# Patient Record
Sex: Female | Born: 1950
Health system: Southern US, Community
[De-identification: ages and names within clinical notes are randomized; demographics above are authoritative.]

## PROBLEM LIST (undated history)

## (undated) DIAGNOSIS — Z972 Presence of dental prosthetic device (complete) (partial): Secondary | ICD-10-CM

## (undated) DIAGNOSIS — I10 Essential (primary) hypertension: Secondary | ICD-10-CM

## (undated) DIAGNOSIS — K8020 Calculus of gallbladder without cholecystitis without obstruction: Secondary | ICD-10-CM

## (undated) DIAGNOSIS — K219 Gastro-esophageal reflux disease without esophagitis: Secondary | ICD-10-CM

## (undated) DIAGNOSIS — E785 Hyperlipidemia, unspecified: Secondary | ICD-10-CM

## (undated) DIAGNOSIS — E119 Type 2 diabetes mellitus without complications: Secondary | ICD-10-CM

## (undated) HISTORY — DX: Hyperlipidemia, unspecified: E78.5

## (undated) HISTORY — DX: Essential (primary) hypertension: I10

---

## 2007-11-12 ENCOUNTER — Emergency Department (HOSPITAL_COMMUNITY): Admission: EM | Admit: 2007-11-12 | Discharge: 2007-11-12 | Payer: Self-pay | Admitting: Emergency Medicine

## 2007-11-25 ENCOUNTER — Emergency Department (HOSPITAL_COMMUNITY): Admission: EM | Admit: 2007-11-25 | Discharge: 2007-11-26 | Payer: Self-pay | Admitting: Emergency Medicine

## 2008-01-03 ENCOUNTER — Encounter: Payer: Self-pay | Admitting: Internal Medicine

## 2008-01-17 ENCOUNTER — Encounter: Payer: Self-pay | Admitting: Internal Medicine

## 2008-02-10 ENCOUNTER — Ambulatory Visit: Payer: Self-pay | Admitting: Internal Medicine

## 2008-02-28 ENCOUNTER — Ambulatory Visit: Payer: Self-pay | Admitting: Internal Medicine

## 2009-01-11 ENCOUNTER — Ambulatory Visit (HOSPITAL_COMMUNITY): Admission: RE | Admit: 2009-01-11 | Discharge: 2009-01-11 | Payer: Self-pay | Admitting: Obstetrics and Gynecology

## 2009-01-11 ENCOUNTER — Encounter (INDEPENDENT_AMBULATORY_CARE_PROVIDER_SITE_OTHER): Payer: Self-pay | Admitting: Obstetrics and Gynecology

## 2009-01-11 HISTORY — PX: OTHER SURGICAL HISTORY: SHX169

## 2010-11-11 LAB — CBC
Hemoglobin: 13.6 g/dL (ref 12.0–15.0)
Platelets: 283 10*3/uL (ref 150–400)
RDW: 13.8 % (ref 11.5–15.5)
WBC: 8.8 10*3/uL (ref 4.0–10.5)

## 2010-11-11 LAB — BASIC METABOLIC PANEL
Calcium: 10.4 mg/dL (ref 8.4–10.5)
GFR calc non Af Amer: >60 mL/min (ref >60)
Glucose, Bld: 81 mg/dL (ref 70–99)
Potassium: 4.1 mEq/L (ref 3.5–5.1)
Sodium: 138 mEq/L (ref 135–145)

## 2010-12-17 NOTE — Op Note (Signed)
NAME:  Kimberly Welch, Kimberly Welch                 ACCOUNT NO.:  [id number]   MEDICAL RECORD NO.:  [id number]          PATIENT TYPE:  AMB   LOCATION:  SDC                           FACILITY:  [facility]   PHYSICIAN:  Hal Morales, M.D.DATE OF BIRTH:  1951-02-11   DATE OF PROCEDURE:  01/11/2009  DATE OF DISCHARGE:                               OPERATIVE REPORT   PREOPERATIVE DIAGNOSES:  Postmenopausal bleeding, endometrial polyps.   POSTOPERATIVE DIAGNOSES:  Postmenopausal bleeding, endometrial polyps.   OPERATIONS:  Hysteroscopy, resection of endometrial polyps, and uterine  curettage.   SURGEON:  Hal Morales, MD   ANESTHESIA:  General LMA and local.   ESTIMATED BLOOD LOSS:  Less than 10 mL.   COMPLICATIONS:  None.   FINDINGS:  The endometrial cavity was primarily atrophic; however, there  were 3 small endometrial polyps that were noted.   PROCEDURE IN DETAIL:  The patient was taken to the operating room after  appropriate identification and after placement of patient on the  operating table, general anesthesia was induced.  After that induction,  the patient was placed in lithotomy position and the perineum and vagina  were prepped with multiple layers of Betadine.  A red Robinson catheter  was used to empty the bladder and the perineum draped as a sterile  field.  A speculum was placed in the vagina and a paracervical block  achieved with a total of 10 mL of 2% Xylocaine in the 5 and 7 o'clock  positions.  The cervix was grasped with a tenaculum and the uterus  sounded to 9 cm.  The cervix was then dilated to accommodate the  diagnostic hysteroscope, which allowed visualization and documentation  of the above-noted findings.  The cervix was further dilated to  accommodate the operative hysteroscope which was then used to resect the  3 endometrial polyps.  The instruments were removed from the uterus and  a sharp curette was used to obtain minimal curettings.  The endometrial  cavity  was again visualized with the 3 endometrial polyps having been  resected.  All instruments were then removed from the endometrial cavity  and hemostasis noted to be adequate.  The patient was awakened from  general anesthesia and taken to the recovery room in satisfactory  condition having tolerated the procedure well with sponge and instrument  counts correct.   SPECIMENS TO PATHOLOGY:  Endometrial curettings and endometrial polyps.   DISCHARGE INSTRUCTIONS:  Printed instructions from [facility] for  [facility].   DISCHARGE MEDICATIONS:  Preoperative medications plus ibuprofen over-the-  counter 600 mg p.o. q.6 h. p.r.n.   FOLLOWUP INSTRUCTIONS:  The patient is to follow up in 2 weeks.      Hal Morales, M.D.  Electronically Signed     VPH/MEDQ  D:  01/11/2009  T:  01/12/2009  Job:  [id number]

## 2011-04-29 LAB — POCT I-STAT, CHEM 8
Calcium, Ion: 1.18
Chloride: 104
HCT: 40
Potassium: 3.8
Sodium: 141

## 2011-04-29 LAB — POCT CARDIAC MARKERS
CKMB, poc: <1 — ABNORMAL LOW
Myoglobin, poc: 46.9
Operator id: 282201
Operator id: 270651
Troponin i, poc: <0.05

## 2011-04-29 LAB — URINALYSIS, ROUTINE W REFLEX MICROSCOPIC
Bilirubin Urine: NEGATIVE
Hgb urine dipstick: NEGATIVE
Ketones, ur: NEGATIVE
Nitrite: NEGATIVE
Nitrite: NEGATIVE
Specific Gravity, Urine: 1.017
Urobilinogen, UA: 2 — ABNORMAL HIGH
pH: 8
pH: 6

## 2011-04-29 LAB — BASIC METABOLIC PANEL
BUN: 11
GFR calc non Af Amer: >60
Glucose, Bld: 131 — ABNORMAL HIGH
Potassium: 4.2

## 2011-04-29 LAB — DIFFERENTIAL
Basophils Absolute: 0
Basophils Absolute: 0.1
Basophils Relative: 0
Basophils Relative: 1
Eosinophils Absolute: 0.1
Eosinophils Relative: 1
Lymphocytes Relative: 32
Neutro Abs: 7.9 — ABNORMAL HIGH
Neutrophils Relative %: 72

## 2011-04-29 LAB — URINE MICROSCOPIC-ADD ON

## 2011-04-29 LAB — CBC
HCT: 37
MCHC: 33.1
MCV: 85.6
Platelets: 363
Platelets: 300
RDW: 13.9
RDW: 13.4

## 2012-08-31 ENCOUNTER — Telehealth: Payer: Self-pay | Admitting: Obstetrics and Gynecology

## 2012-10-25 ENCOUNTER — Other Ambulatory Visit: Payer: Self-pay | Admitting: Obstetrics and Gynecology

## 2012-10-26 LAB — PAP IG AND HPV HIGH-RISK

## 2014-04-29 ENCOUNTER — Encounter: Payer: Self-pay | Admitting: Internal Medicine

## 2014-07-04 ENCOUNTER — Encounter: Payer: Self-pay | Admitting: Internal Medicine

## 2014-07-04 ENCOUNTER — Other Ambulatory Visit (INDEPENDENT_AMBULATORY_CARE_PROVIDER_SITE_OTHER): Payer: BC Managed Care – PPO

## 2014-07-04 ENCOUNTER — Ambulatory Visit (INDEPENDENT_AMBULATORY_CARE_PROVIDER_SITE_OTHER): Payer: BC Managed Care – PPO | Admitting: Internal Medicine

## 2014-07-04 VITALS — BP 120/84 | HR 90 | Temp 97.5°F | Ht <= 58 in | Wt 156.0 lb

## 2014-07-04 DIAGNOSIS — E119 Type 2 diabetes mellitus without complications: Secondary | ICD-10-CM

## 2014-07-04 DIAGNOSIS — R002 Palpitations: Secondary | ICD-10-CM

## 2014-07-04 DIAGNOSIS — I10 Essential (primary) hypertension: Secondary | ICD-10-CM

## 2014-07-04 DIAGNOSIS — E669 Obesity, unspecified: Secondary | ICD-10-CM

## 2014-07-04 DIAGNOSIS — E1169 Type 2 diabetes mellitus with other specified complication: Secondary | ICD-10-CM

## 2014-07-04 DIAGNOSIS — E785 Hyperlipidemia, unspecified: Secondary | ICD-10-CM

## 2014-07-04 DIAGNOSIS — Z0000 Encounter for general adult medical examination without abnormal findings: Secondary | ICD-10-CM

## 2014-07-04 LAB — HEMOGLOBIN A1C: Hgb A1c MFr Bld: 6.7 % — ABNORMAL HIGH (ref 4.6–6.5)

## 2014-07-04 NOTE — Patient Instructions (Signed)
We have checked to her EKG, which is a test that shows [place] the heart. It did not show any signs of strain or stress on your heart.  We are also going to check blood work today to check on your diabetes and your cholesterol.  We will call you back with those results  We will see you back in about 6 months to check in on your health. If you have any problems or questions before that time please feel free to call our office.  Exercise to Lose Weight Exercise and a healthy diet may help you lose weight. Your doctor may suggest specific exercises. EXERCISE IDEAS AND TIPS  Choose low-cost things you enjoy doing, such as walking, bicycling, or exercising to workout videos.  Take stairs instead of the elevator.  Walk during your lunch break.  Park your car further away from work or school.  Go to a gym or an exercise class.  Start with 5 to 10 minutes of exercise each day. Build up to 30 minutes of exercise 4 to 6 days a week.  Wear shoes with good support and comfortable clothes.  Stretch before and after working out.  Work out until you breathe harder and your heart beats faster.  Drink extra water when you exercise.  Do not do so much that you hurt yourself, feel dizzy, or get very short of breath. Exercises that burn about 150 calories:  Running 1  miles in 15 minutes.  Playing volleyball for 45 to 60 minutes.  Washing and waxing a car for 45 to 60 minutes.  Playing touch football for 45 minutes.  Walking 1  miles in 35 minutes.  Pushing a stroller 1  miles in 30 minutes.  Playing basketball for 30 minutes.  Raking leaves for 30 minutes.  Bicycling 5 miles in 30 minutes.  Walking 2 miles in 30 minutes.  Dancing for 30 minutes.  Shoveling snow for 15 minutes.  Swimming laps for 20 minutes.  Walking up stairs for 15 minutes.  Bicycling 4 miles in 15 minutes.  Gardening for 30 to 45 minutes.  Jumping rope for 15 minutes.  Washing windows or floors for 45  to 60 minutes. Document Released: 08/23/2010 Document Revised: 10/13/2011 Document Reviewed: 08/23/2010 [facility] Patient Information 2015 [place], [place]. This information is not intended to replace advice given to you by your health care provider. Make sure you discuss any questions you have with your health care provider.

## 2014-07-04 NOTE — Progress Notes (Signed)
Pre visit review using our clinic review tool, if applicable. No additional management support is needed unless otherwise documented below in the visit note. 

## 2014-07-05 DIAGNOSIS — E785 Hyperlipidemia, unspecified: Secondary | ICD-10-CM

## 2014-07-05 DIAGNOSIS — Z1283 Encounter for screening for malignant neoplasm of skin: Secondary | ICD-10-CM | POA: Insufficient documentation

## 2014-07-05 DIAGNOSIS — Z0000 Encounter for general adult medical examination without abnormal findings: Secondary | ICD-10-CM | POA: Insufficient documentation

## 2014-07-05 DIAGNOSIS — E1169 Type 2 diabetes mellitus with other specified complication: Secondary | ICD-10-CM | POA: Insufficient documentation

## 2014-07-05 DIAGNOSIS — I152 Hypertension secondary to endocrine disorders: Secondary | ICD-10-CM | POA: Insufficient documentation

## 2014-07-05 DIAGNOSIS — R002 Palpitations: Secondary | ICD-10-CM

## 2014-07-05 DIAGNOSIS — E119 Type 2 diabetes mellitus without complications: Secondary | ICD-10-CM | POA: Insufficient documentation

## 2014-07-05 DIAGNOSIS — I10 Essential (primary) hypertension: Secondary | ICD-10-CM | POA: Insufficient documentation

## 2014-07-05 DIAGNOSIS — E669 Obesity, unspecified: Secondary | ICD-10-CM

## 2014-07-05 HISTORY — DX: Palpitations: R00.2

## 2014-07-05 LAB — BASIC METABOLIC PANEL
BUN: 18 mg/dL (ref 6–23)
CALCIUM: 9.5 mg/dL (ref 8.4–10.5)
CO2: 26 meq/L (ref 19–32)
CREATININE: 1 mg/dL (ref 0.4–1.2)
Chloride: 101 mEq/L (ref 96–112)
GFR: 71.81 mL/min (ref >60.00)
Glucose, Bld: 91 mg/dL (ref 70–99)
Potassium: 3.7 mEq/L (ref 3.5–5.1)
SODIUM: 136 meq/L (ref 135–145)

## 2014-07-05 LAB — LIPID PANEL
CHOL/HDL RATIO: 5
Cholesterol: 170 mg/dL (ref 0–200)
HDL: 35.9 mg/dL — AB (ref >39.00)
LDL CALC: 109 mg/dL — AB (ref 0–99)
NONHDL: 134.1
Triglycerides: 126 mg/dL (ref 0.0–149.0)
VLDL: 25.2 mg/dL (ref 0.0–40.0)

## 2014-07-05 NOTE — Assessment & Plan Note (Signed)
On ARB, currently taking metformin. Foot exam done today. Reminded about eye exam. Check hemoglobin A1c.

## 2014-07-05 NOTE — Assessment & Plan Note (Signed)
She is currently taking Pravachol and will check lipid panel today. May need to adjust therapy.

## 2014-07-05 NOTE — Progress Notes (Signed)
   Subjective:    Patient ID: Kimberly Welch, female    DOB: [birth year], 63 y.o.   MRN: [id number]  HPI The patient is a 62 old female comes in today to establish care. She does have asthma, history of hypertension, type 2 diabetes, GERD. She is doing well overall although is about to have some dental surgery done which is why she is currently taking doxycycline. She denies any fevers, chills, chest pain, shortness of breath, abdominal pain, constipation, diarrhea. She is overweight and feels that she needs to exercise more but isn't able to do it right now. She does occasionally get what she feels to be early beats. She does not have any pain associated with those and they do not come very often. They can come at rest or with activity and are not predictable.  Review of Systems  Constitutional: Negative for fever, activity change, appetite change, fatigue and unexpected weight change.  HENT: Negative.   Eyes: Negative.   Respiratory: Negative for cough, chest tightness, shortness of breath and wheezing.   Cardiovascular: Positive for palpitations. Negative for chest pain and leg swelling.  Gastrointestinal: Negative for nausea, abdominal pain, diarrhea, constipation and abdominal distention.  Musculoskeletal: Positive for arthralgias.  Skin: Negative.   Neurological: Negative.       Objective:   Physical Exam  Constitutional: She is oriented to person, place, and time. She appears well-developed and well-nourished.  HENT:  Head: Normocephalic and atraumatic.  Eyes: EOM are normal.  Neck: Normal range of motion.  Cardiovascular: Normal rate and regular rhythm.   No murmur heard. Pulmonary/Chest: Effort normal and breath sounds normal. No respiratory distress. She has no wheezes. She has no rales.  Abdominal: Soft. Bowel sounds are normal. She exhibits no distension. There is no tenderness. There is no rebound.  Musculoskeletal: She exhibits no edema or tenderness.  Neurological: She is  alert and oriented to person, place, and time. Coordination normal.  Skin: Skin is warm and dry.   Filed Vitals:   07/04/14 1452  BP: 120/84  Pulse: 90  Temp: 97.5 F (36.4 C)  TempSrc: Oral  Height: 4\' 10"  (1.473 m)  Weight: 156 lb (70.761 kg)  SpO2: 97%   EKG: Rate 85, rhythm normal. Occasional PAC. Intervals normal. Axis normal. No signs of ischemia or ST changes.    Assessment & Plan:

## 2014-07-05 NOTE — Assessment & Plan Note (Signed)
We'll need to check her previous immunization records so as not to duplicate therapies. Given flu shot today. Reminded about her eye exam. She is up-to-date on mammogram she is up-to-date on colonoscopy.

## 2014-07-05 NOTE — Assessment & Plan Note (Signed)
BP well-controlled on current valsartan HCTZ. Will check basic metabolic panel.

## 2014-07-05 NOTE — Assessment & Plan Note (Signed)
EKG done, revealed occasional PAC which are likely the palpitation she is having. No need for further workup at this time. If she is having frequent PACs can trial beta blocker for symptomatic relief.

## 2014-07-20 ENCOUNTER — Telehealth: Payer: Self-pay | Admitting: Internal Medicine

## 2014-07-20 ENCOUNTER — Other Ambulatory Visit: Payer: Self-pay | Admitting: Geriatric Medicine

## 2014-07-20 MED ORDER — METFORMIN HCL 500 MG PO TABS: 500.0000 mg | ORAL_TABLET | Freq: Every day | ORAL | Status: DC

## 2014-07-20 NOTE — Telephone Encounter (Signed)
Pt requesting refill Metformin, pharm told to call MD.  [facility] [address]

## 2014-07-20 NOTE — Telephone Encounter (Signed)
Sent to pharmacy 

## 2014-08-22 ENCOUNTER — Encounter: Payer: Self-pay | Admitting: Internal Medicine

## 2014-08-22 ENCOUNTER — Ambulatory Visit (INDEPENDENT_AMBULATORY_CARE_PROVIDER_SITE_OTHER): Payer: BLUE CROSS/BLUE SHIELD | Admitting: Internal Medicine

## 2014-08-22 VITALS — BP 162/92 | HR 96 | Temp 98.3°F | Resp 16 | Ht <= 58 in | Wt 155.8 lb

## 2014-08-22 DIAGNOSIS — W57XXXA Bitten or stung by nonvenomous insect and other nonvenomous arthropods, initial encounter: Secondary | ICD-10-CM

## 2014-08-22 DIAGNOSIS — L299 Pruritus, unspecified: Secondary | ICD-10-CM

## 2014-08-22 NOTE — Progress Notes (Signed)
Pre visit review using our clinic review tool, if applicable. No additional management support is needed unless otherwise documented below in the visit note. 

## 2014-08-22 NOTE — Patient Instructions (Signed)
We would like you to pick up the 5% lice cream today and use it. We will also send you to a skin doctor to see if they can help you with better answers.   Please use unscented lotions on your skin on the arms and legs as I do not think that there are any bugs there.

## 2014-08-22 NOTE — Progress Notes (Signed)
   Subjective:    Patient ID: Kimberly Welch, female    DOB: [birth year], 64 y.o.   MRN: [id number]  HPI The patient is a 64 YO female who is coming in with bugs times 3 weeks. She was out of town and thinks that she got it from a hotel. Since returning her son has had some bug bites and she was worried about bed bugs. She then thought that it spread to her bed and now thinks she has lice. She was seen at urgent care and treated but still thinks that she has bugs and lice. She thinks that she still has them and it has not worked. Denies fevers or chills.   Review of Systems  Constitutional: Negative for fever, activity change, appetite change, fatigue and unexpected weight change.  HENT: Negative.   Eyes: Negative.   Respiratory: Negative for cough, chest tightness, shortness of breath and wheezing.   Cardiovascular: Negative for chest pain and leg swelling.  Gastrointestinal: Negative for nausea, abdominal pain, diarrhea, constipation and abdominal distention.  Skin: Positive for color change.  Neurological: Negative.       Objective:   Physical Exam  Constitutional: She is oriented to person, place, and time. She appears well-developed and well-nourished.  HENT:  Head: Normocephalic and atraumatic.  Eyes: EOM are normal.  Neck: Normal range of motion.  Cardiovascular: Normal rate.   Pulmonary/Chest: Effort normal and breath sounds normal.  Abdominal: Soft. There is no tenderness.  Musculoskeletal: She exhibits no edema or tenderness.  Neurological: She is alert and oriented to person, place, and time. Coordination normal.  Skin: Skin is warm and dry.  Scalp examined without finding of larva, lice, bug, nits. Arms and legs examined and unable to see any bites that appear to be from bed bugs. No obvious bites on the skin.    Filed Vitals:   08/22/14 1628  BP: 162/92  Pulse: 96  Temp: 98.3 F (36.8 C)  TempSrc: Oral  Resp: 16  Height: 4\' 10"  (1.473 m)  Weight: 155 lb 12.8 oz  (70.67 kg)  SpO2: 97%      Assessment & Plan:

## 2014-08-23 DIAGNOSIS — W57XXXA Bitten or stung by nonvenomous insect and other nonvenomous arthropods, initial encounter: Secondary | ICD-10-CM

## 2014-08-23 HISTORY — DX: Bitten or stung by nonvenomous insect and other nonvenomous arthropods, initial encounter: W57.XXXA

## 2014-08-23 NOTE — Assessment & Plan Note (Signed)
Did not find any indication of bed bugs on exam, No evidence of lice or nits on her scalp. Reassured her that the cream appeared to have worked and that the harsh chemicals can cause itching and scalp flaking which may account for the small pieces of skin which she perceived to be bugs. She can wash her linens in hot water and use gentle soaps and lotions on her skin. She was not pleased with this answer and wanted referral to dermatology so this was placed for second opinion.

## 2014-09-04 ENCOUNTER — Telehealth: Payer: Self-pay | Admitting: Internal Medicine

## 2014-09-04 NOTE — Telephone Encounter (Signed)
Will not call in permetrin 5% and she can wait for the dermatologist visit.

## 2014-09-04 NOTE — Telephone Encounter (Signed)
Pt called in requesting to see if Dr Doug Sou could call in Permethrin 5%   [facility] [address]

## 2014-09-04 NOTE — Telephone Encounter (Signed)
Called pt no answer LMOM with md response.../lmb 

## 2014-09-13 ENCOUNTER — Other Ambulatory Visit: Payer: Self-pay | Admitting: Geriatric Medicine

## 2014-09-13 MED ORDER — METFORMIN HCL 500 MG PO TABS: 500.0000 mg | ORAL_TABLET | Freq: Every day | ORAL | Status: DC

## 2014-11-07 ENCOUNTER — Other Ambulatory Visit: Payer: Self-pay

## 2015-01-04 ENCOUNTER — Other Ambulatory Visit: Payer: Self-pay | Admitting: Internal Medicine

## 2015-01-05 ENCOUNTER — Other Ambulatory Visit: Payer: Self-pay

## 2015-01-09 ENCOUNTER — Other Ambulatory Visit (INDEPENDENT_AMBULATORY_CARE_PROVIDER_SITE_OTHER): Payer: Federal, State, Local not specified - PPO

## 2015-01-09 ENCOUNTER — Encounter: Payer: Self-pay | Admitting: Internal Medicine

## 2015-01-09 ENCOUNTER — Ambulatory Visit (INDEPENDENT_AMBULATORY_CARE_PROVIDER_SITE_OTHER): Payer: Federal, State, Local not specified - PPO | Admitting: Internal Medicine

## 2015-01-09 VITALS — BP 140/82 | HR 84 | Temp 98.3°F | Resp 12 | Wt 147.8 lb

## 2015-01-09 DIAGNOSIS — E119 Type 2 diabetes mellitus without complications: Secondary | ICD-10-CM | POA: Diagnosis not present

## 2015-01-09 DIAGNOSIS — E1169 Type 2 diabetes mellitus with other specified complication: Secondary | ICD-10-CM

## 2015-01-09 DIAGNOSIS — E669 Obesity, unspecified: Secondary | ICD-10-CM | POA: Diagnosis not present

## 2015-01-09 DIAGNOSIS — W57XXXA Bitten or stung by nonvenomous insect and other nonvenomous arthropods, initial encounter: Secondary | ICD-10-CM

## 2015-01-09 DIAGNOSIS — T148 Other injury of unspecified body region: Secondary | ICD-10-CM

## 2015-01-09 LAB — BASIC METABOLIC PANEL
BUN: 17 mg/dL (ref 6–23)
CO2: 30 mEq/L (ref 19–32)
CREATININE: 0.91 mg/dL (ref 0.40–1.20)
Calcium: 10.2 mg/dL (ref 8.4–10.5)
Chloride: 99 mEq/L (ref 96–112)
GFR: 79.94 mL/min (ref >60.00)
Glucose, Bld: 103 mg/dL — ABNORMAL HIGH (ref 70–99)
Potassium: 4 mEq/L (ref 3.5–5.1)
SODIUM: 136 meq/L (ref 135–145)

## 2015-01-09 LAB — HEMOGLOBIN A1C: HEMOGLOBIN A1C: 6.1 % (ref 4.6–6.5)

## 2015-01-09 MED ORDER — IVERMECTIN 0.5 % EX LOTN: TOPICAL_LOTION | CUTANEOUS | Status: DC

## 2015-01-09 MED ORDER — PRAVASTATIN SODIUM 20 MG PO TABS: 20.0000 mg | ORAL_TABLET | Freq: Every day | ORAL | Status: DC

## 2015-01-09 NOTE — Progress Notes (Signed)
Pre visit review using our clinic review tool, if applicable. No additional management support is needed unless otherwise documented below in the visit note. 

## 2015-01-09 NOTE — Patient Instructions (Signed)
We have sent in the cream which you can use in your hair and scalp. Apply, leave for 10 minutes, then rinse.   We will also check some blood work today for the diabetes to make sure that is is still well controlled.   Diabetes and Exercise Exercising regularly is important. It is not just about losing weight. It has many health benefits, such as:  Improving your overall fitness, flexibility, and endurance.  Increasing your bone density.  Helping with weight control.  Decreasing your body fat.  Increasing your muscle strength.  Reducing stress and tension.  Improving your overall health. People with diabetes who exercise gain additional benefits because exercise:  Reduces appetite.  Improves the body's use of blood sugar (glucose).  Helps lower or control blood glucose.  Decreases blood pressure.  Helps control blood lipids (such as cholesterol and triglycerides).  Improves the body's use of the hormone insulin by:  Increasing the body's insulin sensitivity.  Reducing the body's insulin needs.  Decreases the risk for heart disease because exercising:  Lowers cholesterol and triglycerides levels.  Increases the levels of good cholesterol (such as high-density lipoproteins [HDL]) in the body.  Lowers blood glucose levels. YOUR ACTIVITY PLAN  Choose an activity that you enjoy and set realistic goals. Your health care provider or diabetes educator can help you make an activity plan that works for you. Exercise regularly as directed by your health care provider. This includes:  Performing resistance training twice a week such as push-ups, sit-ups, lifting weights, or using resistance bands.  Performing 150 minutes of cardio exercises each week such as walking, running, or playing sports.  Staying active and spending no more than 90 minutes at one time being inactive. Even short bursts of exercise are good for you. Three 10-minute sessions spread throughout the day are  just as beneficial as a single 30-minute session. Some exercise ideas include:  Taking the dog for a walk.  Taking the stairs instead of the elevator.  Dancing to your favorite song.  Doing an exercise video.  Doing your favorite exercise with a friend. RECOMMENDATIONS FOR EXERCISING WITH TYPE 1 OR TYPE 2 DIABETES   Check your blood glucose before exercising. If blood glucose levels are greater than 240 mg/dL, check for urine ketones. Do not exercise if ketones are present.  Avoid injecting insulin into areas of the body that are going to be exercised. For example, avoid injecting insulin into:  The arms when playing tennis.  The legs when jogging.  Keep a record of:  Food intake before and after you exercise.  Expected peak times of insulin action.  Blood glucose levels before and after you exercise.  The type and amount of exercise you have done.  Review your records with your health care provider. Your health care provider will help you to develop guidelines for adjusting food intake and insulin amounts before and after exercising.  If you take insulin or oral hypoglycemic agents, watch for signs and symptoms of hypoglycemia. They include:  Dizziness.  Shaking.  Sweating.  Chills.  Confusion.  Drink plenty of water while you exercise to prevent dehydration or heat stroke. Body water is lost during exercise and must be replaced.  Talk to your health care provider before starting an exercise program to make sure it is safe for you. Remember, almost any type of activity is better than none. Document Released: 10/11/2003 Document Revised: 12/05/2013 Document Reviewed: 12/28/2012 [facility] Patient Information 2015 [place], [place]. This information is not  intended to replace advice given to you by your health care provider. Make sure you discuss any questions you have with your health care provider.

## 2015-01-10 NOTE — Progress Notes (Signed)
   Subjective:    Patient ID: Kimberly Welch, female    DOB: 03/06/1951, 64 y.o.   MRN: [id number]  HPI The patient is a 64 YO female who is coming in for follow up of her diabetes. She is still taking her metformin without side effects. Denies hypoglycemia. Not exercising well. She is still very concerned about bed bugs. She has gotten an Conservation officer, nature and been to dermatologist several times and still thinks that she is having bugs on her pillow. She has brought a sample in with her today. No bites on her person but she thinks that they are in her hair and she wants to eradicate thinking that this is where they are coming from.   Review of Systems  Constitutional: Negative for fever, activity change, appetite change, fatigue and unexpected weight change.  HENT: Negative.   Respiratory: Negative for cough, chest tightness, shortness of breath and wheezing.   Cardiovascular: Negative for chest pain and leg swelling.  Gastrointestinal: Negative for nausea, abdominal pain, diarrhea, constipation and abdominal distention.  Neurological: Negative.       Objective:   Physical Exam  Constitutional: She is oriented to person, place, and time. She appears well-developed and well-nourished.  HENT:  Head: Normocephalic and atraumatic.  Eyes: EOM are normal.  Neck: Normal range of motion.  Cardiovascular: Normal rate.   Pulmonary/Chest: Effort normal and breath sounds normal.  Abdominal: Soft. There is no tenderness.  Musculoskeletal: She exhibits no edema or tenderness.  Neurological: She is alert and oriented to person, place, and time. Coordination normal.  Skin: Skin is warm and dry.  Scalp examined without finding of larva, lice, bug, nits. No obvious bites on the skin. "bug" that she brought in appears to be small dark something but not convincingly a bed bug.    Filed Vitals:   01/09/15 1002  BP: 140/82  Pulse: 84  Temp: 98.3 F (36.8 C)  TempSrc: Oral  Resp: 12  Weight: 147 lb 12.8 oz  (67.042 kg)  SpO2: 97%      Assessment & Plan:

## 2015-01-10 NOTE — Assessment & Plan Note (Signed)
Not exercising due to her concerns about the bugs. Checking HgA1c today and BMP. Continue metformin and she is on ARB and statin. No complications noted at this time. Reminded about yearly eye exam.

## 2015-01-10 NOTE — Assessment & Plan Note (Signed)
No bites on her person and no convincing evidence for infestation on her scalp. Will rx ivermectin lotion for her scalp but have reassured her that the thing she brought in is not a bug and that likely the exterminator did a good job. Her mattress is wrapped at home and she has washed all linens. This is a fear of hers that is keeping her from going out with her friends and reassured her that this is possible. She is not happy with this and advised her that she can return to her dermatologist for a second opinion and she wishes to do that.

## 2015-02-23 ENCOUNTER — Telehealth: Payer: Self-pay | Admitting: Geriatric Medicine

## 2015-02-23 NOTE — Telephone Encounter (Signed)
Left message asking patient to call me back to let me know if she has had a mammogram recently.  

## 2015-09-21 DIAGNOSIS — Z7984 Long term (current) use of oral hypoglycemic drugs: Secondary | ICD-10-CM | POA: Diagnosis not present

## 2015-09-21 DIAGNOSIS — E785 Hyperlipidemia, unspecified: Secondary | ICD-10-CM | POA: Diagnosis not present

## 2015-09-21 DIAGNOSIS — K8020 Calculus of gallbladder without cholecystitis without obstruction: Secondary | ICD-10-CM | POA: Insufficient documentation

## 2015-09-21 DIAGNOSIS — I10 Essential (primary) hypertension: Secondary | ICD-10-CM | POA: Insufficient documentation

## 2015-09-21 DIAGNOSIS — R1013 Epigastric pain: Secondary | ICD-10-CM | POA: Diagnosis not present

## 2015-09-21 DIAGNOSIS — E119 Type 2 diabetes mellitus without complications: Secondary | ICD-10-CM | POA: Diagnosis not present

## 2015-09-21 DIAGNOSIS — R740 Nonspecific elevation of levels of transaminase and lactic acid dehydrogenase [LDH]: Secondary | ICD-10-CM | POA: Diagnosis not present

## 2015-09-21 DIAGNOSIS — R945 Abnormal results of liver function studies: Secondary | ICD-10-CM | POA: Insufficient documentation

## 2015-09-21 DIAGNOSIS — Z79899 Other long term (current) drug therapy: Secondary | ICD-10-CM | POA: Diagnosis not present

## 2015-09-21 DIAGNOSIS — K8050 Calculus of bile duct without cholangitis or cholecystitis without obstruction: Secondary | ICD-10-CM | POA: Diagnosis not present

## 2015-09-22 ENCOUNTER — Encounter (HOSPITAL_COMMUNITY): Payer: Self-pay | Admitting: *Deleted

## 2015-09-22 ENCOUNTER — Emergency Department (HOSPITAL_COMMUNITY)
Admission: EM | Admit: 2015-09-22 | Discharge: 2015-09-22 | Disposition: A | Discharge status: Home / Self Care | Payer: Medicare Other | Attending: Emergency Medicine | Admitting: Emergency Medicine

## 2015-09-22 ENCOUNTER — Emergency Department (HOSPITAL_COMMUNITY): Payer: Medicare Other

## 2015-09-22 DIAGNOSIS — R7401 Elevation of levels of liver transaminase levels: Secondary | ICD-10-CM

## 2015-09-22 DIAGNOSIS — R109 Unspecified abdominal pain: Secondary | ICD-10-CM

## 2015-09-22 DIAGNOSIS — K8050 Calculus of bile duct without cholangitis or cholecystitis without obstruction: Secondary | ICD-10-CM

## 2015-09-22 DIAGNOSIS — K8020 Calculus of gallbladder without cholecystitis without obstruction: Secondary | ICD-10-CM | POA: Diagnosis not present

## 2015-09-22 DIAGNOSIS — R740 Nonspecific elevation of levels of transaminase and lactic acid dehydrogenase [LDH]: Secondary | ICD-10-CM

## 2015-09-22 LAB — COMPREHENSIVE METABOLIC PANEL
ALBUMIN: 4.2 g/dL (ref 3.5–5.0)
ALK PHOS: 122 U/L (ref 38–126)
ALT: 74 U/L — ABNORMAL HIGH (ref 14–54)
AST: 265 U/L — AB (ref 15–41)
Anion gap: 14 (ref 5–15)
BILIRUBIN TOTAL: 0.9 mg/dL (ref 0.3–1.2)
BUN: 13 mg/dL (ref 6–20)
CALCIUM: 10 mg/dL (ref 8.9–10.3)
CO2: 24 mmol/L (ref 22–32)
Chloride: 101 mmol/L (ref 101–111)
Creatinine, Ser: 0.98 mg/dL (ref 0.44–1.00)
GFR calc Af Amer: >60 mL/min (ref >60)
GFR, EST NON AFRICAN AMERICAN: 59 mL/min — AB (ref >60)
GLUCOSE: 136 mg/dL — AB (ref 65–99)
POTASSIUM: 3.8 mmol/L (ref 3.5–5.1)
Sodium: 139 mmol/L (ref 135–145)
TOTAL PROTEIN: 7.6 g/dL (ref 6.5–8.1)

## 2015-09-22 LAB — URINALYSIS, ROUTINE W REFLEX MICROSCOPIC
Glucose, UA: NEGATIVE mg/dL
Hgb urine dipstick: NEGATIVE
KETONES UR: NEGATIVE mg/dL
LEUKOCYTES UA: NEGATIVE
NITRITE: NEGATIVE
PROTEIN: NEGATIVE mg/dL
Specific Gravity, Urine: 1.026 (ref 1.005–1.030)
pH: 5 (ref 5.0–8.0)

## 2015-09-22 LAB — CBC
HCT: 38.2 % (ref 36.0–46.0)
Hemoglobin: 12.2 g/dL (ref 12.0–15.0)
MCH: 27.6 pg (ref 26.0–34.0)
MCHC: 31.9 g/dL (ref 30.0–36.0)
MCV: 86.4 fL (ref 78.0–100.0)
PLATELETS: 340 10*3/uL (ref 150–400)
RBC: 4.42 MIL/uL (ref 3.87–5.11)
RDW: 13.6 % (ref 11.5–15.5)
WBC: 9.6 10*3/uL (ref 4.0–10.5)

## 2015-09-22 LAB — LIPASE, BLOOD: Lipase: 41 U/L (ref 11–51)

## 2015-09-22 LAB — TROPONIN I

## 2015-09-22 MED ORDER — OXYCODONE-ACETAMINOPHEN 5-325 MG PO TABS: 1.0000 | ORAL_TABLET | Freq: Four times a day (QID) | ORAL | Status: DC | PRN

## 2015-09-22 MED ORDER — GI COCKTAIL ~~LOC~~
30.0000 mL | Freq: Once | ORAL | Status: AC
  Administered 2015-09-22: 30 mL via ORAL
  Filled 2015-09-22: qty 30

## 2015-09-22 MED ORDER — ONDANSETRON 4 MG PO TBDP
4.0000 mg | ORAL_TABLET | Freq: Once | ORAL | Status: AC
  Administered 2015-09-22: 4 mg via ORAL
  Filled 2015-09-22: qty 1

## 2015-09-22 MED ORDER — ONDANSETRON HCL 4 MG/2ML IJ SOLN
4.0000 mg | Freq: Once | INTRAMUSCULAR | Status: AC
  Administered 2015-09-22: 4 mg via INTRAVENOUS
  Filled 2015-09-22: qty 2

## 2015-09-22 MED ORDER — MORPHINE SULFATE (PF) 4 MG/ML IV SOLN
4.0000 mg | Freq: Once | INTRAVENOUS | Status: AC
  Administered 2015-09-22: 4 mg via INTRAVENOUS
  Filled 2015-09-22: qty 1

## 2015-09-22 MED ORDER — ONDANSETRON 4 MG PO TBDP: 4.0000 mg | ORAL_TABLET | Freq: Three times a day (TID) | ORAL | Status: DC | PRN

## 2015-09-22 NOTE — ED Provider Notes (Signed)
CSN: [id number]     Arrival date & time 09/21/15  2349 History  By signing my name below, I, Kimberly Welch, attest that this documentation has been prepared under the direction and in the presence of Merryl Hacker, MD. Electronically Signed: Irene Welch, ED Scribe. 09/22/2015. 1:07 AM.  Chief Complaint  Patient presents with  . Abdominal Pain   The history is provided by the patient. No language interpreter was used.   HPI Comments: Kimberly Welch is a 65 y.o. Female with a hx of DM, HTN, and hyperlipidemia who presents to the Emergency Department complaining of gradually worsening, burning, epigastric abdominal pain that radiates to her back onset 6 hours ago. She states that her pain first started after eating dinner. She reports associated mild emesis. She currently rates her pain 6/10. She states that she thought it was indigestion, but took Tums and Alka Seltzer to no relief. She reports hx of similar pain but states that it never lasts this long with her indigestion. Pt takes metformin and valsartan daily. She denies diarrhea.   Past Medical History  Diagnosis Date  . Diabetes mellitus without complication ([place])   . Hypertension   . Hyperlipidemia    History reviewed. No pertinent past surgical history. No family history on file. Social History  Substance Use Topics  . Smoking status: Never Smoker   . Smokeless tobacco: Never Used  . Alcohol Use: 0.0 oz/week    0 Standard drinks or equivalent per week   OB History    No data available     Review of Systems  Constitutional: Negative for fever.  Respiratory: Negative for shortness of breath.   Cardiovascular: Negative for chest pain.  Gastrointestinal: Positive for vomiting and abdominal pain. Negative for diarrhea.  All other systems reviewed and are negative.  Allergies  Sulfa antibiotics  Home Medications   Prior to Admission medications   Medication Sig Start Date End Date Taking? Authorizing Provider   metFORMIN (GLUCOPHAGE) 500 MG tablet Take 1 tablet (500 mg total) by mouth daily with breakfast. 09/13/14  Yes Hoyt Koch, MD  omeprazole (PRILOSEC) 20 MG capsule Take 20 mg by mouth daily. OTC   Yes Historical Provider, MD  pravastatin (PRAVACHOL) 20 MG tablet Take 1 tablet (20 mg total) by mouth daily. 01/09/15  Yes Hoyt Koch, MD  valsartan-hydrochlorothiazide (DIOVAN-HCT) 80-12.5 MG per tablet Take 1 tablet by mouth daily. 01/05/15  Yes Hoyt Koch, MD  ondansetron (ZOFRAN ODT) 4 MG disintegrating tablet Take 1 tablet (4 mg total) by mouth every 8 (eight) hours as needed for nausea or vomiting. 09/22/15   Merryl Hacker, MD  oxyCODONE-acetaminophen (PERCOCET/ROXICET) 5-325 MG tablet Take 1 tablet by mouth every 6 (six) hours as needed for severe pain. 09/22/15   Merryl Hacker, MD   BP 159/84 mmHg  Pulse 94  Temp(Src) 97.6 F (36.4 C)  Resp 21  Ht 4\' 11"  (1.499 m)  Wt 147 lb 7 oz (66.877 kg)  BMI 29.76 kg/m2  SpO2 99%  LMP 08/05/2003 Physical Exam  Constitutional: She is oriented to person, place, and time. She appears well-developed and well-nourished. No distress.  HENT:  Head: Normocephalic and atraumatic.  Cardiovascular: Normal rate, regular rhythm and normal heart sounds.   No murmur heard. Pulmonary/Chest: Effort normal and breath sounds normal. No respiratory distress. She has no wheezes.  Abdominal: Soft. Bowel sounds are normal. There is tenderness. There is no rebound and no guarding.  Epigastric tenderness to palpation without  rebound or guarding  Neurological: She is alert and oriented to person, place, and time.  Skin: Skin is warm and dry.  Psychiatric: She has a normal mood and affect.  Nursing note and vitals reviewed.   ED Course  Procedures (including critical care time) DIAGNOSTIC STUDIES: Oxygen Saturation is 99% on RA, normal by my interpretation.    COORDINATION OF CARE: 12:56 AM-Discussed treatment plan which includes  labs with pt at bedside and pt agreed to plan.    Labs Review Labs Reviewed  COMPREHENSIVE METABOLIC PANEL - Abnormal; Notable for the following:    Glucose, Bld 136 (*)    AST 265 (*)    ALT 74 (*)    GFR calc non Af Amer 59 (*)    All other components within normal limits  URINALYSIS, ROUTINE W REFLEX MICROSCOPIC (NOT AT [facility]) - Abnormal; Notable for the following:    Color, Urine AMBER (*)    APPearance CLOUDY (*)    Bilirubin Urine SMALL (*)    All other components within normal limits  LIPASE, BLOOD  CBC  TROPONIN I    Imaging Review US Abdomen Limited Ruq  09/22/2015  CLINICAL DATA:  Abdominal pain for 6 hours. Nausea and vomiting. No relief with home medications. EXAM: US ABDOMEN LIMITED - RIGHT UPPER QUADRANT COMPARISON:  None. FINDINGS: Gallbladder: Several stones and sludge in the dependent portion of the gallbladder. Largest stone measures about 2.7 cm diameter. No wall thickening. Murphy's sign is negative. Common bile duct: Diameter: 5.3-8.5 mm. Maximal measured diameter is mildly enlarged. This is likely within normal limits for patient's age. Liver: Diffusely increased echotexture throughout the liver consistent with fatty infiltration. Area of focal sparing demonstrated around the gallbladder fossa. IMPRESSION: Cholelithiasis and sludge within the gallbladder. Fatty infiltration of the liver. Electronically Signed   By: Lucienne Capers M.D.   On: 09/22/2015 02:27   I have personally reviewed and evaluated these images and lab results as part of my medical decision-making.   EKG Interpretation   Date/Time:  Saturday September 22 2015 00:16:18 EST Ventricular Rate:  88 PR Interval:  190 QRS Duration: 72 QT Interval:  364 QTC Calculation: 440 R Axis:   23 Text Interpretation:  Normal sinus rhythm Low voltage QRS Cannot rule out  Anterior infarct , age undetermined Abnormal ECG No significant change  since last tracing Confirmed by Erika Slaby  MD, Loma Sousa ([id number]) on  09/22/2015  12:44:34 AM      MDM   Final diagnoses:  Abdominal pain  Biliary colic  Elevated AST (SGOT)    Patient presents with epigastric pain. Nontoxic on exam. Afebrile and vital signs are reassuring.  Considerations include GERD versus peptic ulcer disease versus gallbladder pathology, versus pancreatitis. Patient was initially given a GI cocktail with minimal improvement of symptoms. She was subsequently given morphine and Zofran as well as fluids. Lab work obtained. Mild elevation in AST and ALT. No leukocytosis. Will obtain right upper quadrant ultrasound given minimally elevated liver function tests. Right upper quadrant ultrasound with evidence of cholelithiasis and sludge. No evidence of cholecystitis. On recheck, patient reports improvement of abdominal pain with pain management. She still has mild tenderness in the right upper quadrant and epigastrium but no evidence of peritonitis. She was able to tolerate fluids. Discussed with patient that her symptoms were likely gallbladder related. She needs to follow-up with general surgery. Given that she has no evidence of cholecystitis at this time without leukocytosis or fever, feel patient can follow-up as an  outpatient. She does need repeat liver function tests to monitor for increasing levels. She will also need surgery follow-up. Patient and her husband were advised of strict return precautions including worsening pain, fever, vomiting. Patient stated understanding.  After history, exam, and medical workup I feel the patient has been appropriately medically screened and is safe for discharge home. Pertinent diagnoses were discussed with the patient. Patient was given return precautions.  I personally performed the services described in this documentation, which was scribed in my presence. The recorded information has been reviewed and is accurate.    Merryl Hacker, MD 09/22/15 [id number]

## 2015-09-22 NOTE — Discharge Instructions (Signed)
You were seen today for abdominal pain. This is likely related to her gallbladder. At this time there is no evidence of acute infection or inflammation of her gallbladder; however, you may need to have your gallbladder removed electively. Your liver function chest were mildly elevated. He need to follow-up with her primary physician for recheck in one week. You also need to follow-up with general surgery for further evaluation. If you have new or worsening pain, fever, or any new or worsening symptoms she needs to be reevaluated immediately.  Biliary Colic Biliary colic is a pain in the upper abdomen. The pain:  Is usually felt on the right side of the abdomen, but it may also be felt in the center of the abdomen, just below the breastbone (sternum).  May spread back toward the right shoulder blade.  May be steady or irregular.  May be accompanied by nausea and vomiting. Most of the time, the pain goes away in 1-5 hours. After the most intense pain passes, the abdomen may continue to ache mildly for about 24 hours. Biliary colic is caused by a blockage in the bile duct. The bile duct is a pathway that carries bile--a liquid that helps to digest fats--from the gallbladder to the small intestine. Biliary colic usually occurs after eating, when the digestive system demands bile. The pain develops when muscle cells contract forcefully to try to move the blockage so that bile can get by. HOME CARE INSTRUCTIONS  Take medicines only as directed by your health care provider.  Drink enough fluid to keep your urine clear or pale yellow.  Avoid fatty, greasy, and fried foods. These kinds of foods increase your body's demand for bile.  Avoid any foods that make your pain worse.  Avoid overeating.  Avoid having a large meal after fasting. SEEK MEDICAL CARE IF:  You develop a fever.  Your pain gets worse.  You vomit.  You develop nausea that prevents you from eating and drinking. SEEK IMMEDIATE  MEDICAL CARE IF:  You suddenly develop a fever and shaking chills.  You develop a yellowish discoloration (jaundice) of:  Skin.  Whites of the eyes.  Mucous membranes.  You have continuous or severe pain that is not relieved with medicines.  You have nausea and vomiting that is not relieved with medicines.  You develop dizziness or you faint.   This information is not intended to replace advice given to you by your health care provider. Make sure you discuss any questions you have with your health care provider.   Document Released: 12/22/2005 Document Revised: 12/05/2014 Document Reviewed: 05/02/2014 Elsevier Interactive Patient Education [employer].

## 2015-09-22 NOTE — ED Notes (Signed)
Patient left at this time with all belongings. 

## 2015-09-22 NOTE — ED Notes (Signed)
Pt c/o epigastric pain for 5 hours  No relief after taking meds.

## 2015-09-25 ENCOUNTER — Ambulatory Visit: Payer: Federal, State, Local not specified - PPO | Admitting: Family

## 2015-10-08 ENCOUNTER — Other Ambulatory Visit (INDEPENDENT_AMBULATORY_CARE_PROVIDER_SITE_OTHER): Payer: Medicare Other

## 2015-10-08 ENCOUNTER — Ambulatory Visit (INDEPENDENT_AMBULATORY_CARE_PROVIDER_SITE_OTHER): Payer: Medicare Other | Admitting: Internal Medicine

## 2015-10-08 ENCOUNTER — Encounter: Payer: Self-pay | Admitting: Internal Medicine

## 2015-10-08 VITALS — BP 138/80 | HR 84 | Temp 97.8°F | Resp 14 | Ht 58.5 in | Wt 147.4 lb

## 2015-10-08 DIAGNOSIS — K8020 Calculus of gallbladder without cholecystitis without obstruction: Secondary | ICD-10-CM

## 2015-10-08 DIAGNOSIS — R7989 Other specified abnormal findings of blood chemistry: Secondary | ICD-10-CM | POA: Diagnosis not present

## 2015-10-08 DIAGNOSIS — R740 Nonspecific elevation of levels of transaminase and lactic acid dehydrogenase [LDH]: Principal | ICD-10-CM

## 2015-10-08 DIAGNOSIS — R945 Abnormal results of liver function studies: Secondary | ICD-10-CM

## 2015-10-08 DIAGNOSIS — E119 Type 2 diabetes mellitus without complications: Secondary | ICD-10-CM

## 2015-10-08 DIAGNOSIS — E1169 Type 2 diabetes mellitus with other specified complication: Secondary | ICD-10-CM

## 2015-10-08 DIAGNOSIS — E669 Obesity, unspecified: Secondary | ICD-10-CM

## 2015-10-08 DIAGNOSIS — R7401 Elevation of levels of liver transaminase levels: Secondary | ICD-10-CM

## 2015-10-08 DIAGNOSIS — R799 Abnormal finding of blood chemistry, unspecified: Secondary | ICD-10-CM | POA: Diagnosis not present

## 2015-10-08 LAB — COMPREHENSIVE METABOLIC PANEL
ALK PHOS: 80 U/L (ref 39–117)
ALT: 9 U/L (ref 0–35)
AST: 16 U/L (ref 0–37)
Albumin: 4.6 g/dL (ref 3.5–5.2)
BILIRUBIN TOTAL: 0.4 mg/dL (ref 0.2–1.2)
BUN: 14 mg/dL (ref 6–23)
CALCIUM: 10.4 mg/dL (ref 8.4–10.5)
CO2: 30 meq/L (ref 19–32)
Chloride: 100 mEq/L (ref 96–112)
Creatinine, Ser: 0.91 mg/dL (ref 0.40–1.20)
GFR: 79.76 mL/min (ref >60.00)
Glucose, Bld: 104 mg/dL — ABNORMAL HIGH (ref 70–99)
Potassium: 3.9 mEq/L (ref 3.5–5.1)
Sodium: 138 mEq/L (ref 135–145)
TOTAL PROTEIN: 7.5 g/dL (ref 6.0–8.3)

## 2015-10-08 LAB — HEMOGLOBIN A1C: Hgb A1c MFr Bld: 6.4 % (ref 4.6–6.5)

## 2015-10-08 NOTE — Patient Instructions (Addendum)
We will check the labs today and call you with the results.   We will also get you in with the surgeon to talk to them about surgery.   Cholelithiasis Cholelithiasis (also called gallstones) is a form of gallbladder disease in which gallstones form in your gallbladder. The gallbladder is an organ that stores bile made in the liver, which helps digest fats. Gallstones begin as small crystals and slowly grow into stones. Gallstone pain occurs when the gallbladder spasms and a gallstone is blocking the duct. Pain can also occur when a stone passes out of the duct.  RISK FACTORS  Being female.   Having multiple pregnancies. Health care providers sometimes advise removing diseased gallbladders before future pregnancies.   Being obese.  Eating a diet heavy in fried foods and fat.   Being older than 22 years and increasing age.   Prolonged use of medicines containing female hormones.   Having diabetes mellitus.   Rapidly losing weight.   Having a family history of gallstones (heredity).  SYMPTOMS  Nausea.   Vomiting.  Abdominal pain.   Yellowing of the skin (jaundice).   Sudden pain. It may persist from several minutes to several hours.  Fever.   Tenderness to the touch. In some cases, when gallstones do not move into the bile duct, people have no pain or symptoms. These are called "silent" gallstones.  TREATMENT Silent gallstones do not need treatment. In severe cases, emergency surgery may be required. Options for treatment include:  Surgery to remove the gallbladder. This is the most common treatment.  Medicines. These do not always work and may take 6-12 months or more to work.  Shock wave treatment (extracorporeal biliary lithotripsy). In this treatment an ultrasound machine sends shock waves to the gallbladder to break gallstones into smaller pieces that can pass into the intestines or be dissolved by medicine. HOME CARE INSTRUCTIONS   Only take  over-the-counter or prescription medicines for pain, discomfort, or fever as directed by your health care provider.   Follow a low-fat diet until seen again by your health care provider. Fat causes the gallbladder to contract, which can result in pain.   Follow up with your health care provider as directed. Attacks are almost always recurrent and surgery is usually required for permanent treatment.  SEEK IMMEDIATE MEDICAL CARE IF:   Your pain increases and is not controlled by medicines.   You have a fever or persistent symptoms for more than 2-3 days.   You have a fever and your symptoms suddenly get worse.   You have persistent nausea and vomiting.  MAKE SURE YOU:   Understand these instructions.  Will watch your condition.  Will get help right away if you are not doing well or get worse.   This information is not intended to replace advice given to you by your health care provider. Make sure you discuss any questions you have with your health care provider.   Document Released: 07/17/2005 Document Revised: 03/23/2013 Document Reviewed: 01/12/2013 Elsevier Interactive Patient Education [employer].

## 2015-10-08 NOTE — Progress Notes (Signed)
Pre visit review using our clinic review tool, if applicable. No additional management support is needed unless otherwise documented below in the visit note. 

## 2015-10-09 DIAGNOSIS — R945 Abnormal results of liver function studies: Secondary | ICD-10-CM

## 2015-10-09 DIAGNOSIS — R7989 Other specified abnormal findings of blood chemistry: Secondary | ICD-10-CM | POA: Insufficient documentation

## 2015-10-09 DIAGNOSIS — K8020 Calculus of gallbladder without cholecystitis without obstruction: Secondary | ICD-10-CM | POA: Insufficient documentation

## 2015-10-09 LAB — HEPATITIS PANEL, ACUTE
HCV AB: NEGATIVE
HEP B C IGM: NONREACTIVE
Hep A IgM: NONREACTIVE
Hepatitis B Surface Ag: NEGATIVE

## 2015-10-09 LAB — HEPATITIS B SURFACE ANTIBODY,QUALITATIVE: Hep B S Ab: NEGATIVE

## 2015-10-09 NOTE — Progress Notes (Signed)
   Subjective:    Patient ID: Kimberly Welch, female    DOB: 1951/03/09, 65 y.o.   MRN: [id number]  HPI The patient is a 65 YO female coming in for follow up of ER visit (diagnosed with gallstones and elevated liver function tests). She has been having some symptoms of pain and nausea off and on for some time. This episode was severe which is why she went to the ER. She has radically changed her diet to help with the pain and has had less episodes. She is not eating any fatty foods or carbs. She has had mild episodes but nothing severe. Wants to get evaluation for gallbladder removal.   Review of Systems  Constitutional: Negative for fever, activity change, appetite change, fatigue and unexpected weight change.  HENT: Negative.   Respiratory: Negative for cough, chest tightness, shortness of breath and wheezing.   Cardiovascular: Negative for chest pain and leg swelling.  Gastrointestinal: Positive for nausea, abdominal pain and abdominal distention. Negative for vomiting, diarrhea and constipation.  Musculoskeletal: Negative.   Skin: Negative.   Neurological: Negative.       Objective:   Physical Exam  Constitutional: She is oriented to person, place, and time. She appears well-developed and well-nourished.  HENT:  Head: Normocephalic and atraumatic.  Eyes: EOM are normal.  Neck: Normal range of motion.  Cardiovascular: Normal rate.   Pulmonary/Chest: Effort normal and breath sounds normal. No respiratory distress. She has no wheezes. She has no rales.  Abdominal: Soft. Bowel sounds are normal. She exhibits distension. There is no tenderness.  Minimal distention and slight tenderness diffuse, no guarding or rebound. BS normal.   Musculoskeletal: She exhibits no edema or tenderness.  Neurological: She is alert and oriented to person, place, and time. Coordination normal.  Skin: Skin is warm and dry.   Filed Vitals:   10/08/15 1505  BP: 138/80  Pulse: 84  Temp: 97.8 F (36.6 C)    TempSrc: Oral  Resp: 14  Height: 4' 10.5" (1.486 m)  Weight: 147 lb 6.4 oz (66.86 kg)  SpO2: 98%      Assessment & Plan:

## 2015-10-09 NOTE — Assessment & Plan Note (Signed)
Checking HgA1c as it has not been checked in some time. Is on ARB and statin as well as meformin.

## 2015-10-09 NOTE — Assessment & Plan Note (Signed)
Does have gallstones but without overt obstruction on [place] in ER. Checking hepatitis panel and repeating LFTs today. Is on statin which could cause the elevation.

## 2015-10-09 NOTE — Assessment & Plan Note (Signed)
No obstruction noted on [place] in ER, referral to general surgery today. Also investigating the increased LFTs.

## 2015-10-17 ENCOUNTER — Ambulatory Visit: Payer: Self-pay | Admitting: General Surgery

## 2015-10-17 DIAGNOSIS — K8020 Calculus of gallbladder without cholecystitis without obstruction: Secondary | ICD-10-CM | POA: Diagnosis not present

## 2015-11-08 ENCOUNTER — Other Ambulatory Visit: Payer: Self-pay | Admitting: Internal Medicine

## 2015-11-13 DIAGNOSIS — D259 Leiomyoma of uterus, unspecified: Secondary | ICD-10-CM | POA: Diagnosis not present

## 2015-11-13 DIAGNOSIS — N814 Uterovaginal prolapse, unspecified: Secondary | ICD-10-CM | POA: Diagnosis not present

## 2015-11-13 DIAGNOSIS — Z113 Encounter for screening for infections with a predominantly sexual mode of transmission: Secondary | ICD-10-CM | POA: Diagnosis not present

## 2015-11-13 DIAGNOSIS — Z1231 Encounter for screening mammogram for malignant neoplasm of breast: Secondary | ICD-10-CM | POA: Diagnosis not present

## 2015-11-13 DIAGNOSIS — Z13820 Encounter for screening for osteoporosis: Secondary | ICD-10-CM | POA: Diagnosis not present

## 2015-11-13 DIAGNOSIS — Z01411 Encounter for gynecological examination (general) (routine) with abnormal findings: Secondary | ICD-10-CM | POA: Diagnosis not present

## 2015-11-13 DIAGNOSIS — N952 Postmenopausal atrophic vaginitis: Secondary | ICD-10-CM | POA: Diagnosis not present

## 2015-11-13 DIAGNOSIS — Z6828 Body mass index (BMI) 28.0-28.9, adult: Secondary | ICD-10-CM | POA: Diagnosis not present

## 2015-11-22 ENCOUNTER — Encounter (HOSPITAL_BASED_OUTPATIENT_CLINIC_OR_DEPARTMENT_OTHER): Payer: Self-pay | Admitting: *Deleted

## 2015-11-22 NOTE — Progress Notes (Addendum)
NPO AFTER MN.  ARRIVE AT 0930. NEEDS ISTAT 8 AND EKG.    NOTED PT'S SURGERY TIME CHANGE TO 0945.  SPOKE W/ PT TO ARRIVE AT 0800 INSTEAD OF 0930,  PT VERBALLY CONFIRMED ARRIVE AT 0800.

## 2015-11-27 ENCOUNTER — Encounter (HOSPITAL_BASED_OUTPATIENT_CLINIC_OR_DEPARTMENT_OTHER): Payer: Self-pay | Admitting: Anesthesiology

## 2015-11-27 NOTE — Anesthesia Preprocedure Evaluation (Addendum)
Anesthesia Evaluation  Patient identified by MRN, date of birth, ID band Patient awake    Reviewed: Allergy & Precautions, NPO status , Patient's Chart, lab work & pertinent test results  Airway Mallampati: III  TM Distance: >3 FB Neck ROM: Full    Dental  (+) Partial Lower, Partial Upper   Pulmonary neg pulmonary ROS,    Pulmonary exam normal breath sounds clear to auscultation       Cardiovascular hypertension, Pt. on medications Normal cardiovascular exam Rhythm:Regular Rate:Normal  Occasional palpitations   Neuro/Psych negative neurological ROS  negative psych ROS   GI/Hepatic GERD  Medicated and Controlled,Elevated LFT's 2 mos. Ago- Normal now Symptomatic cholelithiasis   Endo/Other  diabetes, Well Controlled, Type 2, Oral Hypoglycemic AgentsHyperlipidemia  Renal/GU negative Renal ROS  negative genitourinary   Musculoskeletal negative musculoskeletal ROS (+)   Abdominal   Peds  Hematology negative hematology ROS (+)   Anesthesia Other Findings iStat showed Hb/Hct 19.4/57.0-  I suspect this is an error but will send a CBC to the lab for confirmation.  Reproductive/Obstetrics                         Anesthesia Physical Anesthesia Plan  ASA: II  Anesthesia Plan: General   Post-op Pain Management:    Induction: Intravenous  Airway Management Planned: Oral ETT  Additional Equipment:   Intra-op Plan:   Post-operative Plan: Extubation in OR  Informed Consent: I have reviewed the patients History and Physical, chart, labs and discussed the procedure including the risks, benefits and alternatives for the proposed anesthesia with the patient or authorized representative who has indicated his/her understanding and acceptance.   Dental advisory given  Plan Discussed with: CRNA, Anesthesiologist and Surgeon  Anesthesia Plan Comments:         Anesthesia Quick Evaluation

## 2015-11-28 ENCOUNTER — Encounter (HOSPITAL_BASED_OUTPATIENT_CLINIC_OR_DEPARTMENT_OTHER): Payer: Self-pay | Admitting: *Deleted

## 2015-11-28 ENCOUNTER — Ambulatory Visit (HOSPITAL_BASED_OUTPATIENT_CLINIC_OR_DEPARTMENT_OTHER)
Admission: RE | Admit: 2015-11-28 | Discharge: 2015-11-28 | Disposition: A | Discharge status: Home / Self Care | Payer: Medicare Other | Source: Ambulatory Visit | Attending: General Surgery | Admitting: General Surgery

## 2015-11-28 ENCOUNTER — Encounter (HOSPITAL_BASED_OUTPATIENT_CLINIC_OR_DEPARTMENT_OTHER)
Admission: RE | Disposition: A | Discharge status: Home / Self Care | Payer: Self-pay | Source: Ambulatory Visit | Attending: General Surgery

## 2015-11-28 ENCOUNTER — Ambulatory Visit (HOSPITAL_BASED_OUTPATIENT_CLINIC_OR_DEPARTMENT_OTHER): Payer: Medicare Other | Admitting: Anesthesiology

## 2015-11-28 DIAGNOSIS — K8010 Calculus of gallbladder with chronic cholecystitis without obstruction: Secondary | ICD-10-CM | POA: Insufficient documentation

## 2015-11-28 DIAGNOSIS — K8020 Calculus of gallbladder without cholecystitis without obstruction: Secondary | ICD-10-CM | POA: Diagnosis not present

## 2015-11-28 DIAGNOSIS — Z79899 Other long term (current) drug therapy: Secondary | ICD-10-CM | POA: Diagnosis not present

## 2015-11-28 DIAGNOSIS — E119 Type 2 diabetes mellitus without complications: Secondary | ICD-10-CM | POA: Diagnosis not present

## 2015-11-28 DIAGNOSIS — E785 Hyperlipidemia, unspecified: Secondary | ICD-10-CM | POA: Insufficient documentation

## 2015-11-28 DIAGNOSIS — Z7984 Long term (current) use of oral hypoglycemic drugs: Secondary | ICD-10-CM | POA: Diagnosis not present

## 2015-11-28 DIAGNOSIS — K811 Chronic cholecystitis: Secondary | ICD-10-CM | POA: Diagnosis present

## 2015-11-28 DIAGNOSIS — I10 Essential (primary) hypertension: Secondary | ICD-10-CM | POA: Diagnosis not present

## 2015-11-28 DIAGNOSIS — K219 Gastro-esophageal reflux disease without esophagitis: Secondary | ICD-10-CM | POA: Insufficient documentation

## 2015-11-28 DIAGNOSIS — K8000 Calculus of gallbladder with acute cholecystitis without obstruction: Secondary | ICD-10-CM | POA: Diagnosis not present

## 2015-11-28 HISTORY — PX: CHOLECYSTECTOMY: SHX55

## 2015-11-28 HISTORY — DX: Calculus of gallbladder without cholecystitis without obstruction: K80.20

## 2015-11-28 HISTORY — DX: Type 2 diabetes mellitus without complications: E11.9

## 2015-11-28 HISTORY — DX: Presence of dental prosthetic device (complete) (partial): Z97.2

## 2015-11-28 HISTORY — DX: Gastro-esophageal reflux disease without esophagitis: K21.9

## 2015-11-28 LAB — CBC
HCT: 36.3 % (ref 36.0–46.0)
HEMOGLOBIN: 11.7 g/dL — AB (ref 12.0–15.0)
MCH: 27.5 pg (ref 26.0–34.0)
MCHC: 32.2 g/dL (ref 30.0–36.0)
MCV: 85.4 fL (ref 78.0–100.0)
Platelets: 255 10*3/uL (ref 150–400)
RBC: 4.25 MIL/uL (ref 3.87–5.11)
RDW: 14.4 % (ref 11.5–15.5)
WBC: 6.9 10*3/uL (ref 4.0–10.5)

## 2015-11-28 LAB — POCT I-STAT, CHEM 8
BUN: 16 mg/dL (ref 6–20)
Calcium, Ion: 1.24 mmol/L (ref 1.13–1.30)
Chloride: 100 mmol/L — ABNORMAL LOW (ref 101–111)
Creatinine, Ser: 0.8 mg/dL (ref 0.44–1.00)
Glucose, Bld: 97 mg/dL (ref 65–99)
HEMATOCRIT: 57 % — AB (ref 36.0–46.0)
HEMOGLOBIN: 19.4 g/dL — AB (ref 12.0–15.0)
Potassium: 3.7 mmol/L (ref 3.5–5.1)
SODIUM: 142 mmol/L (ref 135–145)
TCO2: 28 mmol/L (ref 0–100)

## 2015-11-28 LAB — GLUCOSE, CAPILLARY: GLUCOSE-CAPILLARY: 122 mg/dL — AB (ref 65–99)

## 2015-11-28 SURGERY — LAPAROSCOPIC CHOLECYSTECTOMY
Anesthesia: General

## 2015-11-28 MED ORDER — FENTANYL CITRATE (PF) 100 MCG/2ML IJ SOLN
INTRAMUSCULAR | Status: DC | PRN
  Administered 2015-11-28: 50 ug via INTRAVENOUS
  Administered 2015-11-28 (×2): 25 ug via INTRAVENOUS

## 2015-11-28 MED ORDER — HYDROCODONE-ACETAMINOPHEN 5-325 MG PO TABS
1.0000 | ORAL_TABLET | ORAL | Status: DC | PRN
  Administered 2015-11-28: 1 via ORAL
  Filled 2015-11-28: qty 2

## 2015-11-28 MED ORDER — LACTATED RINGERS IV SOLN
INTRAVENOUS | Status: DC
  Administered 2015-11-28 (×2): via INTRAVENOUS
  Filled 2015-11-28: qty 1000

## 2015-11-28 MED ORDER — GLYCOPYRROLATE 0.2 MG/ML IJ SOLN
INTRAMUSCULAR | Status: AC
  Filled 2015-11-28: qty 3

## 2015-11-28 MED ORDER — LIDOCAINE HCL (CARDIAC) 20 MG/ML IV SOLN
INTRAVENOUS | Status: DC | PRN
  Administered 2015-11-28: 60 mg via INTRAVENOUS

## 2015-11-28 MED ORDER — HYDROCODONE-ACETAMINOPHEN 5-325 MG PO TABS
ORAL_TABLET | ORAL | Status: AC
  Filled 2015-11-28: qty 1

## 2015-11-28 MED ORDER — METOCLOPRAMIDE HCL 5 MG/ML IJ SOLN
10.0000 mg | Freq: Once | INTRAMUSCULAR | Status: DC | PRN
  Filled 2015-11-28: qty 2

## 2015-11-28 MED ORDER — ONDANSETRON HCL 4 MG/2ML IJ SOLN
INTRAMUSCULAR | Status: AC
  Filled 2015-11-28: qty 2

## 2015-11-28 MED ORDER — NEOSTIGMINE METHYLSULFATE 10 MG/10ML IV SOLN
INTRAVENOUS | Status: AC
  Filled 2015-11-28: qty 1

## 2015-11-28 MED ORDER — FENTANYL CITRATE (PF) 100 MCG/2ML IJ SOLN
25.0000 ug | INTRAMUSCULAR | Status: DC | PRN
  Administered 2015-11-28: 25 ug via INTRAVENOUS
  Filled 2015-11-28: qty 1

## 2015-11-28 MED ORDER — ONDANSETRON HCL 4 MG/2ML IJ SOLN
INTRAMUSCULAR | Status: DC | PRN
  Administered 2015-11-28: 4 mg via INTRAVENOUS

## 2015-11-28 MED ORDER — OXYCODONE-ACETAMINOPHEN 5-325 MG PO TABS: 1.0000 | ORAL_TABLET | ORAL | Status: DC | PRN

## 2015-11-28 MED ORDER — PROPOFOL 10 MG/ML IV BOLUS
INTRAVENOUS | Status: AC
  Filled 2015-11-28: qty 20

## 2015-11-28 MED ORDER — LIDOCAINE HCL (CARDIAC) 20 MG/ML IV SOLN
INTRAVENOUS | Status: AC
  Filled 2015-11-28: qty 5

## 2015-11-28 MED ORDER — ROCURONIUM BROMIDE 100 MG/10ML IV SOLN
INTRAVENOUS | Status: AC
  Filled 2015-11-28: qty 1

## 2015-11-28 MED ORDER — DEXTROSE 5 % IV SOLN
2.0000 g | INTRAVENOUS | Status: AC
  Administered 2015-11-28: 2 g via INTRAVENOUS
  Filled 2015-11-28: qty 2

## 2015-11-28 MED ORDER — MEPERIDINE HCL 25 MG/ML IJ SOLN
6.2500 mg | INTRAMUSCULAR | Status: DC | PRN
  Filled 2015-11-28: qty 1

## 2015-11-28 MED ORDER — PROPOFOL 10 MG/ML IV BOLUS
INTRAVENOUS | Status: DC | PRN
Start: 2015-11-28 — End: 2015-11-28
  Administered 2015-11-28: 150 mg via INTRAVENOUS

## 2015-11-28 MED ORDER — FENTANYL CITRATE (PF) 100 MCG/2ML IJ SOLN
INTRAMUSCULAR | Status: AC
  Filled 2015-11-28: qty 2

## 2015-11-28 MED ORDER — CEFOTETAN DISODIUM-DEXTROSE 2-2.08 GM-% IV SOLR
INTRAVENOUS | Status: AC
  Filled 2015-11-28: qty 50

## 2015-11-28 MED ORDER — MIDAZOLAM HCL 2 MG/2ML IJ SOLN
INTRAMUSCULAR | Status: AC
  Filled 2015-11-28: qty 2

## 2015-11-28 MED ORDER — MIDAZOLAM HCL 5 MG/5ML IJ SOLN
INTRAMUSCULAR | Status: DC | PRN
  Administered 2015-11-28: 2 mg via INTRAVENOUS

## 2015-11-28 MED ORDER — LIDOCAINE HCL 1 % IJ SOLN
INTRAMUSCULAR | Status: AC
  Filled 2015-11-28: qty 80

## 2015-11-28 MED ORDER — GLYCOPYRROLATE 0.2 MG/ML IJ SOLN
INTRAMUSCULAR | Status: DC | PRN
  Administered 2015-11-28: 0.4 mg via INTRAVENOUS

## 2015-11-28 MED ORDER — SODIUM CHLORIDE 0.9 % IR SOLN
Status: DC | PRN
  Administered 2015-11-28: 1000 mL

## 2015-11-28 MED ORDER — BUPIVACAINE-EPINEPHRINE 0.25% -1:200000 IJ SOLN
INTRAMUSCULAR | Status: DC | PRN
  Administered 2015-11-28: 30 mL

## 2015-11-28 MED ORDER — NEOSTIGMINE METHYLSULFATE 10 MG/10ML IV SOLN
INTRAVENOUS | Status: DC | PRN
  Administered 2015-11-28: 3 mg via INTRAVENOUS

## 2015-11-28 MED ORDER — ROCURONIUM BROMIDE 100 MG/10ML IV SOLN
INTRAVENOUS | Status: DC | PRN
  Administered 2015-11-28: 20 mg via INTRAVENOUS

## 2015-11-28 MED ORDER — SUCCINYLCHOLINE CHLORIDE 20 MG/ML IJ SOLN
INTRAMUSCULAR | Status: DC | PRN
  Administered 2015-11-28: 100 mg via INTRAVENOUS

## 2015-11-28 SURGICAL SUPPLY — 45 items
APPLIER CLIP ROT 10 11.4 M/L (STAPLE)
BLADE SURG 11 STRL SS (BLADE) ×2 IMPLANT
CATH CHOLANG 76X19 KUMAR (CATHETERS) IMPLANT
CHLORAPREP W/TINT 26ML (MISCELLANEOUS) ×2 IMPLANT
CLIP APPLIE ROT 10 11.4 M/L (STAPLE) IMPLANT
CLIP LIGATING HEM O LOK PURPLE (MISCELLANEOUS) ×2 IMPLANT
CLIP LIGATING HEMO LOK XL GOLD (MISCELLANEOUS) IMPLANT
COVER BACK TABLE 60X90IN (DRAPES) ×2 IMPLANT
COVER MAYO STAND STRL (DRAPES) ×2 IMPLANT
CUTTER FLEX LINEAR 45M (STAPLE) IMPLANT
DECANTER SPIKE VIAL GLASS SM (MISCELLANEOUS) ×2 IMPLANT
DEVICE PMI PUNCTURE CLOSURE (MISCELLANEOUS) ×2 IMPLANT
DRAIN CHANNEL 19F RND (DRAIN) IMPLANT
DRAPE C-ARM 42X72 X-RAY (DRAPES) IMPLANT
DRAPE LAPAROSCOPIC ABDOMINAL (DRAPES) ×2 IMPLANT
ELECT REM PT RETURN 9FT ADLT (ELECTROSURGICAL) ×2
ELECTRODE REM PT RTRN 9FT ADLT (ELECTROSURGICAL) ×1 IMPLANT
EVACUATOR SILICONE 100CC (DRAIN) IMPLANT
GLOVE BIOGEL PI IND STRL 7.0 (GLOVE) ×1 IMPLANT
GLOVE BIOGEL PI INDICATOR 7.0 (GLOVE) ×1
GLOVE SURG SS PI 7.0 STRL IVOR (GLOVE) ×2 IMPLANT
GOWN STRL REUS W/TWL LRG LVL3 (GOWN DISPOSABLE) ×2 IMPLANT
IV NS 1000ML (IV SOLUTION) ×1
IV NS 1000ML BAXH (IV SOLUTION) ×1 IMPLANT
LIQUID BAND (GAUZE/BANDAGES/DRESSINGS) ×2 IMPLANT
NEEDLE HYPO 22GX1.5 SAFETY (NEEDLE) ×2 IMPLANT
PACK BASIN DAY SURGERY FS (CUSTOM PROCEDURE TRAY) ×2 IMPLANT
POUCH RETRIEVAL ECOSAC 10 (ENDOMECHANICALS) ×1 IMPLANT
POUCH RETRIEVAL ECOSAC 10MM (ENDOMECHANICALS) ×1
RELOAD 45 VASCULAR/THIN (ENDOMECHANICALS) IMPLANT
RELOAD STAPLE TA45 3.5 REG BLU (ENDOMECHANICALS) IMPLANT
SCISSORS LAP 5X35 DISP (ENDOMECHANICALS) IMPLANT
SET IRRIG TUBING LAPAROSCOPIC (IRRIGATION / IRRIGATOR) ×2 IMPLANT
SHEARS HARMONIC ACE PLUS 36CM (ENDOMECHANICALS) IMPLANT
STOPCOCK 4 WAY LG BORE MALE ST (IV SETS) IMPLANT
SUT ETHILON 2 0 PS N (SUTURE) IMPLANT
SUT MNCRL AB 4-0 PS2 18 (SUTURE) ×2 IMPLANT
SUT VICRYL 0 ENDOLOOP (SUTURE) IMPLANT
SUT VICRYL 0 UR6 27IN ABS (SUTURE) ×2 IMPLANT
SYR 20CC LL (SYRINGE) IMPLANT
SYR CONTROL 10ML LL (SYRINGE) ×2 IMPLANT
TOWEL OR 17X24 6PK STRL BLUE (TOWEL DISPOSABLE) ×4 IMPLANT
TROCAR BLADELESS OPT 5 100 (ENDOMECHANICALS) ×6 IMPLANT
TROCAR XCEL 12X100 BLDLESS (ENDOMECHANICALS) ×2 IMPLANT
TUBING INSUF HEATED (TUBING) ×2 IMPLANT

## 2015-11-28 NOTE — Anesthesia Postprocedure Evaluation (Signed)
Anesthesia Post Note  Patient: Gloriann Zeleznik  Procedure(s) Performed: Procedure(s) (LRB): LAPAROSCOPIC CHOLECYSTECTOMY (N/A)  Patient location during evaluation: PACU Anesthesia Type: General Level of consciousness: awake and alert Pain management: pain level controlled Vital Signs Assessment: post-procedure vital signs reviewed and stable Respiratory status: spontaneous breathing, nonlabored ventilation, respiratory function stable and patient connected to nasal cannula oxygen Cardiovascular status: blood pressure returned to baseline and stable Postop Assessment: no signs of nausea or vomiting Anesthetic complications: no    Last Vitals:  Filed Vitals:   11/28/15 1300 11/28/15 1315  BP: 148/48 140/65  Pulse: 81 94  Temp:    Resp: 25 32    Last Pain:  Filed Vitals:   11/28/15 1319  PainSc: 6                  Catalina Gravel

## 2015-11-28 NOTE — Transfer of Care (Signed)
Immediate Anesthesia Transfer of Care Note  Patient: Kimberly Welch  Procedure(s) Performed: Procedure(s) (LRB): LAPAROSCOPIC CHOLECYSTECTOMY (N/A)  Patient Location: PACU  Anesthesia Type: General  Level of Consciousness: awake, oriented, sedated and patient cooperative  Airway & Oxygen Therapy: Patient Spontanous Breathing and Patient connected to face mask oxygen  Post-op Assessment: Report given to PACU RN and Post -op Vital signs reviewed and stable  Post vital signs: Reviewed and stable  Complications: No apparent anesthesia complications Last Vitals:  Filed Vitals:   11/28/15 0821 11/28/15 1204  BP: 124/67 146/62  Pulse: 73 83  Temp: 36.6 C 36.8 C  Resp: 16 22    Last Pain:  Filed Vitals:   11/28/15 1206  PainSc: 0-No pain

## 2015-11-28 NOTE — Op Note (Signed)
Preoperative diagnosis: chronic cholecystitis  Postoperative diagnosis: Same   Procedure: laparoscopic cholecystectomy  Surgeon: Gurney Maxin, M.D.  Asst: none  Anesthesia: Gen.   Indications for procedure: Kimberly Welch is a 65 y.o. female with symptoms of Abdominal pain and RUQ pain consistent with gallbladder disease, Confirmed by Ultrasound.  Description of procedure: The patient was brought into the operative suite, placed supine. Anesthesia was administered with endotracheal tube. Patient was strapped in place and foot board was secured. All pressure points were offloaded by foam padding. The patient was prepped and draped in the usual sterile fashion.  A small incision was made to the right of the umbilicus. A 58mm trocar was inserted into the peritoneal cavity with optical entry. Pneumoperitoneum was applied with high flow low pressure. 2 71mm trocars were placed in the RUQ. A 48mm trocar was placed in the subxiphoid space. All trocars sites were first anesthesized with 0.25% marcaine with epinephrine in the subcutaneous and preperitoneal layers. Next the patient was placed in reverse trendelenberg. There were multiple small adhesive bands from the abdominal wall to the liver.  The gallbladder was retracted cephalad and lateral. The peritoneum was reflected off the infundibulum working lateral to medial. The cystic duct and cystic artery were identified and further dissection revealed a critical view. The cystic duct and cystic artery were doubly clipped and ligated.   The gallbladder was removed off the liver bed with cautery. The Gallbladder was placed in a specimen bag. The gallbladder fossa was irrigated and hemostasis was applied with cautery. The gallbladder was removed via the 110mm trocar. There was one large stone requiring dilation of the fascia. The fascia was then closed with a 0 vicryl suture. The adhesive bands to the liver were dissected away with scissors and one adhesion of  the omentum to the falciform was dissected free with cautery. Pneumoperitoneum was removed, all trocar were removed. All incisions were closed with 4-0 monocryl subcuticular stitch. The patient woke from anesthesia and was brought to PACU in stable condition.  Findings: cholelithiasis with some inflammation  Specimen: gallbldadder  Blood loss: <98ml  Local anesthesia: 41ml 0.25% marcaine w epi  Complications: none  Gurney Maxin, M.D. General, Bariatric, & Minimally Invasive Surgery [facility]

## 2015-11-28 NOTE — Discharge Instructions (Signed)
°  Post Anesthesia Home Care Instructions  Activity: Get plenty of rest for the remainder of the day. A responsible adult should stay with you for 24 hours following the procedure.  For the next 24 hours, DO NOT: -Drive a car -Paediatric nurse -Drink alcoholic beverages -Take any medication unless instructed by your physician -Make any legal decisions or sign important papers.  Meals: Start with liquid foods such as gelatin or soup. Progress to regular foods as tolerated. Avoid greasy, spicy, heavy foods. If nausea and/or vomiting occur, drink only clear liquids until the nausea and/or vomiting subsides. Call your physician if vomiting continues. Avoid alcohol while taking prescription pain medication.  Special Instructions/Symptoms: Your throat may feel dry or sore from the anesthesia or the breathing tube placed in your throat during surgery. If this causes discomfort, gargle with warm salt water. The discomfort should disappear within 24 hours.  If you had a scopolamine patch placed behind your ear for the management of post- operative nausea and/or vomiting:  1. The medication in the patch is effective for 72 hours, after which it should be removed.  Wrap patch in a tissue and discard in the trash. Wash hands thoroughly with soap and water. 2. You may remove the patch earlier than 72 hours if you experience unpleasant side effects which may include dry mouth, dizziness or visual disturbances. 3. Avoid touching the patch. Wash your hands with soap and water after contact with the patch.   Call your surgeon if you experience:   1.  Fever over 101.0. 2.  Inability to urinate. 3.  Nausea and/or vomiting. 4.  Extreme swelling or bruising at the surgical site. 5.  Continued bleeding from the incision. 6.  Increased pain, redness or drainage from the incision. 7.  Problems related to your pain medication. 8.  Any problems and/or concerns  HOME CARE INSTRUCTIONS - LAPAROSCOPY  Wound  Care: The bandaids or dressing which are placed over the skin openings may be removed the day after surgery. The incision should be kept clean and dry. The stitches do not need to be removed. Should the incision become sore, red, and swollen after the first week, check with your doctor.  Personal Hygiene: Shower the day after your procedure. Always wipe from front to back after elimination.   Activity: Do not drive or operate any equipment today. The effects of the anesthesia are still present and drowsiness may result. Rest today, not necessarily flat bed rest, just take it easy. You may resume your normal activity in one to three days or as instructed by your physician. You may be out of bed,walking and resume activities a you are able.Do not do any lifting greater thn 10 pounds for two (2) weeks of as instructed by your doctor.  Sexual Activity: You resume sexual activity as indicated by your physician_________.  Diet: Eat a light diet as desired this evening. You may resume a regular diet tomorrow.  Return to Work: Two to three days or as indicated by your doctor.  Expectations After Surgery:  Mild abdominal discomfort or tenderness is not unusual and some shoulder pain may also be noted which can be relieved by lying flat in pain.  Call Your Doctor If these Occur:  Persistent or heavy bleeding at incision site       Redness or swelling around incision       Elevation of temperature greater than 100 degrees F  Call for follow-up appointment _____________.

## 2015-11-28 NOTE — H&P (Signed)
Kimberly Welch is an 65 y.o. female.   Chief Complaint: abdominal pain HPI: 65 yo female with abdominal pain consistent with biliary disease. On work up she was found to have a 2cm stone. She has not had any more issues since her office visit. She denies fevers or chills  Past Medical History  Diagnosis Date  . Hypertension   . Hyperlipidemia   . Type 2 diabetes mellitus ([place])   . GERD (gastroesophageal reflux disease)   . Cholelithiasis     symptomatic  . Wears partial dentures     UPPER AND LOWER    Past Surgical History  Procedure Laterality Date  . D & c hysteroscopy/  resection endometrial polyp's  01-11-2009    History reviewed. No pertinent family history. Social History:  reports that she has never smoked. She has never used smokeless tobacco. She reports that she drinks alcohol. She reports that she does not use illicit drugs.  Allergies:  Allergies  Allergen Reactions  . Sulfa Antibiotics Swelling    Medications Prior to Admission  Medication Sig Dispense Refill  . metFORMIN (GLUCOPHAGE) 500 MG tablet TAKE 1 TABLET (500 MG TOTAL) BY MOUTH DAILY WITH BREAKFAST. 90 tablet 3  . omeprazole (PRILOSEC) 20 MG capsule Take 20 mg by mouth as needed. OTC    . pravastatin (PRAVACHOL) 20 MG tablet Take 1 tablet (20 mg total) by mouth daily. (Patient taking differently: Take 20 mg by mouth every evening. ) 90 tablet 3  . valsartan-hydrochlorothiazide (DIOVAN-HCT) 80-12.5 MG per tablet Take 1 tablet by mouth daily. (Patient taking differently: Take 1 tablet by mouth every morning. ) 90 tablet 3  . ondansetron (ZOFRAN ODT) 4 MG disintegrating tablet Take 1 tablet (4 mg total) by mouth every 8 (eight) hours as needed for nausea or vomiting. 20 tablet 0  . oxyCODONE-acetaminophen (PERCOCET/ROXICET) 5-325 MG tablet Take 1 tablet by mouth every 6 (six) hours as needed for severe pain. 15 tablet 0    Results for orders placed or performed during the hospital encounter of 11/28/15 (from the  past 48 hour(s))  I-STAT, chem 8     Status: Abnormal   Collection Time: 11/28/15  9:29 AM  Result Value Ref Range   Sodium 142 135 - 145 mmol/L   Potassium 3.7 3.5 - 5.1 mmol/L   Chloride 100 (L) 101 - 111 mmol/L   BUN 16 6 - 20 mg/dL   Creatinine, Ser 0.80 0.44 - 1.00 mg/dL   Glucose, Bld 97 65 - 99 mg/dL   Calcium, Ion 1.24 1.13 - 1.30 mmol/L   TCO2 28 0 - 100 mmol/L   Hemoglobin 19.4 (H) 12.0 - 15.0 g/dL   HCT 57.0 (H) 36.0 - 46.0 %  CBC     Status: Abnormal   Collection Time: 11/28/15  9:35 AM  Result Value Ref Range   WBC 6.9 4.0 - 10.5 K/uL   RBC 4.25 3.87 - 5.11 MIL/uL   Hemoglobin 11.7 (L) 12.0 - 15.0 g/dL    Comment: REPEATED TO VERIFY DELTA CHECK NOTED    HCT 36.3 36.0 - 46.0 %   MCV 85.4 78.0 - 100.0 fL   MCH 27.5 26.0 - 34.0 pg   MCHC 32.2 30.0 - 36.0 g/dL   RDW 14.4 11.5 - 15.5 %   Platelets 255 150 - 400 K/uL   No results found.  Review of Systems  Constitutional: Negative for fever and chills.  HENT: Negative for hearing loss.   Eyes: Negative for blurred vision  and double vision.  Respiratory: Negative for cough and hemoptysis.   Cardiovascular: Negative for chest pain and palpitations.  Gastrointestinal: Negative for nausea, vomiting and abdominal pain.  Genitourinary: Negative for dysuria and urgency.  Musculoskeletal: Negative for myalgias and neck pain.  Skin: Negative for itching and rash.  Neurological: Negative for dizziness, tingling and headaches.  Endo/Heme/Allergies: Does not bruise/bleed easily.  Psychiatric/Behavioral: Negative for depression and suicidal ideas.    Blood pressure 124/67, pulse 73, temperature 97.8 F (36.6 C), temperature source Oral, resp. rate 16, height 4' 10.5" (1.486 m), weight 63.277 kg (139 lb 8 oz), last menstrual period 08/05/2003, SpO2 100 %. Physical Exam  Vitals reviewed. Constitutional: She is oriented to person, place, and time. She appears well-developed and well-nourished.  HENT:  Head: Normocephalic  and atraumatic.  Eyes: Conjunctivae and EOM are normal. Pupils are equal, round, and reactive to light.  Neck: Normal range of motion. Neck supple.  Cardiovascular: Normal rate and regular rhythm.   Respiratory: Effort normal and breath sounds normal.  GI: Soft. Bowel sounds are normal. She exhibits no distension. There is no tenderness.  Musculoskeletal: Normal range of motion.  Neurological: She is alert and oriented to person, place, and time.  Skin: Skin is warm and dry.  Psychiatric: She has a normal mood and affect. Her behavior is normal.     Assessment/Plan Symptomatic cholelithiasis -lap chole  Mickeal Skinner, MD 11/28/2015, 10:36 AM

## 2015-11-28 NOTE — Anesthesia Procedure Notes (Signed)
Procedure Name: Intubation Date/Time: 11/28/2015 10:50 AM Performed by: Denna Haggard D Pre-anesthesia Checklist: Patient identified, Emergency Drugs available, Suction available and Patient being monitored Patient Re-evaluated:Patient Re-evaluated prior to inductionOxygen Delivery Method: Circle System Utilized Preoxygenation: Pre-oxygenation with 100% oxygen Intubation Type: IV induction Ventilation: Mask ventilation without difficulty Laryngoscope Size: Mac and 3 Grade View: Grade I Tube type: Oral Tube size: 7.0 mm Number of attempts: 1 Airway Equipment and Method: Stylet and Oral airway Placement Confirmation: ETT inserted through vocal cords under direct vision,  positive ETCO2 and breath sounds checked- equal and bilateral Secured at: 20 cm Tube secured with: Tape Dental Injury: Teeth and Oropharynx as per pre-operative assessment

## 2015-11-29 ENCOUNTER — Encounter (HOSPITAL_BASED_OUTPATIENT_CLINIC_OR_DEPARTMENT_OTHER): Payer: Self-pay | Admitting: General Surgery

## 2015-12-03 ENCOUNTER — Encounter (HOSPITAL_BASED_OUTPATIENT_CLINIC_OR_DEPARTMENT_OTHER): Payer: Self-pay | Admitting: General Surgery

## 2015-12-07 ENCOUNTER — Encounter (HOSPITAL_BASED_OUTPATIENT_CLINIC_OR_DEPARTMENT_OTHER): Payer: Self-pay | Admitting: General Surgery

## 2015-12-29 ENCOUNTER — Other Ambulatory Visit: Payer: Self-pay | Admitting: Internal Medicine

## 2016-03-27 ENCOUNTER — Other Ambulatory Visit: Payer: Self-pay | Admitting: Internal Medicine

## 2016-04-29 DIAGNOSIS — Z13820 Encounter for screening for osteoporosis: Secondary | ICD-10-CM | POA: Diagnosis not present

## 2016-05-01 ENCOUNTER — Other Ambulatory Visit (INDEPENDENT_AMBULATORY_CARE_PROVIDER_SITE_OTHER): Payer: Medicare Other

## 2016-05-01 ENCOUNTER — Encounter: Payer: Self-pay | Admitting: Internal Medicine

## 2016-05-01 ENCOUNTER — Ambulatory Visit (INDEPENDENT_AMBULATORY_CARE_PROVIDER_SITE_OTHER): Payer: Medicare Other | Admitting: Internal Medicine

## 2016-05-01 VITALS — BP 128/78 | HR 94 | Temp 98.4°F | Resp 14 | Ht 59.0 in | Wt 144.0 lb

## 2016-05-01 DIAGNOSIS — E1169 Type 2 diabetes mellitus with other specified complication: Secondary | ICD-10-CM

## 2016-05-01 DIAGNOSIS — K649 Unspecified hemorrhoids: Secondary | ICD-10-CM | POA: Diagnosis not present

## 2016-05-01 DIAGNOSIS — E559 Vitamin D deficiency, unspecified: Secondary | ICD-10-CM

## 2016-05-01 DIAGNOSIS — E669 Obesity, unspecified: Secondary | ICD-10-CM

## 2016-05-01 DIAGNOSIS — E119 Type 2 diabetes mellitus without complications: Secondary | ICD-10-CM | POA: Diagnosis not present

## 2016-05-01 DIAGNOSIS — R42 Dizziness and giddiness: Secondary | ICD-10-CM | POA: Diagnosis not present

## 2016-05-01 LAB — CBC
HCT: 36.1 % (ref 36.0–46.0)
HEMOGLOBIN: 12.2 g/dL (ref 12.0–15.0)
MCHC: 33.9 g/dL (ref 30.0–36.0)
MCV: 83.8 fl (ref 78.0–100.0)
Platelets: 280 10*3/uL (ref 150.0–400.0)
RBC: 4.3 Mil/uL (ref 3.87–5.11)
RDW: 13.3 % (ref 11.5–15.5)
WBC: 7.8 10*3/uL (ref 4.0–10.5)

## 2016-05-01 LAB — LIPID PANEL
CHOL/HDL RATIO: 4
Cholesterol: 177 mg/dL (ref 0–200)
HDL: 49.6 mg/dL (ref >39.00)
LDL Cholesterol: 97 mg/dL (ref 0–99)
NONHDL: 127.45
Triglycerides: 150 mg/dL — ABNORMAL HIGH (ref 0.0–149.0)
VLDL: 30 mg/dL (ref 0.0–40.0)

## 2016-05-01 LAB — COMPREHENSIVE METABOLIC PANEL
ALT: 7 U/L (ref 0–35)
AST: 15 U/L (ref 0–37)
Albumin: 4.5 g/dL (ref 3.5–5.2)
Alkaline Phosphatase: 83 U/L (ref 39–117)
BILIRUBIN TOTAL: 0.5 mg/dL (ref 0.2–1.2)
BUN: 15 mg/dL (ref 6–23)
CO2: 31 meq/L (ref 19–32)
Calcium: 10 mg/dL (ref 8.4–10.5)
Chloride: 99 mEq/L (ref 96–112)
Creatinine, Ser: 0.87 mg/dL (ref 0.40–1.20)
GFR: 83.86 mL/min (ref >60.00)
GLUCOSE: 100 mg/dL — AB (ref 70–99)
POTASSIUM: 4 meq/L (ref 3.5–5.1)
SODIUM: 139 meq/L (ref 135–145)
TOTAL PROTEIN: 7.9 g/dL (ref 6.0–8.3)

## 2016-05-01 LAB — VITAMIN D 25 HYDROXY (VIT D DEFICIENCY, FRACTURES): VITD: 35.81 ng/mL (ref 30.00–100.00)

## 2016-05-01 LAB — HEMOGLOBIN A1C: HEMOGLOBIN A1C: 6.2 % (ref 4.6–6.5)

## 2016-05-01 MED ORDER — HYDROCORTISONE 2.5 % RE CREA: 1.0000 "application " | TOPICAL_CREAM | Freq: Two times a day (BID) | RECTAL | 11 refills | Status: DC

## 2016-05-01 NOTE — Assessment & Plan Note (Signed)
With exercise and resolves with rest. She is not sure what is causing the symptoms. Have advised her to eat and drink water prior to exercise and see if symptoms persist. Checking CMP and CBC today for causes. If still having symptoms have asked her to check blood pressure and sugar with symptoms. No syncope and no episodes outside stress situation. If persistent will refer to cardiology for stress testing (given family history of heart failure).

## 2016-05-01 NOTE — Assessment & Plan Note (Signed)
Rx for anusol cream for the hemorrhoids. Noted on last colonoscopy and she declines exam today. Denies recent constipation but some increase in diarrhea symptoms since gallbladder removal.

## 2016-05-01 NOTE — Patient Instructions (Addendum)
We are checking the labs today and will call you back with the results.   We have sent in hydrocortisone cream that is stronger than the preparation H that you can use twice daily for hemorrhoids as needed.   Try eating more before exercise and if the lightheadedness is still present call us back as it could be blood pressure.

## 2016-05-01 NOTE — Progress Notes (Signed)
Pre visit review using our clinic review tool, if applicable. No additional management support is needed unless otherwise documented below in the visit note. 

## 2016-05-01 NOTE — Assessment & Plan Note (Signed)
Needs HgA1c, checking labs, foot exam done. Taking metformin daily. Last HgA1c was at goal.

## 2016-05-01 NOTE — Progress Notes (Signed)
   Subjective:    Patient ID: Kimberly Welch, female    DOB: January 20, 1951, 66 y.o.   MRN: [id number]  HPI The patient is a 65 YO female coming in for hemorrhoids. She is having itching, has not tried anything for it. She used preparation H in the past with good results but they do not normally last more than 1-2 days and she was concerned. Had hemorrhoids noted on last colonoscopy (done in 2009 and repeat due in 10 years)She also has several concerns which she wants to discuss including her lightheadedness with exercise (she thinks she is not eating enough before exercise and her sugars are running low, she has not checked with symptoms, she has also not checked blood pressure with symptoms. She also recently had bone density scan and they wanted to know her vitamin D level and it has not been checked in some time.   Review of Systems  Constitutional: Negative for activity change, appetite change, fatigue, fever and unexpected weight change.  HENT: Negative.   Respiratory: Negative for cough, chest tightness, shortness of breath and wheezing.   Cardiovascular: Negative for chest pain and leg swelling.  Gastrointestinal: Positive for blood in stool and diarrhea. Negative for abdominal distention, abdominal pain, constipation, nausea and vomiting.  Musculoskeletal: Negative.   Skin: Negative.   Neurological: Positive for light-headedness. Negative for dizziness, weakness, numbness and headaches.      Objective:   Physical Exam  Constitutional: She is oriented to person, place, and time. She appears well-developed and well-nourished.  HENT:  Head: Normocephalic and atraumatic.  Eyes: EOM are normal.  Neck: Normal range of motion.  Cardiovascular: Normal rate and regular rhythm.   Pulmonary/Chest: Effort normal and breath sounds normal. No respiratory distress. She has no wheezes. She has no rales.  Abdominal: Soft. Bowel sounds are normal. She exhibits no distension. There is no tenderness. There  is no rebound.  Declines anal exam  Musculoskeletal: She exhibits no edema or tenderness.  Neurological: She is alert and oriented to person, place, and time. Coordination normal.  Skin: Skin is warm and dry.   Vitals:   05/01/16 1018  BP: 128/78  Pulse: 94  Resp: 14  Temp: 98.4 F (36.9 C)  TempSrc: Oral  SpO2: 98%  Weight: 144 lb (65.3 kg)  Height: 4\' 11"  (1.499 m)      Assessment & Plan:

## 2016-07-11 DIAGNOSIS — H25013 Cortical age-related cataract, bilateral: Secondary | ICD-10-CM | POA: Diagnosis not present

## 2016-07-11 DIAGNOSIS — H40013 Open angle with borderline findings, low risk, bilateral: Secondary | ICD-10-CM | POA: Diagnosis not present

## 2016-07-11 DIAGNOSIS — H04123 Dry eye syndrome of bilateral lacrimal glands: Secondary | ICD-10-CM | POA: Diagnosis not present

## 2016-11-11 DIAGNOSIS — H40013 Open angle with borderline findings, low risk, bilateral: Secondary | ICD-10-CM | POA: Diagnosis not present

## 2016-11-17 ENCOUNTER — Other Ambulatory Visit: Payer: Self-pay | Admitting: Internal Medicine

## 2016-12-13 ENCOUNTER — Other Ambulatory Visit: Payer: Self-pay | Admitting: Internal Medicine

## 2016-12-26 ENCOUNTER — Other Ambulatory Visit: Payer: Self-pay | Admitting: Internal Medicine

## 2017-01-06 ENCOUNTER — Encounter: Payer: Self-pay | Admitting: Internal Medicine

## 2017-01-06 ENCOUNTER — Ambulatory Visit (INDEPENDENT_AMBULATORY_CARE_PROVIDER_SITE_OTHER): Payer: Medicare Other | Admitting: Internal Medicine

## 2017-01-06 ENCOUNTER — Ambulatory Visit (INDEPENDENT_AMBULATORY_CARE_PROVIDER_SITE_OTHER)
Admission: RE | Admit: 2017-01-06 | Discharge: 2017-01-06 | Disposition: A | Discharge status: Home / Self Care | Payer: Medicare Other | Source: Ambulatory Visit | Attending: Internal Medicine | Admitting: Internal Medicine

## 2017-01-06 ENCOUNTER — Other Ambulatory Visit (INDEPENDENT_AMBULATORY_CARE_PROVIDER_SITE_OTHER): Payer: Medicare Other

## 2017-01-06 VITALS — BP 134/70 | HR 118 | Temp 99.4°F | Resp 12 | Ht 59.0 in | Wt 156.0 lb

## 2017-01-06 DIAGNOSIS — I10 Essential (primary) hypertension: Secondary | ICD-10-CM

## 2017-01-06 DIAGNOSIS — E1169 Type 2 diabetes mellitus with other specified complication: Secondary | ICD-10-CM

## 2017-01-06 DIAGNOSIS — Z0000 Encounter for general adult medical examination without abnormal findings: Secondary | ICD-10-CM

## 2017-01-06 DIAGNOSIS — E669 Obesity, unspecified: Secondary | ICD-10-CM

## 2017-01-06 DIAGNOSIS — M25551 Pain in right hip: Secondary | ICD-10-CM

## 2017-01-06 LAB — COMPREHENSIVE METABOLIC PANEL
ALBUMIN: 4.7 g/dL (ref 3.5–5.2)
ALK PHOS: 79 U/L (ref 39–117)
ALT: 8 U/L (ref 0–35)
AST: 14 U/L (ref 0–37)
BUN: 11 mg/dL (ref 6–23)
CO2: 31 mEq/L (ref 19–32)
Calcium: 10.1 mg/dL (ref 8.4–10.5)
Chloride: 97 mEq/L (ref 96–112)
Creatinine, Ser: 0.94 mg/dL (ref 0.40–1.20)
GFR: 76.53 mL/min (ref >60.00)
GLUCOSE: 121 mg/dL — AB (ref 70–99)
POTASSIUM: 3.8 meq/L (ref 3.5–5.1)
Sodium: 138 mEq/L (ref 135–145)
TOTAL PROTEIN: 8 g/dL (ref 6.0–8.3)
Total Bilirubin: 0.5 mg/dL (ref 0.2–1.2)

## 2017-01-06 LAB — HEMOGLOBIN A1C: HEMOGLOBIN A1C: 6.5 % (ref 4.6–6.5)

## 2017-01-06 NOTE — Progress Notes (Signed)
   Subjective:    Patient ID: Kimberly Welch, female    DOB: Sep 25, 1950, 66 y.o.   MRN: [id number]  HPI The patient is a 66 YO female coming in for follow up of her diabetes (taking metformin, on ARB and statin, last LDL at goal, not complicated), and her blood pressure (taking valsartan/hctz with good control, not complicated, denies headaches, chest pains SOB or nausea or vomiting) as well as new problem of right hip pain (started 1-2 weeks ago, she almost fell while at the beach walking on the sand, pain in the buttock and radiating down into the right thigh, no numbness, some weakness and she felt she might fall but did not, some pain in the groin and the thigh, has not tried anything for pain, one other episode of weakness since being home which worries her).   Review of Systems  Constitutional: Positive for activity change and fatigue. Negative for appetite change, chills, fever and unexpected weight change.  HENT: Negative.   Eyes: Negative.   Respiratory: Negative for cough, chest tightness and shortness of breath.   Cardiovascular: Negative for chest pain, palpitations and leg swelling.  Gastrointestinal: Negative for abdominal distention, abdominal pain, constipation, diarrhea, nausea and vomiting.  Musculoskeletal: Positive for arthralgias and gait problem. Negative for back pain, joint swelling, myalgias and neck pain.  Skin: Negative.   Neurological: Negative for dizziness, tremors, syncope, facial asymmetry, weakness and headaches.  Psychiatric/Behavioral: Negative.       Objective:   Physical Exam  Constitutional: She is oriented to person, place, and time. She appears well-developed and well-nourished.  HENT:  Head: Normocephalic and atraumatic.  Eyes: EOM are normal.  Neck: Normal range of motion.  Cardiovascular: Normal rate and regular rhythm.   Pulmonary/Chest: Effort normal and breath sounds normal. No respiratory distress. She has no wheezes. She has no rales.    Abdominal: Soft. Bowel sounds are normal. She exhibits no distension. There is no tenderness. There is no rebound.  Musculoskeletal: She exhibits tenderness. She exhibits no edema.  Lower back pain which radiates into the left thigh  Neurological: She is alert and oriented to person, place, and time. Coordination normal.  Skin: Skin is warm and dry.  Psychiatric: She has a normal mood and affect.   Vitals:   01/06/17 1431  BP: 134/70  Pulse: (!) 118  Resp: 12  Temp: 99.4 F (37.4 C)  TempSrc: Oral  SpO2: 96%  Weight: 156 lb (70.8 kg)  Height: 4\' 11"  (1.499 m)      Assessment & Plan:

## 2017-01-06 NOTE — Assessment & Plan Note (Signed)
Foot exam up to date, reminded about eye exam and pneumonia which she will think about. She is taking metformin 500 mg daily and this is managing. She is on ARB and statin with last LDL at goal. Not complicated.

## 2017-01-06 NOTE — Assessment & Plan Note (Signed)
BP at goal on valsartan/hctz. Checking CMP and adjust as needed.

## 2017-01-06 NOTE — Assessment & Plan Note (Signed)
Checking x-ray today. Rx for meloxicam for pain and inflammation. Suspect some sciatica.

## 2017-01-06 NOTE — Patient Instructions (Addendum)
We have sent in meloxicam which is the anti-inflammatory medicine. Take 1 pill daily for 1-2 weeks. We will call you back about the x-ray results.   Continue to eat heart healthy diet (full of fruits, vegetables, whole grains, lean protein, water--limit salt, fat, and sugar intake) and increase physical activity as tolerated.  Start doing brain stimulating activities (puzzles, reading, adult coloring books, staying active) to keep memory sharp.   Please ask Dr. Leo Grosser to send medical records to Dr. Sharlet Salina  Ms. Dix , Thank you for taking time to come for your Medicare Wellness Visit. I appreciate your ongoing commitment to your health goals. Please review the following plan we discussed and let me know if I can assist you in the future.   These are the goals we discussed: Goals    . lose weight          Continue to exercise, eat a healthier diet, decrease the amount of soda.       This is a list of the screening recommended for you and due dates:  Health Maintenance  Topic Date Due  . Hemoglobin A1C  10/29/2016  . Mammogram  01/06/2018*  . Eye exam for diabetics  01/06/2018*  . DEXA scan (bone density measurement)  01/06/2018*  . Tetanus Vaccine  01/06/2018*  . Pneumonia vaccines (1 of 2 - PCV13) 01/06/2018*  . Flu Shot  03/04/2017  . Complete foot exam   05/01/2017  . Colon Cancer Screening  02/27/2018  .  Hepatitis C: One time screening is recommended by Center for Disease Control  (CDC) for  adults born from [birth year] through 1965.   Completed  *Topic was postponed. The date shown is not the original due date.   Insomnia Insomnia is a sleep disorder that makes it difficult to fall asleep or to stay asleep. Insomnia can cause tiredness (fatigue), low energy, difficulty concentrating, mood swings, and poor performance at work or school. There are three different ways to classify insomnia:  Difficulty falling asleep.  Difficulty staying asleep.  Waking up too early in the  morning.  Any type of insomnia can be long-term (chronic) or short-term (acute). Both are common. Short-term insomnia usually lasts for three months or less. Chronic insomnia occurs at least three times a week for longer than three months. What are the causes? Insomnia may be caused by another condition, situation, or substance, such as:  Anxiety.  Certain medicines.  Gastroesophageal reflux disease (GERD) or other gastrointestinal conditions.  Asthma or other breathing conditions.  Restless legs syndrome, sleep apnea, or other sleep disorders.  Chronic pain.  Menopause. This may include hot flashes.  Stroke.  Abuse of alcohol, tobacco, or illegal drugs.  Depression.  Caffeine.  Neurological disorders, such as Alzheimer disease.  An overactive thyroid (hyperthyroidism).  The cause of insomnia may not be known. What increases the risk? Risk factors for insomnia include:  Gender. Women are more commonly affected than men.  Age. Insomnia is more common as you get older.  Stress. This may involve your professional or personal life.  Income. Insomnia is more common in people with lower income.  Lack of exercise.  Irregular work schedule or night shifts.  Traveling between different time zones.  What are the signs or symptoms? If you have insomnia, trouble falling asleep or trouble staying asleep is the main symptom. This may lead to other symptoms, such as:  Feeling fatigued.  Feeling nervous about going to sleep.  Not feeling rested in the morning.  Having trouble concentrating.  Feeling irritable, anxious, or depressed.  How is this treated? Treatment for insomnia depends on the cause. If your insomnia is caused by an underlying condition, treatment will focus on addressing the condition. Treatment may also include:  Medicines to help you sleep.  Counseling or therapy.  Lifestyle adjustments.  Follow these instructions at home:  Take medicines  only as directed by your health care provider.  Keep regular sleeping and waking hours. Avoid naps.  Keep a sleep diary to help you and your health care provider figure out what could be causing your insomnia. Include: ? When you sleep. ? When you wake up during the night. ? How well you sleep. ? How rested you feel the next day. ? Any side effects of medicines you are taking. ? What you eat and drink.  Make your bedroom a comfortable place where it is easy to fall asleep: ? Put up shades or special blackout curtains to block light from outside. ? Use a white noise machine to block noise. ? Keep the temperature cool.  Exercise regularly as directed by your health care provider. Avoid exercising right before bedtime.  Use relaxation techniques to manage stress. Ask your health care provider to suggest some techniques that may work well for you. These may include: ? Breathing exercises. ? Routines to release muscle tension. ? Visualizing peaceful scenes.  Cut back on alcohol, caffeinated beverages, and cigarettes, especially close to bedtime. These can disrupt your sleep.  Do not overeat or eat spicy foods right before bedtime. This can lead to digestive discomfort that can make it hard for you to sleep.  Limit screen use before bedtime. This includes: ? Watching TV. ? Using your smartphone, tablet, and computer.  Stick to a routine. This can help you fall asleep faster. Try to do a quiet activity, brush your teeth, and go to bed at the same time each night.  Get out of bed if you are still awake after 15 minutes of trying to sleep. Keep the lights down, but try reading or doing a quiet activity. When you feel sleepy, go back to bed.  Make sure that you drive carefully. Avoid driving if you feel very sleepy.  Keep all follow-up appointments as directed by your health care provider. This is important. Contact a health care provider if:  You are tired throughout the day or have  trouble in your daily routine due to sleepiness.  You continue to have sleep problems or your sleep problems get worse. Get help right away if:  You have serious thoughts about hurting yourself or someone else. This information is not intended to replace advice given to you by your health care provider. Make sure you discuss any questions you have with your health care provider. Document Released: 07/18/2000 Document Revised: 12/21/2015 Document Reviewed: 04/21/2014 Elsevier Interactive Patient Education  [employer].

## 2017-01-06 NOTE — Progress Notes (Signed)
Pre visit review using our clinic review tool, if applicable. No additional management support is needed unless otherwise documented below in the visit note. 

## 2017-01-06 NOTE — Progress Notes (Signed)
Subjective:   Kimberly Welch is a 66 y.o. female who presents for an Initial Medicare Annual Wellness Visit.  Review of Systems    No ROS.  Medicare Wellness Visit.  Cardiac Risk Factors include: diabetes mellitus;advanced age (>41men, >58 women);hypertension;dyslipidemia Sleep patterns: has frequent nighttime awakenings, has daytime sleepiness, gets up 1-2 times nightly to void and sleeps 5-6 hours nightly.  Patient reports insomnia issues, discussed recommended sleep tips and stress reduction tips, education was attached to patient's AVS.  Home Safety/Smoke Alarms: Feels safe in home. Smoke alarms in place.    Living environment; residence and Firearm Safety: 2-story house, no firearms. Lives with family, no needs for DME Seat Belt Safety/Bike Helmet: Wears seat belt.   Counseling:   Eye Exam- appointment every 6 months   Dental- appointment yearly  Female:   Pap- Patient states routinely through GYN Dr. Leo Grosser, she will ask for the results to be sent to PCP       Mammo- Last 11/02/13, Patient states yearly through GYN Dr. Leo Grosser, she will ask for the results to be sent to PCP   Dexa scan- N/D Patient states routinely through GYN Dr. Leo Grosser, she will ask for the results to be sent to PCP  CCS- Last 02/28/08, recall 10 years     Objective:    Today's Vitals   01/06/17 1431 01/06/17 1501  BP: 134/70   Pulse: (!) 118   Resp: 12   Temp: 99.4 F (37.4 C)   TempSrc: Oral   SpO2: 96%   Weight: 156 lb (70.8 kg)   Height: 4\' 11"  (1.499 m)   PainSc:  3    Body mass index is 31.51 kg/m.   Current Medications (verified) Outpatient Encounter Prescriptions as of 01/06/2017  Medication Sig  . hydrocortisone (ANUSOL-HC) 2.5 % rectal cream Place 1 application rectally 2 (two) times daily.  . metFORMIN (GLUCOPHAGE) 500 MG tablet TAKE 1 TABLET (500 MG TOTAL) BY MOUTH DAILY WITH BREAKFAST.  Marland Kitchen omeprazole (PRILOSEC) 20 MG capsule Take 20 mg by mouth as needed. OTC  . pravastatin  (PRAVACHOL) 20 MG tablet TAKE 1 TABLET (20 MG TOTAL) BY MOUTH DAILY.  . valsartan-hydrochlorothiazide (DIOVAN-HCT) 80-12.5 MG tablet TAKE 1 TABLET BY MOUTH DAILY.   No facility-administered encounter medications on file as of 01/06/2017.     Allergies (verified) Sulfa antibiotics   History: Past Medical History:  Diagnosis Date  . Cholelithiasis    symptomatic  . GERD (gastroesophageal reflux disease)   . Hyperlipidemia   . Hypertension   . Type 2 diabetes mellitus ([place])   . Wears partial dentures    UPPER AND LOWER   Past Surgical History:  Procedure Laterality Date  . CHOLECYSTECTOMY N/A 11/28/2015   Procedure: LAPAROSCOPIC CHOLECYSTECTOMY;  Surgeon: Mickeal Skinner, MD;  Location: [facility];  Service: General;  Laterality: N/A;  . D & C HYSTEROSCOPY/  RESECTION ENDOMETRIAL POLYP'S  01-11-2009   History reviewed. No pertinent family history. Social History   Occupational History  . Not on file.   Social History Main Topics  . Smoking status: Never Smoker  . Smokeless tobacco: Never Used  . Alcohol use 0.0 oz/week     Comment: RARE  . Drug use: No  . Sexual activity: Not on file    Tobacco Counseling Counseling given: Not Answered   Activities of Daily Living In your present state of health, do you have any difficulty performing the following activities: 01/06/2017  Hearing? N  Vision? N  Difficulty concentrating or making decisions? N  Walking or climbing stairs? N  Dressing or bathing? N  Doing errands, shopping? N  Preparing Food and eating ? N  Using the Toilet? N  In the past six months, have you accidently leaked urine? N  Do you have problems with loss of bowel control? N  Managing your Medications? N  Managing your Finances? N  Housekeeping or managing your Housekeeping? N  Some recent data might be hidden    Immunizations and Health Maintenance  There is no immunization history on file for this patient. Health Maintenance  Due  Topic Date Due  . HEMOGLOBIN A1C  10/29/2016    Patient Care Team: Hoyt Koch, MD as PCP - General (Internal Medicine) Leo Grosser Seymour Bars, MD as Consulting Physician (Obstetrics and Gynecology) Gatha Mayer, MD as Consulting Physician (Gastroenterology)  Indicate any recent Medical Services you may have received from other than [facility] providers in the past year (date may be approximate).     Assessment:   This is a routine wellness examination for Kimberly Welch.Physical assessment deferred to PCP.   Hearing/Vision screen Hearing Screening Comments: Able to hear conversational tones w/o difficulty. No issues reported.   Vision Screening Comments: Wears glasses  Dietary issues and exercise activities discussed: Current Exercise Habits: Structured exercise class, Type of exercise: yoga;Other - see comments (line dancing), Time (Minutes): 60, Frequency (Times/Week): 3, Weekly Exercise (Minutes/Week): 180, Intensity: Moderate, Exercise limited by: None identified  Diet (meal preparation, eat out, water intake, caffeinated beverages, dairy products, fruits and vegetables): in general, a "healthy" diet  , well balanced, patient reports that lately she has not closely followed a heart healthy,diabetic diet. Drinks 1-2 glassed of water daily, drinks soda and other sugary drinks daily.   Reviewed heart healthy and diabetic diet, encouraged patient to increase daily water intake and decrease daily intake of sugary drinks, Diet education was provided via handout.  Goals    . lose weight          Continue to exercise, eat a healthier diet, decrease the amount of soda.      Depression Screen PHQ 2/9 Scores 01/06/2017 10/08/2015  PHQ - 2 Score 0 0    Fall Risk Fall Risk  01/06/2017 10/08/2015  Falls in the past year? No No  Risk for fall due to : Impaired mobility -    Cognitive Function:       Ad8 score reviewed for issues:  Issues making decisions: no  Less interest in  hobbies / activities: no  Repeats questions, stories (family complaining): no  Trouble using ordinary gadgets (microwave, computer, phone): no  Forgets the month or year: no  Mismanaging finances: no  Remembering appts: no  Daily problems with thinking and/or memory: no Ad8 score is= 0  Screening Tests Health Maintenance  Topic Date Due  . HEMOGLOBIN A1C  10/29/2016  . MAMMOGRAM  01/06/2018 (Originally 11/03/2015)  . OPHTHALMOLOGY EXAM  01/06/2018 (Originally 08/18/1960)  . DEXA SCAN  01/06/2018 (Originally 08/19/2015)  . TETANUS/TDAP  01/06/2018 (Originally 08/18/1969)  . PNA vac Low Risk Adult (1 of 2 - PCV13) 01/06/2018 (Originally 08/19/2015)  . INFLUENZA VACCINE  03/04/2017  . FOOT EXAM  05/01/2017  . COLONOSCOPY  02/27/2018  . Hepatitis C Screening  Completed      Plan:    Continue to eat heart healthy diet (full of fruits, vegetables, whole grains, lean protein, water--limit salt, fat, and sugar intake) and increase physical activity as tolerated.  Start  doing brain stimulating activities (puzzles, reading, adult coloring books, staying active) to keep memory sharp.   I have personally reviewed and noted the following in the patient's chart:   . Medical and social history . Use of alcohol, tobacco or illicit drugs  . Current medications and supplements . Functional ability and status . Nutritional status . Physical activity . Advanced directives . List of other physicians . Vitals . Screenings to include cognitive, depression, and falls . Referrals and appointments  In addition, I have reviewed and discussed with patient certain preventive protocols, quality metrics, and best practice recommendations. A written personalized care plan for preventive services as well as general preventive health recommendations were provided to patient.     Michiel Cowboy, RN   01/06/2017

## 2017-01-07 NOTE — Progress Notes (Signed)
Patient ID: Kimberly Welch, female   DOB: 09-May-1951, 66 y.o.   MRN: [id number] Medical screening examination/treatment/procedure(s) were performed by non-physician practitioner and as supervising physician I was immediately available for consultation/collaboration. I agree with above. Hoyt Koch, MD

## 2017-01-10 ENCOUNTER — Ambulatory Visit (HOSPITAL_COMMUNITY)
Admission: EM | Admit: 2017-01-10 | Discharge: 2017-01-10 | Disposition: A | Discharge status: Home / Self Care | Payer: Medicare Other | Attending: Internal Medicine | Admitting: Internal Medicine

## 2017-01-10 ENCOUNTER — Encounter (HOSPITAL_COMMUNITY): Payer: Self-pay | Admitting: *Deleted

## 2017-01-10 DIAGNOSIS — H6693 Otitis media, unspecified, bilateral: Secondary | ICD-10-CM

## 2017-01-10 DIAGNOSIS — J069 Acute upper respiratory infection, unspecified: Secondary | ICD-10-CM | POA: Diagnosis not present

## 2017-01-10 DIAGNOSIS — H5789 Other specified disorders of eye and adnexa: Secondary | ICD-10-CM

## 2017-01-10 MED ORDER — NAPHAZOLINE-PHENIRAMINE 0.025-0.3 % OP SOLN: 2.0000 [drp] | Freq: Four times a day (QID) | OPHTHALMIC | 0 refills | Status: DC | PRN

## 2017-01-10 MED ORDER — AMOXICILLIN 500 MG PO CAPS
500.0000 mg | ORAL_CAPSULE | Freq: Three times a day (TID) | ORAL | 0 refills | Status: DC
Start: 2017-01-10 — End: 2017-05-04

## 2017-01-10 NOTE — ED Triage Notes (Signed)
Pt  Reports      Symptoms  Of  Drainage    Cough     And  Congested         With   Earache     l  Ear      As   Well  As   Some  Redness  Of  The 5r   Eye    She  Was   Seen  3  Days  Ago      For  [place]

## 2017-01-10 NOTE — ED Provider Notes (Signed)
CSN: [id number]     Arrival date & time 01/10/17  1647 History   First MD Initiated Contact with Patient 01/10/17 1738     Chief Complaint  Patient presents with  . URI   (Consider location/radiation/quality/duration/timing/severity/associated sxs/prior Treatment) HPI Kimberly Welch is a 66 y.o. female presenting to UC with c/o 5 days of worsening URI symptoms.  She was seen by her PCP for sinus congestion and cough 3 days ago, advised her symptoms were due to a virus.  She was more congested yesterday, started taking Mucinex as recommended by the pharmacist.  Pain in ears and sinuses worse today.  Aching and sore.  Mildly productive cough. Denies fever, chills, n/v/d.  Pt also c/o Right eye irritation with mild crusting drainage. Denies pain or change in vision.   Past Medical History:  Diagnosis Date  . Cholelithiasis    symptomatic  . GERD (gastroesophageal reflux disease)   . Hyperlipidemia   . Hypertension   . Type 2 diabetes mellitus ([place])   . Wears partial dentures    UPPER AND LOWER   Past Surgical History:  Procedure Laterality Date  . CHOLECYSTECTOMY N/A 11/28/2015   Procedure: LAPAROSCOPIC CHOLECYSTECTOMY;  Surgeon: Mickeal Skinner, MD;  Location[facility];  Service: General;  Laterality: N/A;  . D & C HYSTEROSCOPY/  RESECTION ENDOMETRIAL POLYP'S  01-11-2009   History reviewed. No pertinent family history. Social History  Substance Use Topics  . Smoking status: Never Smoker  . Smokeless tobacco: Never Used  . Alcohol use 0.0 oz/week     Comment: RARE   OB History    No data available     Review of Systems  Constitutional: Negative for chills and fever.  HENT: Positive for congestion, ear pain, postnasal drip, sinus pain, sinus pressure and sore throat. Negative for trouble swallowing and voice change.   Respiratory: Positive for cough. Negative for shortness of breath.   Cardiovascular: Negative for chest pain and palpitations.   Gastrointestinal: Negative for abdominal pain, diarrhea, nausea and vomiting.  Musculoskeletal: Negative for arthralgias, back pain and myalgias.  Skin: Negative for rash.  Neurological: Positive for headaches. Negative for dizziness and light-headedness.    Allergies  Sulfa antibiotics  Home Medications   Prior to Admission medications   Medication Sig Start Date End Date Taking? Authorizing Provider  amoxicillin (AMOXIL) 500 MG capsule Take 1 capsule (500 mg total) by mouth 3 (three) times daily. 01/10/17   Noland Fordyce, PA-C  hydrocortisone (ANUSOL-HC) 2.5 % rectal cream Place 1 application rectally 2 (two) times daily. 05/01/16   Hoyt Koch, MD  metFORMIN (GLUCOPHAGE) 500 MG tablet TAKE 1 TABLET (500 MG TOTAL) BY MOUTH DAILY WITH BREAKFAST. 11/17/16   Hoyt Koch, MD  naphazoline-pheniramine (NAPHCON-A) 0.025-0.3 % ophthalmic solution Place 2 drops into both eyes 4 (four) times daily as needed for irritation. Until symptoms resolve 01/10/17   Noland Fordyce, PA-C  omeprazole (PRILOSEC) 20 MG capsule Take 20 mg by mouth as needed. OTC    [provider]  pravastatin (PRAVACHOL) 20 MG tablet TAKE 1 TABLET (20 MG TOTAL) BY MOUTH DAILY. 12/26/16   Hoyt Koch, MD  valsartan-hydrochlorothiazide (DIOVAN-HCT) 80-12.5 MG tablet TAKE 1 TABLET BY MOUTH DAILY. 12/15/16   Hoyt Koch, MD   Meds Ordered and Administered this Visit  Medications - No data to display  BP 135/78 (BP Location: Right Arm)   Pulse (!) 106 Comment: reported elevated HR to nurse Balinda Quails  Temp 99.1  F (37.3 C) (Oral)   Resp 16   LMP 08/05/2003   SpO2 99%  No data found.   Physical Exam  Constitutional: She is oriented to person, place, and time. She appears well-developed and well-nourished. No distress.  HENT:  Head: Normocephalic and atraumatic.  Right Ear: Tympanic membrane is erythematous and bulging.  Left Ear: Tympanic membrane is erythematous and bulging.   Nose: Mucosal edema present. Right sinus exhibits no maxillary sinus tenderness and no frontal sinus tenderness. Left sinus exhibits no maxillary sinus tenderness and no frontal sinus tenderness.  Mouth/Throat: Uvula is midline, oropharynx is clear and moist and mucous membranes are normal.  Eyes: Conjunctivae and EOM are normal. Pupils are equal, round, and reactive to light. Right eye exhibits discharge (scant, crusting). Left eye exhibits no discharge.  Neck: Normal range of motion. Neck supple.  Cardiovascular: Normal rate and regular rhythm.   Pulmonary/Chest: Effort normal and breath sounds normal. No stridor. No respiratory distress. She has no wheezes. She has no rales.  Musculoskeletal: Normal range of motion.  Lymphadenopathy:    She has no cervical adenopathy.  Neurological: She is alert and oriented to person, place, and time.  Skin: Skin is warm and dry. She is not diaphoretic.  Psychiatric: She has a normal mood and affect. Her behavior is normal.  Nursing note and vitals reviewed.   Urgent Care Course     Procedures (including critical care time)  Labs Review Labs Reviewed - No data to display  Imaging Review No results found.    MDM   1. Upper respiratory tract infection, unspecified type   2. Bilateral acute otitis media   3. Irritation of right eye    Exam c/w bilateral AOM Eye exam most c/w viral conjunctivitis rather than bacterial.  Rx: Amoxicillin and Visine   Home care instructions provided.  f/u with PCP in 7-10 days if not improving, sooner if worsening.     Noland Fordyce, PA-C 01/10/17 1905

## 2017-04-02 ENCOUNTER — Other Ambulatory Visit: Payer: Self-pay | Admitting: Internal Medicine

## 2017-05-04 ENCOUNTER — Ambulatory Visit (INDEPENDENT_AMBULATORY_CARE_PROVIDER_SITE_OTHER): Payer: Medicare Other | Admitting: Internal Medicine

## 2017-05-04 ENCOUNTER — Other Ambulatory Visit (INDEPENDENT_AMBULATORY_CARE_PROVIDER_SITE_OTHER): Payer: Medicare Other

## 2017-05-04 ENCOUNTER — Encounter: Payer: Self-pay | Admitting: Internal Medicine

## 2017-05-04 VITALS — BP 132/84 | HR 84 | Temp 97.7°F | Ht 59.0 in | Wt 152.0 lb

## 2017-05-04 DIAGNOSIS — E785 Hyperlipidemia, unspecified: Secondary | ICD-10-CM | POA: Diagnosis not present

## 2017-05-04 DIAGNOSIS — E1169 Type 2 diabetes mellitus with other specified complication: Secondary | ICD-10-CM | POA: Diagnosis not present

## 2017-05-04 DIAGNOSIS — E669 Obesity, unspecified: Secondary | ICD-10-CM | POA: Diagnosis not present

## 2017-05-04 DIAGNOSIS — I10 Essential (primary) hypertension: Secondary | ICD-10-CM | POA: Diagnosis not present

## 2017-05-04 LAB — COMPREHENSIVE METABOLIC PANEL
ALT: 9 U/L (ref 0–35)
AST: 17 U/L (ref 0–37)
Albumin: 4.5 g/dL (ref 3.5–5.2)
Alkaline Phosphatase: 71 U/L (ref 39–117)
BUN: 15 mg/dL (ref 6–23)
CO2: 30 mEq/L (ref 19–32)
Calcium: 10.2 mg/dL (ref 8.4–10.5)
Chloride: 98 mEq/L (ref 96–112)
Creatinine, Ser: 0.97 mg/dL (ref 0.40–1.20)
GFR: 73.73 mL/min (ref >60.00)
GLUCOSE: 105 mg/dL — AB (ref 70–99)
POTASSIUM: 4 meq/L (ref 3.5–5.1)
SODIUM: 137 meq/L (ref 135–145)
TOTAL PROTEIN: 7.3 g/dL (ref 6.0–8.3)
Total Bilirubin: 0.4 mg/dL (ref 0.2–1.2)

## 2017-05-04 LAB — LIPID PANEL
Cholesterol: 169 mg/dL (ref 0–200)
HDL: 43.4 mg/dL (ref >39.00)
LDL Cholesterol: 98 mg/dL (ref 0–99)
NONHDL: 125.54
Total CHOL/HDL Ratio: 4
Triglycerides: 138 mg/dL (ref 0.0–149.0)
VLDL: 27.6 mg/dL (ref 0.0–40.0)

## 2017-05-04 LAB — HEMOGLOBIN A1C: HEMOGLOBIN A1C: 6.3 % (ref 4.6–6.5)

## 2017-05-04 MED ORDER — LOSARTAN POTASSIUM-HCTZ 50-12.5 MG PO TABS: 1.0000 | ORAL_TABLET | Freq: Every day | ORAL | 3 refills | Status: DC

## 2017-05-04 MED ORDER — ALPRAZOLAM 0.5 MG PO TABS: 0.5000 mg | ORAL_TABLET | Freq: Every day | ORAL | 0 refills | Status: DC | PRN

## 2017-05-04 NOTE — Patient Instructions (Signed)
We have replaced the valsartan with losartan which is very similar but not recalled.   We have sent in the alprazolam for the flying which you can take 30 minutes before the flight.

## 2017-05-05 NOTE — Assessment & Plan Note (Signed)
Checking lipid panel and adjust pravastatin 20 mg daily as needed for LDL <100. (ideal <70).

## 2017-05-05 NOTE — Assessment & Plan Note (Signed)
BP at goal, per patient request will change to losartan/hctz due to concerns about safety with recall of valsartan. Checking CMP and adjust as needed.

## 2017-05-05 NOTE — Assessment & Plan Note (Addendum)
Checking HgA1c and adjust her metformin 500 mg daily as needed. Not complicated. Foot exam done at visit.

## 2017-05-05 NOTE — Progress Notes (Signed)
   Subjective:    Patient ID: Kimberly Welch, female    DOB: 1950/12/12, 66 y.o.   MRN: [id number]  HPI The patient is a 66 YO female coming in for follow up of several conditions including her diabetes (needs labs, taking metformin and on ARB and statin, not complicated, no new numbness or weakness), and her blood pressure (taking valsartan/hctz and wants to switch due to the recent recall although her batch was not affected, denies headaches, chest pains), and her cholesterol (taking pravastatin, denies side effects, needs lipid panel, goal LDL <100). No new concerns.   Review of Systems  Constitutional: Negative.   HENT: Negative.   Eyes: Negative.   Respiratory: Negative for cough, chest tightness and shortness of breath.   Cardiovascular: Negative for chest pain, palpitations and leg swelling.  Gastrointestinal: Negative for abdominal distention, abdominal pain, constipation, diarrhea, nausea and vomiting.  Musculoskeletal: Negative.   Skin: Negative.   Neurological: Negative.   Psychiatric/Behavioral: Negative.       Objective:   Physical Exam  Constitutional: She is oriented to person, place, and time. She appears well-developed and well-nourished.  HENT:  Head: Normocephalic and atraumatic.  Eyes: EOM are normal.  Neck: Normal range of motion.  Cardiovascular: Normal rate and regular rhythm.   Pulmonary/Chest: Effort normal and breath sounds normal. No respiratory distress. She has no wheezes. She has no rales.  Abdominal: Soft. Bowel sounds are normal. She exhibits no distension. There is no tenderness. There is no rebound.  Musculoskeletal: She exhibits no edema.  Neurological: She is alert and oriented to person, place, and time. Coordination normal.  Skin: Skin is warm and dry.  Psychiatric: She has a normal mood and affect.   Vitals:   05/04/17 1557  BP: 132/84  Pulse: 84  Temp: 97.7 F (36.5 C)  TempSrc: Oral  SpO2: 98%  Weight: 152 lb (68.9 kg)  Height: 4\' 11"   (1.499 m)      Assessment & Plan:

## 2017-06-14 ENCOUNTER — Other Ambulatory Visit: Payer: Self-pay | Admitting: Internal Medicine

## 2017-10-20 ENCOUNTER — Other Ambulatory Visit: Payer: Self-pay

## 2017-10-20 MED ORDER — METFORMIN HCL 500 MG PO TABS: ORAL_TABLET | ORAL | 1 refills | Status: DC

## 2017-12-22 DIAGNOSIS — H40013 Open angle with borderline findings, low risk, bilateral: Secondary | ICD-10-CM | POA: Diagnosis not present

## 2018-01-22 DIAGNOSIS — H40013 Open angle with borderline findings, low risk, bilateral: Secondary | ICD-10-CM | POA: Diagnosis not present

## 2018-01-26 DIAGNOSIS — R102 Pelvic and perineal pain: Secondary | ICD-10-CM | POA: Diagnosis not present

## 2018-01-26 DIAGNOSIS — Z01411 Encounter for gynecological examination (general) (routine) with abnormal findings: Secondary | ICD-10-CM | POA: Diagnosis not present

## 2018-01-26 DIAGNOSIS — D259 Leiomyoma of uterus, unspecified: Secondary | ICD-10-CM | POA: Diagnosis not present

## 2018-01-26 DIAGNOSIS — H409 Unspecified glaucoma: Secondary | ICD-10-CM | POA: Insufficient documentation

## 2018-01-26 DIAGNOSIS — Z6830 Body mass index (BMI) 30.0-30.9, adult: Secondary | ICD-10-CM | POA: Diagnosis not present

## 2018-01-26 DIAGNOSIS — Z1231 Encounter for screening mammogram for malignant neoplasm of breast: Secondary | ICD-10-CM | POA: Diagnosis not present

## 2018-01-26 DIAGNOSIS — Z124 Encounter for screening for malignant neoplasm of cervix: Secondary | ICD-10-CM | POA: Diagnosis not present

## 2018-01-26 DIAGNOSIS — R87611 Atypical squamous cells cannot exclude high grade squamous intraepithelial lesion on cytologic smear of cervix (ASC-H): Secondary | ICD-10-CM | POA: Diagnosis not present

## 2018-01-26 LAB — HM MAMMOGRAPHY

## 2018-02-09 ENCOUNTER — Encounter: Payer: Self-pay | Admitting: Internal Medicine

## 2018-02-09 ENCOUNTER — Ambulatory Visit (INDEPENDENT_AMBULATORY_CARE_PROVIDER_SITE_OTHER): Payer: Medicare Other | Admitting: Internal Medicine

## 2018-02-09 ENCOUNTER — Other Ambulatory Visit (INDEPENDENT_AMBULATORY_CARE_PROVIDER_SITE_OTHER): Payer: Medicare Other

## 2018-02-09 VITALS — BP 130/82 | HR 88 | Temp 97.7°F | Ht 59.0 in | Wt 149.0 lb

## 2018-02-09 DIAGNOSIS — I10 Essential (primary) hypertension: Secondary | ICD-10-CM

## 2018-02-09 DIAGNOSIS — E785 Hyperlipidemia, unspecified: Secondary | ICD-10-CM

## 2018-02-09 DIAGNOSIS — Z23 Encounter for immunization: Secondary | ICD-10-CM

## 2018-02-09 DIAGNOSIS — E1169 Type 2 diabetes mellitus with other specified complication: Secondary | ICD-10-CM

## 2018-02-09 DIAGNOSIS — E669 Obesity, unspecified: Secondary | ICD-10-CM

## 2018-02-09 DIAGNOSIS — Z0000 Encounter for general adult medical examination without abnormal findings: Secondary | ICD-10-CM | POA: Diagnosis not present

## 2018-02-09 DIAGNOSIS — Z1211 Encounter for screening for malignant neoplasm of colon: Secondary | ICD-10-CM

## 2018-02-09 LAB — CBC
HCT: 36.1 % (ref 36.0–46.0)
HEMOGLOBIN: 11.7 g/dL — AB (ref 12.0–15.0)
MCHC: 32.4 g/dL (ref 30.0–36.0)
MCV: 87 fl (ref 78.0–100.0)
Platelets: 282 10*3/uL (ref 150.0–400.0)
RBC: 4.15 Mil/uL (ref 3.87–5.11)
RDW: 13.4 % (ref 11.5–15.5)
WBC: 7.3 10*3/uL (ref 4.0–10.5)

## 2018-02-09 LAB — LIPID PANEL
CHOLESTEROL: 147 mg/dL (ref 0–200)
HDL: 41.7 mg/dL (ref >39.00)
LDL Cholesterol: 80 mg/dL (ref 0–99)
NONHDL: 105.49
TRIGLYCERIDES: 129 mg/dL (ref 0.0–149.0)
Total CHOL/HDL Ratio: 4
VLDL: 25.8 mg/dL (ref 0.0–40.0)

## 2018-02-09 LAB — COMPREHENSIVE METABOLIC PANEL
ALBUMIN: 4.4 g/dL (ref 3.5–5.2)
ALT: 8 U/L (ref 0–35)
AST: 14 U/L (ref 0–37)
Alkaline Phosphatase: 75 U/L (ref 39–117)
BUN: 12 mg/dL (ref 6–23)
CHLORIDE: 100 meq/L (ref 96–112)
CO2: 31 meq/L (ref 19–32)
Calcium: 10 mg/dL (ref 8.4–10.5)
Creatinine, Ser: 0.88 mg/dL (ref 0.40–1.20)
GFR: 82.31 mL/min (ref >60.00)
GLUCOSE: 109 mg/dL — AB (ref 70–99)
POTASSIUM: 4 meq/L (ref 3.5–5.1)
SODIUM: 138 meq/L (ref 135–145)
Total Bilirubin: 0.4 mg/dL (ref 0.2–1.2)
Total Protein: 7.5 g/dL (ref 6.0–8.3)

## 2018-02-09 LAB — HEMOGLOBIN A1C: HEMOGLOBIN A1C: 6.5 % (ref 4.6–6.5)

## 2018-02-09 MED ORDER — CARBAMIDE PEROXIDE 6.5 % OT SOLN: 5.0000 [drp] | Freq: Two times a day (BID) | OTIC | 0 refills | Status: DC

## 2018-02-09 NOTE — Patient Instructions (Addendum)
We will get you in for the colonoscopy.   We have given you the tetanus and the pneumonia shot today.   Health Maintenance, Female Adopting a healthy lifestyle and getting preventive care can go a long way to promote health and wellness. Talk with your health care provider about what schedule of regular examinations is right for you. This is a good chance for you to check in with your provider about disease prevention and staying healthy. In between checkups, there are plenty of things you can do on your own. Experts have done a lot of research about which lifestyle changes and preventive measures are most likely to keep you healthy. Ask your health care provider for more information. Weight and diet Eat a healthy diet  Be sure to include plenty of vegetables, fruits, low-fat dairy products, and lean protein.  Do not eat a lot of foods high in solid fats, added sugars, or salt.  Get regular exercise. This is one of the most important things you can do for your health. ? Most adults should exercise for at least 150 minutes each week. The exercise should increase your heart rate and make you sweat (moderate-intensity exercise). ? Most adults should also do strengthening exercises at least twice a week. This is in addition to the moderate-intensity exercise.  Maintain a healthy weight  Body mass index (BMI) is a measurement that can be used to identify possible weight problems. It estimates body fat based on height and weight. Your health care provider can help determine your BMI and help you achieve or maintain a healthy weight.  For females 90 years of age and older: ? A BMI below 18.5 is considered underweight. ? A BMI of 18.5 to 24.9 is normal. ? A BMI of 25 to 29.9 is considered overweight. ? A BMI of 30 and above is considered obese.  Watch levels of cholesterol and blood lipids  You should start having your blood tested for lipids and cholesterol at 67 years of age, then have this  test every 5 years.  You may need to have your cholesterol levels checked more often if: ? Your lipid or cholesterol levels are high. ? You are older than 67 years of age. ? You are at high risk for heart disease.  Cancer screening Lung Cancer  Lung cancer screening is recommended for adults 75-72 years old who are at high risk for lung cancer because of a history of smoking.  A yearly low-dose CT scan of the lungs is recommended for people who: ? Currently smoke. ? Have quit within the past 15 years. ? Have at least a 30-pack-year history of smoking. A pack year is smoking an average of one pack of cigarettes a day for 1 year.  Yearly screening should continue until it has been 15 years since you quit.  Yearly screening should stop if you develop a health problem that would prevent you from having lung cancer treatment.  Breast Cancer  Practice breast self-awareness. This means understanding how your breasts normally appear and feel.  It also means doing regular breast self-exams. Let your health care provider know about any changes, no matter how small.  If you are in your 20s or 30s, you should have a clinical breast exam (CBE) by a health care provider every 1-3 years as part of a regular health exam.  If you are 37 or older, have a CBE every year. Also consider having a breast X-ray (mammogram) every year.  If you  have a family history of breast cancer, talk to your health care provider about genetic screening.  If you are at high risk for breast cancer, talk to your health care provider about having an MRI and a mammogram every year.  Breast cancer gene (BRCA) assessment is recommended for women who have family members with BRCA-related cancers. BRCA-related cancers include: ? Breast. ? Ovarian. ? Tubal. ? Peritoneal cancers.  Results of the assessment will determine the need for genetic counseling and BRCA1 and BRCA2 testing.  Cervical Cancer Your health care  provider may recommend that you be screened regularly for cancer of the pelvic organs (ovaries, uterus, and vagina). This screening involves a pelvic examination, including checking for microscopic changes to the surface of your cervix (Pap test). You may be encouraged to have this screening done every 3 years, beginning at age 62.  For women ages 58-65, health care providers may recommend pelvic exams and Pap testing every 3 years, or they may recommend the Pap and pelvic exam, combined with testing for human papilloma virus (HPV), every 5 years. Some types of HPV increase your risk of cervical cancer. Testing for HPV may also be done on women of any age with unclear Pap test results.  Other health care providers may not recommend any screening for nonpregnant women who are considered low risk for pelvic cancer and who do not have symptoms. Ask your health care provider if a screening pelvic exam is right for you.  If you have had past treatment for cervical cancer or a condition that could lead to cancer, you need Pap tests and screening for cancer for at least 20 years after your treatment. If Pap tests have been discontinued, your risk factors (such as having a new sexual partner) need to be reassessed to determine if screening should resume. Some women have medical problems that increase the chance of getting cervical cancer. In these cases, your health care provider may recommend more frequent screening and Pap tests.  Colorectal Cancer  This type of cancer can be detected and often prevented.  Routine colorectal cancer screening usually begins at 67 years of age and continues through 67 years of age.  Your health care provider may recommend screening at an earlier age if you have risk factors for colon cancer.  Your health care provider may also recommend using home test kits to check for hidden blood in the stool.  A small camera at the end of a tube can be used to examine your colon  directly (sigmoidoscopy or colonoscopy). This is done to check for the earliest forms of colorectal cancer.  Routine screening usually begins at age 92.  Direct examination of the colon should be repeated every 5-10 years through 67 years of age. However, you may need to be screened more often if early forms of precancerous polyps or small growths are found.  Skin Cancer  Check your skin from head to toe regularly.  Tell your health care provider about any new moles or changes in moles, especially if there is a change in a mole's shape or color.  Also tell your health care provider if you have a mole that is larger than the size of a pencil eraser.  Always use sunscreen. Apply sunscreen liberally and repeatedly throughout the day.  Protect yourself by wearing long sleeves, pants, a wide-brimmed hat, and sunglasses whenever you are outside.  Heart disease, diabetes, and high blood pressure  High blood pressure causes heart disease and increases the  risk of stroke. High blood pressure is more likely to develop in: ? People who have blood pressure in the high end of the normal range (130-139/85-89 mm Hg). ? People who are overweight or obese. ? People who are African American.  If you are 35-9 years of age, have your blood pressure checked every 3-5 years. If you are 33 years of age or older, have your blood pressure checked every year. You should have your blood pressure measured twice-once when you are at a hospital or clinic, and once when you are not at a hospital or clinic. Record the average of the two measurements. To check your blood pressure when you are not at a hospital or clinic, you can use: ? An automated blood pressure machine at a pharmacy. ? A home blood pressure monitor.  If you are between 60 years and 104 years old, ask your health care provider if you should take aspirin to prevent strokes.  Have regular diabetes screenings. This involves taking a blood sample to  check your fasting blood sugar level. ? If you are at a normal weight and have a low risk for diabetes, have this test once every three years after 67 years of age. ? If you are overweight and have a high risk for diabetes, consider being tested at a younger age or more often. Preventing infection Hepatitis B  If you have a higher risk for hepatitis B, you should be screened for this virus. You are considered at high risk for hepatitis B if: ? You were born in a country where hepatitis B is common. Ask your health care provider which countries are considered high risk. ? Your parents were born in a high-risk country, and you have not been immunized against hepatitis B (hepatitis B vaccine). ? You have HIV or AIDS. ? You use needles to inject street drugs. ? You live with someone who has hepatitis B. ? You have had sex with someone who has hepatitis B. ? You get hemodialysis treatment. ? You take certain medicines for conditions, including cancer, organ transplantation, and autoimmune conditions.  Hepatitis C  Blood testing is recommended for: ? Everyone born from [birth year] through 1965. ? Anyone with known risk factors for hepatitis C.  Sexually transmitted infections (STIs)  You should be screened for sexually transmitted infections (STIs) including gonorrhea and chlamydia if: ? You are sexually active and are younger than 67 years of age. ? You are older than 67 years of age and your health care provider tells you that you are at risk for this type of infection. ? Your sexual activity has changed since you were last screened and you are at an increased risk for chlamydia or gonorrhea. Ask your health care provider if you are at risk.  If you do not have HIV, but are at risk, it may be recommended that you take a prescription medicine daily to prevent HIV infection. This is called pre-exposure prophylaxis (PrEP). You are considered at risk if: ? You are sexually active and do not regularly  use condoms or know the HIV status of your partner(s). ? You take drugs by injection. ? You are sexually active with a partner who has HIV.  Talk with your health care provider about whether you are at high risk of being infected with HIV. If you choose to begin PrEP, you should first be tested for HIV. You should then be tested every 3 months for as long as you are taking PrEP. Pregnancy  If you are premenopausal and you may become pregnant, ask your health care provider about preconception counseling.  If you may become pregnant, take 400 to 800 micrograms (mcg) of folic acid every day.  If you want to prevent pregnancy, talk to your health care provider about birth control (contraception). Osteoporosis and menopause  Osteoporosis is a disease in which the bones lose minerals and strength with aging. This can result in serious bone fractures. Your risk for osteoporosis can be identified using a bone density scan.  If you are 36 years of age or older, or if you are at risk for osteoporosis and fractures, ask your health care provider if you should be screened.  Ask your health care provider whether you should take a calcium or vitamin D supplement to lower your risk for osteoporosis.  Menopause may have certain physical symptoms and risks.  Hormone replacement therapy may reduce some of these symptoms and risks. Talk to your health care provider about whether hormone replacement therapy is right for you. Follow these instructions at home:  Schedule regular health, dental, and eye exams.  Stay current with your immunizations.  Do not use any tobacco products including cigarettes, chewing tobacco, or electronic cigarettes.  If you are pregnant, do not drink alcohol.  If you are breastfeeding, limit how much and how often you drink alcohol.  Limit alcohol intake to no more than 1 drink per day for nonpregnant women. One drink equals 12 ounces of beer, 5 ounces of wine, or 1 ounces  of hard liquor.  Do not use street drugs.  Do not share needles.  Ask your health care provider for help if you need support or information about quitting drugs.  Tell your health care provider if you often feel depressed.  Tell your health care provider if you have ever been abused or do not feel safe at home. This information is not intended to replace advice given to you by your health care provider. Make sure you discuss any questions you have with your health care provider. Document Released: 02/03/2011 Document Revised: 12/27/2015 Document Reviewed: 04/24/2015 Elsevier Interactive Patient Education  [employer].

## 2018-02-09 NOTE — Progress Notes (Signed)
   Subjective:    Patient ID: Kimberly Welch, female    DOB: [birth year], 67 y.o.   MRN: [id number]  HPI Here for medicare wellness, no new complaints. Please see A/P for status and treatment of chronic medical problems.   HPI #2: Here for follow up of diabetes (taking metformin, on arb and statin, denies new numbness or weakness, previously well controlled), and her blood pressure (taking losartan/hctz and BP at goal, denies headaches or chest pains, denies dizziness or lightheadedness), and her cholesterol (taking pravastatin daily, denies muscle aches, denies stroke symptoms).   Diet: DM since diabetic Physical activity: sedentary Depression/mood screen: negative Hearing: intact to whispered voice Visual acuity: grossly normal, performs annual eye exam  ADLs: capable Fall risk: none Home safety: good Cognitive evaluation: intact to orientation, naming, recall and repetition EOL planning: adv directives discussed  I have personally reviewed and have noted 1. The patient's medical and social history - reviewed today no changes 2. Their use of alcohol, tobacco or illicit drugs 3. Their current medications and supplements 4. The patient's functional ability including ADL's, fall risks, home safety risks and hearing or visual impairment. 5. Diet and physical activities 6. Evidence for depression or mood disorders 7. Care team reviewed and updated (available in snapshot)  Review of Systems  Constitutional: Negative.   HENT: Negative.   Eyes: Negative.   Respiratory: Negative for cough, chest tightness and shortness of breath.   Cardiovascular: Negative for chest pain, palpitations and leg swelling.  Gastrointestinal: Negative for abdominal distention, abdominal pain, constipation, diarrhea, nausea and vomiting.  Musculoskeletal: Negative.   Skin: Negative.   Neurological: Negative.   Psychiatric/Behavioral: Negative.       Objective:   Physical Exam  Constitutional: She is  oriented to person, place, and time. She appears well-developed and well-nourished.  HENT:  Head: Normocephalic and atraumatic.  Eyes: EOM are normal.  Neck: Normal range of motion.  Cardiovascular: Normal rate and regular rhythm.  Pulmonary/Chest: Effort normal and breath sounds normal. No respiratory distress. She has no wheezes. She has no rales.  Abdominal: Soft. Bowel sounds are normal. She exhibits no distension. There is no tenderness. There is no rebound.  Musculoskeletal: She exhibits no edema.  Neurological: She is alert and oriented to person, place, and time. Coordination normal.  Skin: Skin is warm and dry.  Psychiatric: She has a normal mood and affect.   Vitals:   02/09/18 1017  BP: 130/82  Pulse: 88  Temp: 97.7 F (36.5 C)  TempSrc: Oral  SpO2: 98%  Weight: 149 lb (67.6 kg)  Height: 4\' 11"  (1.499 m)      Assessment & Plan:  Tdap and prevnar 13 given at visit

## 2018-02-10 DIAGNOSIS — R87611 Atypical squamous cells cannot exclude high grade squamous intraepithelial lesion on cytologic smear of cervix (ASC-H): Secondary | ICD-10-CM | POA: Diagnosis not present

## 2018-02-10 DIAGNOSIS — R87610 Atypical squamous cells of undetermined significance on cytologic smear of cervix (ASC-US): Secondary | ICD-10-CM | POA: Diagnosis not present

## 2018-02-11 ENCOUNTER — Encounter: Payer: Self-pay | Admitting: Internal Medicine

## 2018-02-11 NOTE — Progress Notes (Unsigned)
Abstracted and sent to scan  

## 2018-02-12 NOTE — Assessment & Plan Note (Signed)
Taking losartan/hctz and BP is at goal. Checking CMP and adjust as needed.

## 2018-02-12 NOTE — Assessment & Plan Note (Signed)
Taking pravastatin 20 mg daily. Checking lipid panel and adjust as needed for LDL goal <70.

## 2018-02-12 NOTE — Assessment & Plan Note (Signed)
Taking metformin for control. On ARB and statin. Checking HgA1c and adjust as needed. Eye exam up to date. Foot exam done today. Complicated by hyperlipidemia.

## 2018-02-12 NOTE — Assessment & Plan Note (Signed)
Flu shot yearly. Pneumonia 13 given at visit. Shingrix counseled. Tetanus given at visit. Colonoscopy referral placed today as due this year. Mammogram counseled, pap smear not indicated and dexa counseled. Counseled about sun safety and mole surveillance. Counseled about the dangers of distracted driving. Given 10 year screening recommendations.

## 2018-02-24 DIAGNOSIS — R9389 Abnormal findings on diagnostic imaging of other specified body structures: Secondary | ICD-10-CM | POA: Diagnosis not present

## 2018-02-24 DIAGNOSIS — R87610 Atypical squamous cells of undetermined significance on cytologic smear of cervix (ASC-US): Secondary | ICD-10-CM | POA: Diagnosis not present

## 2018-02-24 DIAGNOSIS — R102 Pelvic and perineal pain: Secondary | ICD-10-CM | POA: Diagnosis not present

## 2018-03-02 DIAGNOSIS — H40013 Open angle with borderline findings, low risk, bilateral: Secondary | ICD-10-CM | POA: Diagnosis not present

## 2018-03-17 ENCOUNTER — Encounter: Payer: Self-pay | Admitting: Internal Medicine

## 2018-03-31 ENCOUNTER — Other Ambulatory Visit: Payer: Self-pay | Admitting: Obstetrics and Gynecology

## 2018-04-21 ENCOUNTER — Encounter (HOSPITAL_BASED_OUTPATIENT_CLINIC_OR_DEPARTMENT_OTHER): Payer: Self-pay | Admitting: *Deleted

## 2018-04-21 ENCOUNTER — Other Ambulatory Visit: Payer: Self-pay

## 2018-04-21 NOTE — Progress Notes (Signed)
Spoke with patient via telephone for pre op interview. NPO after MN. Patient to take prilosec with a sip of water AM of surgery. Needs ISTAT 8 AM of surgery. To arrive at 0530.

## 2018-04-25 ENCOUNTER — Other Ambulatory Visit: Payer: Self-pay | Admitting: Internal Medicine

## 2018-04-26 ENCOUNTER — Encounter (HOSPITAL_BASED_OUTPATIENT_CLINIC_OR_DEPARTMENT_OTHER): Payer: Self-pay | Admitting: Anesthesiology

## 2018-04-26 NOTE — H&P (Signed)
Kimberly Welch is an 67 y.o. female. Presents for evaluation of endometrial thickening on ultrasound with known fibroids.  Pt under went pelvic ultrasound for evaluation of transient intermittent pelvic pain.  Uterine fibroids were identified and endometrial thickness of 50mm noted with distortion of endomtrial cavity by an intramural fibroid.  Pt presents for hysteroscopic evaluation of the endometrial cavity and possible resection of endometrial mass if identified.  Pertinent Gynecological History: Menses: post-menopausal Bleeding: none Contraception: menopause DES exposure: unknown Blood transfusions: none Sexually transmitted diseases: no past history Previous GYN Procedures: none  Last mammogram: normal Date: 2019 Last pap: abnormal: atypical squamous cells, cannot r/o high grade lesions Date: 01/2018.  Followed up with colposcopically directed biopsies and endocervical curettage, all of which were negative. OB History: G1, P1   Menstrual History: Menarche age: 12 Patient's last menstrual period was 08/05/2003.    Past Medical History:  Diagnosis Date  . Cholelithiasis    symptomatic  . GERD (gastroesophageal reflux disease)   . Hyperlipidemia   . Hypertension   . Type 2 diabetes mellitus ([place])   . Wears partial dentures    UPPER AND LOWER    Past Surgical History:  Procedure Laterality Date  . CHOLECYSTECTOMY N/A 11/28/2015   Procedure: LAPAROSCOPIC CHOLECYSTECTOMY;  Surgeon: Mickeal Skinner, MD;  Location: [facility];  Service: General;  Laterality: N/A;  . D & C HYSTEROSCOPY/  RESECTION ENDOMETRIAL POLYP'S  01-11-2009    History reviewed. No pertinent family history.  Social History:  reports that she has never smoked. She has never used smokeless tobacco. She reports that she drinks alcohol. She reports that she does not use drugs.  Allergies:  Allergies  Allergen Reactions  . Sulfa Antibiotics Swelling    Medications Prior to Admission   Medication Sig Dispense Refill Last Dose  . carbamide peroxide (DEBROX) 6.5 % OTIC solution Place 5 drops into the right ear 2 (two) times daily. 15 mL 0 Past Month at Unknown time  . losartan-hydrochlorothiazide (HYZAAR) 50-12.5 MG tablet TAKE 1 TABLET BY MOUTH EVERY DAY 90 tablet 3 04/26/2018 at Unknown time  . metFORMIN (GLUCOPHAGE) 500 MG tablet TAKE 1 TABLET BY MOUTH EVERY DAY WITH BREAKFAST 90 tablet 1 04/26/2018 at Unknown time  . naphazoline-pheniramine (NAPHCON-A) 0.025-0.3 % ophthalmic solution Place 2 drops into both eyes 4 (four) times daily as needed for irritation. Until symptoms resolve 15 mL 0 Past Week at Unknown time  . omeprazole (PRILOSEC) 20 MG capsule Take 20 mg by mouth as needed. OTC   04/27/2018 at 0445  . pravastatin (PRAVACHOL) 20 MG tablet TAKE 1 TABLET BY MOUTH EVERY DAY 90 tablet 0 04/26/2018 at Unknown time    Review of Systems  Constitutional: Negative.   HENT: Negative.   Respiratory: Negative.   Cardiovascular: Negative.   Gastrointestinal: Negative.   Genitourinary:       Transient intermittent mild pelvic pain, not requiring management which occurred first in 11/2017, worked up with pelvic u/s showing fibroids and no adnexal masses.  Neurological: Negative.   Endo/Heme/Allergies: Negative.   Psychiatric/Behavioral: Negative.     Blood pressure 130/79, pulse 76, temperature 97.6 F (36.4 C), temperature source Oral, resp. rate 16, height 4\' 11"  (1.499 m), weight 68.1 kg, last menstrual period 08/05/2003, SpO2 100 %. Physical Exam  Constitutional: She is oriented to person, place, and time. She appears well-developed.  HENT:  Head: Normocephalic and atraumatic.  Eyes: EOM are normal.  Neck: Normal range of motion. Neck supple.  Cardiovascular: Normal  rate and regular rhythm.  Respiratory: Effort normal and breath sounds normal.  GI: Soft. Bowel sounds are normal.  Genitourinary:  Genitourinary Comments: Pelvic exam:  VULVA: normal appearing vulva with  no masses, tenderness or lesions,  VAGINA: normal appearing vagina with normal color and discharge, no lesions,  CERVIX: normal appearing cervix without discharge or lesions, descends to within 2 cm of introitus,  UTERUS: enlarged to 10 week's size, irregular, mobile, ADNEXA: no masses.  Musculoskeletal: Normal range of motion.  Neurological: She is alert and oriented to person, place, and time.  Skin: Skin is warm and dry.  Psychiatric: She has a normal mood and affect.    Results for orders placed or performed during the hospital encounter of 04/27/18 (from the past 24 hour(s))  I-STAT, chem 8     Status: Abnormal   Collection Time: 04/27/18  6:44 AM  Result Value Ref Range   Sodium 139 135 - 145 mmol/L   Potassium 3.7 3.5 - 5.1 mmol/L   Chloride 99 98 - 111 mmol/L   BUN 17 8 - 23 mg/dL   Creatinine, Ser 1.00 0.44 - 1.00 mg/dL   Glucose, Bld 110 (H) 70 - 99 mg/dL   Calcium, Ion 1.27 1.15 - 1.40 mmol/L   TCO2 29 22 - 32 mmol/L   Hemoglobin 12.9 12.0 - 15.0 g/dL   HCT 38.0 36.0 - 46.0 %     Assessment/Plan: 1)Transient intermittent mild pelvic pain, not requiring management which occurred first in 11/2017, worked up with pelvic u/s showing fibroids and no adnexal masses.  2) abnormal endometrial thickness noted on u/s with probable intramural fibroid causing endometrial distortion.  3) ASC-H with neg HR HPV on pap with neg colpscopically directed biopsies and ECC.  Recommendation: 1) Hysteroscopy with D&C and possible resection of endometrial mass recommended and accepted.  Risks of anesthesia, bleeding infection, damage to adjacent organs and the specific risk of uterine perforation with possible  need for corrective surgery reviewed.  Seymour Bars Duvan Mousel 04/27/2018, 7:21 AM

## 2018-04-26 NOTE — Anesthesia Preprocedure Evaluation (Addendum)
Anesthesia Evaluation  Patient identified by MRN, date of birth, ID band Patient awake    Reviewed: Allergy & Precautions, NPO status , Patient's Chart, lab work & pertinent test results  Airway Mallampati: I  TM Distance: >3 FB Neck ROM: Full    Dental  (+) Upper Dentures, Lower Dentures   Pulmonary neg pulmonary ROS,    breath sounds clear to auscultation       Cardiovascular hypertension,  Rhythm:Regular Rate:Normal     Neuro/Psych negative neurological ROS     GI/Hepatic Neg liver ROS, GERD  Medicated,  Endo/Other  diabetes, Type 2, Oral Hypoglycemic Agents  Renal/GU negative Renal ROS  negative genitourinary   Musculoskeletal negative musculoskeletal ROS (+)   Abdominal (+) + obese,   Peds  Hematology negative hematology ROS (+)   Anesthesia Other Findings   Reproductive/Obstetrics                            Lab Results  Component Value Date   WBC 7.3 02/09/2018   HGB 11.7 (L) 02/09/2018   HCT 36.1 02/09/2018   MCV 87.0 02/09/2018   PLT 282.0 02/09/2018   Lab Results  Component Value Date   CREATININE 0.88 02/09/2018   BUN 12 02/09/2018   NA 138 02/09/2018   K 4.0 02/09/2018   CL 100 02/09/2018   CO2 31 02/09/2018   No results found for: INR, PROTIME  EKG: normal sinus rhythm.  Anesthesia Physical Anesthesia Plan  ASA: II  Anesthesia Plan: General   Post-op Pain Management:    Induction: Intravenous  PONV Risk Score and Plan: 4 or greater and Ondansetron, Dexamethasone, Midazolam and Scopolamine patch - Pre-op  Airway Management Planned: LMA  Additional Equipment: None  Intra-op Plan:   Post-operative Plan: Extubation in OR  Informed Consent: I have reviewed the patients History and Physical, chart, labs and discussed the procedure including the risks, benefits and alternatives for the proposed anesthesia with the patient or authorized representative who  has indicated his/her understanding and acceptance.   Dental advisory given  Plan Discussed with: CRNA  Anesthesia Plan Comments:        Anesthesia Quick Evaluation

## 2018-04-27 ENCOUNTER — Ambulatory Visit (HOSPITAL_BASED_OUTPATIENT_CLINIC_OR_DEPARTMENT_OTHER): Payer: Medicare Other | Admitting: Anesthesiology

## 2018-04-27 ENCOUNTER — Ambulatory Visit (HOSPITAL_BASED_OUTPATIENT_CLINIC_OR_DEPARTMENT_OTHER)
Admission: RE | Admit: 2018-04-27 | Discharge: 2018-04-27 | Disposition: A | Discharge status: Home / Self Care | Payer: Medicare Other | Source: Ambulatory Visit | Attending: Obstetrics and Gynecology | Admitting: Obstetrics and Gynecology

## 2018-04-27 ENCOUNTER — Encounter (HOSPITAL_BASED_OUTPATIENT_CLINIC_OR_DEPARTMENT_OTHER): Payer: Self-pay | Admitting: *Deleted

## 2018-04-27 ENCOUNTER — Encounter (HOSPITAL_BASED_OUTPATIENT_CLINIC_OR_DEPARTMENT_OTHER)
Admission: RE | Disposition: A | Discharge status: Home / Self Care | Payer: Self-pay | Source: Ambulatory Visit | Attending: Obstetrics and Gynecology

## 2018-04-27 DIAGNOSIS — E119 Type 2 diabetes mellitus without complications: Secondary | ICD-10-CM | POA: Insufficient documentation

## 2018-04-27 DIAGNOSIS — R9389 Abnormal findings on diagnostic imaging of other specified body structures: Secondary | ICD-10-CM | POA: Insufficient documentation

## 2018-04-27 DIAGNOSIS — Z79899 Other long term (current) drug therapy: Secondary | ICD-10-CM | POA: Diagnosis not present

## 2018-04-27 DIAGNOSIS — R102 Pelvic and perineal pain: Secondary | ICD-10-CM | POA: Insufficient documentation

## 2018-04-27 DIAGNOSIS — E785 Hyperlipidemia, unspecified: Secondary | ICD-10-CM | POA: Insufficient documentation

## 2018-04-27 DIAGNOSIS — K219 Gastro-esophageal reflux disease without esophagitis: Secondary | ICD-10-CM | POA: Insufficient documentation

## 2018-04-27 DIAGNOSIS — R935 Abnormal findings on diagnostic imaging of other abdominal regions, including retroperitoneum: Secondary | ICD-10-CM | POA: Diagnosis present

## 2018-04-27 DIAGNOSIS — D259 Leiomyoma of uterus, unspecified: Secondary | ICD-10-CM | POA: Diagnosis present

## 2018-04-27 DIAGNOSIS — N840 Polyp of corpus uteri: Secondary | ICD-10-CM | POA: Insufficient documentation

## 2018-04-27 DIAGNOSIS — R87611 Atypical squamous cells cannot exclude high grade squamous intraepithelial lesion on cytologic smear of cervix (ASC-H): Secondary | ICD-10-CM | POA: Insufficient documentation

## 2018-04-27 DIAGNOSIS — D219 Benign neoplasm of connective and other soft tissue, unspecified: Secondary | ICD-10-CM | POA: Diagnosis present

## 2018-04-27 DIAGNOSIS — I10 Essential (primary) hypertension: Secondary | ICD-10-CM | POA: Diagnosis not present

## 2018-04-27 DIAGNOSIS — Z7984 Long term (current) use of oral hypoglycemic drugs: Secondary | ICD-10-CM | POA: Diagnosis not present

## 2018-04-27 HISTORY — PX: DILATATION & CURRETTAGE/HYSTEROSCOPY WITH RESECTOCOPE: SHX5572

## 2018-04-27 LAB — POCT I-STAT, CHEM 8
BUN: 17 mg/dL (ref 8–23)
CREATININE: 1 mg/dL (ref 0.44–1.00)
Calcium, Ion: 1.27 mmol/L (ref 1.15–1.40)
Chloride: 99 mmol/L (ref 98–111)
Glucose, Bld: 110 mg/dL — ABNORMAL HIGH (ref 70–99)
HEMATOCRIT: 38 % (ref 36.0–46.0)
Hemoglobin: 12.9 g/dL (ref 12.0–15.0)
POTASSIUM: 3.7 mmol/L (ref 3.5–5.1)
Sodium: 139 mmol/L (ref 135–145)
TCO2: 29 mmol/L (ref 22–32)

## 2018-04-27 LAB — GLUCOSE, CAPILLARY: GLUCOSE-CAPILLARY: 106 mg/dL — AB (ref 70–99)

## 2018-04-27 SURGERY — DILATATION & CURETTAGE/HYSTEROSCOPY WITH RESECTOCOPE
Anesthesia: General | Site: Vagina

## 2018-04-27 MED ORDER — LACTATED RINGERS IV SOLN
INTRAVENOUS | Status: DC
  Filled 2018-04-27: qty 1000

## 2018-04-27 MED ORDER — LIDOCAINE HCL 2 % IJ SOLN
INTRAMUSCULAR | Status: DC | PRN
  Administered 2018-04-27: 10 mL

## 2018-04-27 MED ORDER — LACTATED RINGERS IV SOLN
INTRAVENOUS | Status: DC
  Administered 2018-04-27: 07:00:00 via INTRAVENOUS
  Filled 2018-04-27: qty 1000

## 2018-04-27 MED ORDER — SODIUM CHLORIDE 0.9 % IR SOLN
Status: DC | PRN
  Administered 2018-04-27: 3000 mL

## 2018-04-27 MED ORDER — PROMETHAZINE HCL 25 MG/ML IJ SOLN
6.2500 mg | INTRAMUSCULAR | Status: DC | PRN
  Filled 2018-04-27: qty 1

## 2018-04-27 MED ORDER — SILVER NITRATE-POT NITRATE 75-25 % EX MISC
CUTANEOUS | Status: DC | PRN
  Administered 2018-04-27: 1

## 2018-04-27 MED ORDER — FENTANYL CITRATE (PF) 100 MCG/2ML IJ SOLN
INTRAMUSCULAR | Status: DC | PRN
  Administered 2018-04-27: 50 ug via INTRAVENOUS

## 2018-04-27 MED ORDER — FENTANYL CITRATE (PF) 100 MCG/2ML IJ SOLN
25.0000 ug | INTRAMUSCULAR | Status: DC | PRN
  Filled 2018-04-27: qty 1

## 2018-04-27 MED ORDER — KETOROLAC TROMETHAMINE 15 MG/ML IJ SOLN
INTRAMUSCULAR | Status: DC | PRN
  Administered 2018-04-27: 15 mg via INTRAVENOUS
  Administered 2018-04-27: 15 mg via INTRAMUSCULAR

## 2018-04-27 MED ORDER — MEPERIDINE HCL 25 MG/ML IJ SOLN
6.2500 mg | INTRAMUSCULAR | Status: DC | PRN
  Filled 2018-04-27: qty 1

## 2018-04-27 MED ORDER — PROPOFOL 10 MG/ML IV BOLUS
INTRAVENOUS | Status: DC | PRN
  Administered 2018-04-27: 150 mg via INTRAVENOUS

## 2018-04-27 MED ORDER — OXYCODONE HCL 5 MG PO TABS
5.0000 mg | ORAL_TABLET | Freq: Once | ORAL | Status: AC
  Administered 2018-04-27: 5 mg via ORAL
  Filled 2018-04-27: qty 1

## 2018-04-27 MED ORDER — KETOROLAC TROMETHAMINE 30 MG/ML IJ SOLN
INTRAMUSCULAR | Status: AC
  Filled 2018-04-27: qty 1

## 2018-04-27 MED ORDER — ONDANSETRON HCL 4 MG/2ML IJ SOLN
INTRAMUSCULAR | Status: AC
  Filled 2018-04-27: qty 2

## 2018-04-27 MED ORDER — FENTANYL CITRATE (PF) 100 MCG/2ML IJ SOLN
INTRAMUSCULAR | Status: AC
  Filled 2018-04-27: qty 2

## 2018-04-27 MED ORDER — OXYCODONE HCL 5 MG PO TABS
ORAL_TABLET | ORAL | Status: AC
  Filled 2018-04-27: qty 1

## 2018-04-27 MED ORDER — ONDANSETRON HCL 4 MG/2ML IJ SOLN
INTRAMUSCULAR | Status: DC | PRN
  Administered 2018-04-27: 4 mg via INTRAVENOUS

## 2018-04-27 MED ORDER — MIDAZOLAM HCL 2 MG/2ML IJ SOLN
INTRAMUSCULAR | Status: DC | PRN
  Administered 2018-04-27: 2 mg via INTRAVENOUS

## 2018-04-27 MED ORDER — LIDOCAINE 2% (20 MG/ML) 5 ML SYRINGE
INTRAMUSCULAR | Status: DC | PRN
  Administered 2018-04-27: 50 mg via INTRAVENOUS

## 2018-04-27 MED ORDER — MIDAZOLAM HCL 2 MG/2ML IJ SOLN
INTRAMUSCULAR | Status: AC
  Filled 2018-04-27: qty 2

## 2018-04-27 MED ORDER — PROPOFOL 10 MG/ML IV BOLUS
INTRAVENOUS | Status: AC
  Filled 2018-04-27: qty 20

## 2018-04-27 MED ORDER — IBUPROFEN 600 MG PO TABS: ORAL_TABLET | ORAL | 0 refills | Status: DC

## 2018-04-27 MED ORDER — LIDOCAINE 2% (20 MG/ML) 5 ML SYRINGE
INTRAMUSCULAR | Status: AC
  Filled 2018-04-27: qty 5

## 2018-04-27 SURGICAL SUPPLY — 23 items
BIPOLAR CUTTING LOOP 21FR (ELECTRODE)
CANISTER SUCT 3000ML PPV (MISCELLANEOUS) IMPLANT
CATH ROBINSON RED A/P 16FR (CATHETERS) ×2 IMPLANT
CLOTH BEACON ORANGE TIMEOUT ST (SAFETY) ×2 IMPLANT
COUNTER NEEDLE 1200 MAGNETIC (NEEDLE) IMPLANT
CURETTE PIPELLE ENDOMTRL SUCTN (MISCELLANEOUS) IMPLANT
DILATOR CANAL MILEX (MISCELLANEOUS) IMPLANT
GAUZE SPONGE 4X4 16PLY XRAY LF (GAUZE/BANDAGES/DRESSINGS) ×2 IMPLANT
GLOVE BIO SURGEON STRL SZ 6.5 (GLOVE) ×2 IMPLANT
GLOVE BIOGEL PI IND STRL 6.5 (GLOVE) ×1 IMPLANT
GLOVE BIOGEL PI IND STRL 7.5 (GLOVE) ×1 IMPLANT
GLOVE BIOGEL PI INDICATOR 6.5 (GLOVE) ×1
GLOVE BIOGEL PI INDICATOR 7.5 (GLOVE) ×1
GLOVE SURG SS PI 6.5 STRL IVOR (GLOVE) ×2 IMPLANT
GOWN STRL REUS W/TWL LRG LVL3 (GOWN DISPOSABLE) ×4 IMPLANT
KIT PROCEDURE FLUENT (KITS) ×2 IMPLANT
KIT TURNOVER CYSTO (KITS) ×2 IMPLANT
LOOP CUTTING BIPOLAR 21FR (ELECTRODE) IMPLANT
PACK VAGINAL MINOR WOMEN LF (CUSTOM PROCEDURE TRAY) ×2 IMPLANT
PAD OB MATERNITY 4.3X12.25 (PERSONAL CARE ITEMS) ×2 IMPLANT
PIPELLE ENDOMETRIAL SUCTION CU (MISCELLANEOUS)
TOWEL OR 17X24 6PK STRL BLUE (TOWEL DISPOSABLE) ×4 IMPLANT
WATER STERILE IRR 500ML POUR (IV SOLUTION) ×2 IMPLANT

## 2018-04-27 NOTE — Anesthesia Procedure Notes (Signed)
Procedure Name: LMA Insertion Performed by: Aunya Lemler D, CRNA Pre-anesthesia Checklist: Patient identified, Emergency Drugs available, Suction available and Patient being monitored Patient Re-evaluated:Patient Re-evaluated prior to induction Oxygen Delivery Method: Circle system utilized Preoxygenation: Pre-oxygenation with 100% oxygen Induction Type: IV induction Ventilation: Mask ventilation without difficulty LMA: LMA inserted LMA Size: 4.0 Number of attempts: 1 Airway Equipment and Method: Bite block Placement Confirmation: positive ETCO2 Tube secured with: Tape Dental Injury: Teeth and Oropharynx as per pre-operative assessment        

## 2018-04-27 NOTE — Transfer of Care (Signed)
Immediate Anesthesia Transfer of Care Note  Patient: Kimberly Welch  Procedure(s) Performed: Procedure(s) (LRB): DILATION  AND CURETTAGE / HYSTEROSCOPY WITH RESECTION (N/A)  Patient Location: PACU  Anesthesia Type: General  Level of Consciousness: awake, oriented, sedated and patient cooperative  Airway & Oxygen Therapy: Patient Spontanous Breathing and Patient connected to face mask oxygen  Post-op Assessment: Report given to PACU RN and Post -op Vital signs reviewed and stable  Post vital signs: Reviewed and stable  Complications: No apparent anesthesia complications Last Vitals:  Vitals Value Taken Time  BP 134/77 04/27/2018  8:33 AM  Temp    Pulse 84 04/27/2018  8:34 AM  Resp 14 04/27/2018  8:34 AM  SpO2 99 % 04/27/2018  8:34 AM  Vitals shown include unvalidated device data.  Last Pain:  Vitals:   04/27/18 0833  TempSrc:   PainSc: (P) Asleep

## 2018-04-27 NOTE — Op Note (Signed)
04/27/2018  8:32 AM  PATIENT:  Kimberly Welch  67 y.o. female  PRE-OPERATIVE DIAGNOSIS:  abnormal ultrasound scan  POST-OPERATIVE DIAGNOSIS:  abnormal ultrasound scan  PROCEDURE:  Procedure(s): DILATION  AND CURETTAGE / HYSTEROSCOPY WITH RESECTION  SURGEON:  Eldred Manges, MD  ASSISTANTS: none  ANESTHESIA:   general  ESTIMATED BLOOD LOSS: 5 mL   BLOOD ADMINISTERED:none  COMPLICATIONS:none  FINDINGS: uterus sounded to 9cm.  At hysteroscopy the endometrium was generally atrophic.  On the left lateral sidewall of the uterus there were 2 polypoid lesions measuring 3 cm and 8 cm respectively.  The larger of the polypoid lesions was adherent to the anterior and posterior surfaces of the endometrial cavity.  FLUID DEFICIT: 75cc  LOCAL MEDICATIONS USED:  LIDOCAINE  and Amount: 10 ml  SPECIMEN:  Source of Specimen:  1.  Endometrial polyp #2 endometrial curettings  DISPOSITION OF SPECIMEN:  PATHOLOGY  COUNTS:  YES  DESCRIPTION OF PROCEDURE:the patient was taken to the operating room after appropriate identification and placed on the operating table. After the attainment of adequate general anesthesia she was placed in the lithotomy position.  Timeout was performed.  The perineum and vagina were prepped with multiple layers of Betadine. The bladder was emptied with a an in and out catheter. The perineum was draped in sterile field. A gray speculum was placed in the vagina. The cervix was grasped with a single-tooth tenaculum. A paracervical block was achieved with a total of 10 cc of 2% Xylocaine and the 5 and 7:00 positions. The uterus was sounded.  The cervix was then dilated to accommodate the diagnostic hysteroscope. The hysteroscope was used to evaluate all quadrants of the uterus.  Tubal ostia were identified and the above-noted findings made and documented. HysteroScopic scissors were used to lyse the anterior and posterior adhesions of the polypoid lesion and to remove the  polypoid lesion at its base.  The procedure was carried out for the smaller polypoid lesion.  Both were removed from the operative field with left hysteroscopic graspers.  Endometrial cavity was then curetted in all quadrants producing minimal tissue. All instruments were then removed from the vagina and the patient was awakened from general anesthesia and taken to the recovery room in satisfactory condition having tolerated the procedure well sponge and instrument counts correct.  PLAN OF CARE: Charge home after postanesthesia care  PATIENT DISPOSITION:  PACU - hemodynamically stable.   Delay start of Pharmacological VTE agent (>24hrs) due to surgical blood loss or risk of bleeding:  yes CD hose were used throughout the procedure.  Patient received ketorolac 15 mg IM and 50 mg IV during the procedure.   Eldred Manges, MD 8:32 AM

## 2018-04-27 NOTE — Discharge Instructions (Signed)
Do not take any nonsteroidal anti inflammatories(ibuprofen, Advil, Motrin, aleve) until after 2:00 pm today.    D & C Home care Instructions:   Personal hygiene:  Used sanitary napkins for vaginal drainage not tampons. Shower or tub bathe the day after your procedure. No douching until bleeding stops. Always wipe from front to back after  Elimination.  Activity: Do not drive or operate any equipment today. The effects of the anesthesia are still present and drowsiness may result. Rest today, not necessarily flat bed rest, just take it easy. You may resume your normal activity in one to 2 days.  Sexual activity: No intercourse for one week or as indicated by your physician  Diet: Eat a light diet as desired this evening. You may resume a regular diet tomorrow.  Return to work: One to 2 days.  General Expectations of your surgery: Vaginal bleeding should be no heavier than a normal period. Spotting may continue up to 10 days. Mild cramps may continue for a couple of days. You may have a regular period in 2-6 weeks.  Unexpected observations call your doctor if these occur: persistent or heavy bleeding. Severe abdominal cramping or pain. Elevation of temperature greater than 100F.  Call for an appointment in one week.      Post Anesthesia Home Care Instructions  Activity: Get plenty of rest for the remainder of the day. A responsible individual must stay with you for 24 hours following the procedure.  For the next 24 hours, DO NOT: -Drive a car -Paediatric nurse -Drink alcoholic beverages -Take any medication unless instructed by your physician -Make any legal decisions or sign important papers.  Meals: Start with liquid foods such as gelatin or soup. Progress to regular foods as tolerated. Avoid greasy, spicy, heavy foods. If nausea and/or vomiting occur, drink only clear liquids until the nausea and/or vomiting subsides. Call your physician if vomiting continues.  Special  Instructions/Symptoms: Your throat may feel dry or sore from the anesthesia or the breathing tube placed in your throat during surgery. If this causes discomfort, gargle with warm salt water. The discomfort should disappear within 24 hours.  If you had a scopolamine patch placed behind your ear for the management of post- operative nausea and/or vomiting:  1. The medication in the patch is effective for 72 hours, after which it should be removed.  Wrap patch in a tissue and discard in the trash. Wash hands thoroughly with soap and water. 2. You may remove the patch earlier than 72 hours if you experience unpleasant side effects which may include dry mouth, dizziness or visual disturbances. 3. Avoid touching the patch. Wash your hands with soap and water after contact with the patch.    Post Anesthesia Home Care Instructions  Activity: Get plenty of rest for the remainder of the day. A responsible individual must stay with you for 24 hours following the procedure.  For the next 24 hours, DO NOT: -Drive a car -Paediatric nurse -Drink alcoholic beverages -Take any medication unless instructed by your physician -Make any legal decisions or sign important papers.  Meals: Start with liquid foods such as gelatin or soup. Progress to regular foods as tolerated. Avoid greasy, spicy, heavy foods. If nausea and/or vomiting occur, drink only clear liquids until the nausea and/or vomiting subsides. Call your physician if vomiting continues.  Special Instructions/Symptoms: Your throat may feel dry or sore from the anesthesia or the breathing tube placed in your throat during surgery. If this causes discomfort, gargle  with warm salt water. The discomfort should disappear within 24 hours.  If you had a scopolamine patch placed behind your ear for the management of post- operative nausea and/or vomiting:  1. The medication in the patch is effective for 72 hours, after which it should be removed.  Wrap  patch in a tissue and discard in the trash. Wash hands thoroughly with soap and water. 2. You may remove the patch earlier than 72 hours if you experience unpleasant side effects which may include dry mouth, dizziness or visual disturbances. 3. Avoid touching the patch. Wash your hands with soap and water after contact with the patch.

## 2018-04-27 NOTE — Anesthesia Postprocedure Evaluation (Signed)
Anesthesia Post Note  Patient: Quintana Renier  Procedure(s) Performed: DILATION  AND CURETTAGE / HYSTEROSCOPY WITH RESECTION (N/A Vagina )     Patient location during evaluation: PACU Anesthesia Type: General Level of consciousness: awake and alert Pain management: pain level controlled Vital Signs Assessment: post-procedure vital signs reviewed and stable Respiratory status: spontaneous breathing, nonlabored ventilation, respiratory function stable and patient connected to nasal cannula oxygen Cardiovascular status: blood pressure returned to baseline and stable Postop Assessment: no apparent nausea or vomiting Anesthetic complications: no    Last Vitals:  Vitals:   04/27/18 0900 04/27/18 0915  BP: 134/71 131/78  Pulse: 83 75  Resp: (!) 21 15  Temp:    SpO2: 99% 100%    Last Pain:  Vitals:   04/27/18 0930  TempSrc:   PainSc: Kersey Xiadani Damman

## 2018-04-28 ENCOUNTER — Encounter (HOSPITAL_BASED_OUTPATIENT_CLINIC_OR_DEPARTMENT_OTHER): Payer: Self-pay | Admitting: Obstetrics and Gynecology

## 2018-05-03 ENCOUNTER — Encounter: Payer: Self-pay | Admitting: Internal Medicine

## 2018-05-24 ENCOUNTER — Encounter: Payer: Federal, State, Local not specified - PPO | Admitting: Internal Medicine

## 2018-06-17 DIAGNOSIS — H9201 Otalgia, right ear: Secondary | ICD-10-CM | POA: Diagnosis not present

## 2018-06-17 DIAGNOSIS — H9209 Otalgia, unspecified ear: Secondary | ICD-10-CM

## 2018-06-17 DIAGNOSIS — H6121 Impacted cerumen, right ear: Secondary | ICD-10-CM | POA: Insufficient documentation

## 2018-06-17 DIAGNOSIS — Z7289 Other problems related to lifestyle: Secondary | ICD-10-CM | POA: Diagnosis not present

## 2018-06-17 DIAGNOSIS — R42 Dizziness and giddiness: Secondary | ICD-10-CM | POA: Diagnosis not present

## 2018-06-17 DIAGNOSIS — H8110 Benign paroxysmal vertigo, unspecified ear: Secondary | ICD-10-CM | POA: Insufficient documentation

## 2018-06-17 DIAGNOSIS — Z972 Presence of dental prosthetic device (complete) (partial): Secondary | ICD-10-CM | POA: Diagnosis not present

## 2018-06-17 HISTORY — DX: Impacted cerumen, right ear: H61.21

## 2018-06-17 HISTORY — DX: Otalgia, unspecified ear: H92.09

## 2018-06-21 DIAGNOSIS — H903 Sensorineural hearing loss, bilateral: Secondary | ICD-10-CM | POA: Diagnosis not present

## 2018-06-21 DIAGNOSIS — R42 Dizziness and giddiness: Secondary | ICD-10-CM | POA: Diagnosis not present

## 2018-06-24 ENCOUNTER — Encounter: Payer: Federal, State, Local not specified - PPO | Admitting: Internal Medicine

## 2018-06-30 DIAGNOSIS — H8113 Benign paroxysmal vertigo, bilateral: Secondary | ICD-10-CM | POA: Diagnosis not present

## 2018-07-07 DIAGNOSIS — H8113 Benign paroxysmal vertigo, bilateral: Secondary | ICD-10-CM | POA: Diagnosis not present

## 2018-08-10 DIAGNOSIS — H40013 Open angle with borderline findings, low risk, bilateral: Secondary | ICD-10-CM | POA: Diagnosis not present

## 2018-08-31 DIAGNOSIS — H25043 Posterior subcapsular polar age-related cataract, bilateral: Secondary | ICD-10-CM | POA: Diagnosis not present

## 2018-08-31 DIAGNOSIS — H25013 Cortical age-related cataract, bilateral: Secondary | ICD-10-CM | POA: Diagnosis not present

## 2018-08-31 DIAGNOSIS — H40013 Open angle with borderline findings, low risk, bilateral: Secondary | ICD-10-CM | POA: Diagnosis not present

## 2018-08-31 DIAGNOSIS — E119 Type 2 diabetes mellitus without complications: Secondary | ICD-10-CM | POA: Diagnosis not present

## 2018-08-31 LAB — HM DIABETES EYE EXAM

## 2018-09-23 ENCOUNTER — Encounter: Payer: Self-pay | Admitting: Internal Medicine

## 2018-09-23 NOTE — Progress Notes (Unsigned)
Abstracted and sent to scan  

## 2018-10-05 ENCOUNTER — Encounter: Payer: Self-pay | Admitting: Internal Medicine

## 2018-10-05 ENCOUNTER — Ambulatory Visit (INDEPENDENT_AMBULATORY_CARE_PROVIDER_SITE_OTHER): Payer: Medicare Other | Admitting: Internal Medicine

## 2018-10-05 DIAGNOSIS — R21 Rash and other nonspecific skin eruption: Secondary | ICD-10-CM

## 2018-10-05 MED ORDER — METHYLPREDNISOLONE ACETATE 40 MG/ML IJ SUSP
40.0000 mg | Freq: Once | INTRAMUSCULAR | Status: AC
  Administered 2018-10-05: 40 mg via INTRAMUSCULAR

## 2018-10-05 MED ORDER — TRIAMCINOLONE ACETONIDE 0.1 % EX CREA: 1.0000 "application " | TOPICAL_CREAM | Freq: Two times a day (BID) | CUTANEOUS | 0 refills | Status: DC

## 2018-10-05 NOTE — Progress Notes (Signed)
   Subjective:   Patient ID: Kimberly Welch, female    DOB: [birth year], 68 y.o.   MRN: [id number]  HPI The patient is a 68 y.o. female coming in for rash symptoms. Started a couple of weeks ago. The rash is all over her body. Main symptoms are: itching, red rash. Denies change in medicine or lotion or shampoo or detergent. Overall it is worse in the last day but had been going away. Has tried nothing. Does use alcohol after showering to help. Is scratching but trying not to do so.   Review of Systems  Constitutional: Negative.   HENT: Negative.   Eyes: Negative.   Respiratory: Negative for cough, chest tightness and shortness of breath.   Cardiovascular: Negative for chest pain, palpitations and leg swelling.  Gastrointestinal: Negative for abdominal distention, abdominal pain, constipation, diarrhea, nausea and vomiting.  Musculoskeletal: Negative.   Skin: Positive for rash.  Neurological: Negative.   Psychiatric/Behavioral: Negative.     Objective:  Physical Exam Constitutional:      Appearance: She is well-developed.  HENT:     Head: Normocephalic and atraumatic.  Neck:     Musculoskeletal: Normal range of motion.  Cardiovascular:     Rate and Rhythm: Normal rate and regular rhythm.  Pulmonary:     Effort: Pulmonary effort is normal. No respiratory distress.     Breath sounds: Normal breath sounds. No wheezing or rales.  Abdominal:     General: Bowel sounds are normal. There is no distension.     Palpations: Abdomen is soft.     Tenderness: There is no abdominal tenderness. There is no rebound.  Skin:    General: Skin is warm and dry.     Findings: Rash present.     Comments: Several dark flat patches of scratching on the back and shoulders and thighs, no appearance of bites and not consistent with bed bugs, no lesions on the palmar or plantar surface  Neurological:     Mental Status: She is alert and oriented to person, place, and time.     Coordination: Coordination  normal.     Vitals:   10/05/18 1536  BP: (!) 150/90  Pulse: 97  Temp: 98.1 F (36.7 C)  TempSrc: Oral  SpO2: 98%  Weight: 154 lb (69.9 kg)  Height: 4\' 11"  (1.499 m)    Assessment & Plan:  Depo-medrol 40 mg IM

## 2018-10-05 NOTE — Patient Instructions (Signed)
We have given you a shot today for the itching and rash.   We have sent in a cream to use twice a day called triamcinolone.    Stop using the alcohol as this can dry out the skin and can make itching worse.

## 2018-10-05 NOTE — Assessment & Plan Note (Signed)
Suspect contact dermatitis. Advised no alcohol, no scratching. Given depo-medrol 40 mg IM and rx for triamcinolone cream.

## 2018-11-04 ENCOUNTER — Other Ambulatory Visit: Payer: Self-pay | Admitting: Internal Medicine

## 2018-11-27 DIAGNOSIS — R7309 Other abnormal glucose: Secondary | ICD-10-CM | POA: Diagnosis not present

## 2018-11-27 DIAGNOSIS — I10 Essential (primary) hypertension: Secondary | ICD-10-CM | POA: Diagnosis not present

## 2018-11-27 DIAGNOSIS — H8110 Benign paroxysmal vertigo, unspecified ear: Secondary | ICD-10-CM | POA: Diagnosis not present

## 2018-12-09 DIAGNOSIS — H40013 Open angle with borderline findings, low risk, bilateral: Secondary | ICD-10-CM | POA: Diagnosis not present

## 2019-03-07 ENCOUNTER — Other Ambulatory Visit: Payer: Self-pay

## 2019-03-14 ENCOUNTER — Telehealth: Payer: Self-pay | Admitting: Internal Medicine

## 2019-03-14 NOTE — Telephone Encounter (Signed)
Called patient to schedule AWV; no answer. Will try to contact patient at a later time. SF

## 2019-03-28 ENCOUNTER — Encounter: Payer: Self-pay | Admitting: Internal Medicine

## 2019-03-28 ENCOUNTER — Other Ambulatory Visit: Payer: Self-pay

## 2019-03-28 ENCOUNTER — Other Ambulatory Visit (INDEPENDENT_AMBULATORY_CARE_PROVIDER_SITE_OTHER): Payer: Medicare Other

## 2019-03-28 ENCOUNTER — Ambulatory Visit (INDEPENDENT_AMBULATORY_CARE_PROVIDER_SITE_OTHER): Payer: Medicare Other | Admitting: Internal Medicine

## 2019-03-28 VITALS — BP 110/88 | HR 88 | Temp 98.1°F | Ht 59.0 in | Wt 154.0 lb

## 2019-03-28 DIAGNOSIS — E785 Hyperlipidemia, unspecified: Secondary | ICD-10-CM

## 2019-03-28 DIAGNOSIS — E669 Obesity, unspecified: Secondary | ICD-10-CM | POA: Diagnosis not present

## 2019-03-28 DIAGNOSIS — E1169 Type 2 diabetes mellitus with other specified complication: Secondary | ICD-10-CM

## 2019-03-28 DIAGNOSIS — Z0000 Encounter for general adult medical examination without abnormal findings: Secondary | ICD-10-CM

## 2019-03-28 DIAGNOSIS — Z23 Encounter for immunization: Secondary | ICD-10-CM | POA: Diagnosis not present

## 2019-03-28 DIAGNOSIS — I10 Essential (primary) hypertension: Secondary | ICD-10-CM

## 2019-03-28 LAB — CBC
HCT: 35.9 % — ABNORMAL LOW (ref 36.0–46.0)
Hemoglobin: 11.7 g/dL — ABNORMAL LOW (ref 12.0–15.0)
MCHC: 32.7 g/dL (ref 30.0–36.0)
MCV: 85.1 fl (ref 78.0–100.0)
Platelets: 299 10*3/uL (ref 150.0–400.0)
RBC: 4.22 Mil/uL (ref 3.87–5.11)
RDW: 13.6 % (ref 11.5–15.5)
WBC: 7.4 10*3/uL (ref 4.0–10.5)

## 2019-03-28 LAB — COMPREHENSIVE METABOLIC PANEL
ALT: 12 U/L (ref 0–35)
AST: 19 U/L (ref 0–37)
Albumin: 4.8 g/dL (ref 3.5–5.2)
Alkaline Phosphatase: 93 U/L (ref 39–117)
BUN: 13 mg/dL (ref 6–23)
CO2: 30 mEq/L (ref 19–32)
Calcium: 9.6 mg/dL (ref 8.4–10.5)
Chloride: 96 mEq/L (ref 96–112)
Creatinine, Ser: 0.87 mg/dL (ref 0.40–1.20)
GFR: 78.21 mL/min (ref >60.00)
Glucose, Bld: 109 mg/dL — ABNORMAL HIGH (ref 70–99)
Potassium: 3.8 mEq/L (ref 3.5–5.1)
Sodium: 135 mEq/L (ref 135–145)
Total Bilirubin: 0.4 mg/dL (ref 0.2–1.2)
Total Protein: 7.8 g/dL (ref 6.0–8.3)

## 2019-03-28 LAB — MICROALBUMIN / CREATININE URINE RATIO
Creatinine,U: 35.3 mg/dL
Microalb Creat Ratio: 2 mg/g (ref 0.0–30.0)
Microalb, Ur: <0.7 mg/dL (ref 0.0–1.9)

## 2019-03-28 LAB — LIPID PANEL
Cholesterol: 222 mg/dL — ABNORMAL HIGH (ref 0–200)
HDL: 40.4 mg/dL (ref >39.00)
LDL Cholesterol: 151 mg/dL — ABNORMAL HIGH (ref 0–99)
NonHDL: 181.12
Total CHOL/HDL Ratio: 5
Triglycerides: 151 mg/dL — ABNORMAL HIGH (ref 0.0–149.0)
VLDL: 30.2 mg/dL (ref 0.0–40.0)

## 2019-03-28 LAB — HEMOGLOBIN A1C: Hgb A1c MFr Bld: 6.5 % (ref 4.6–6.5)

## 2019-03-28 NOTE — Assessment & Plan Note (Signed)
BP at goal today. Taking losartan/hctz and room to increase if needed. Checking CMP and adjust as needed.

## 2019-03-28 NOTE — Progress Notes (Signed)
Subjective:   Patient ID: Kimberly Welch, female    DOB: [birth year], 68 y.o.   MRN: [id number]  HPI Here for medicare wellness, no new complaints. Please see A/P for status and treatment of chronic medical problems.   HPI #2: Here for follow up of diabetes (taking metformin 1/2 pill BID, denies side effects, is having some sweating overnight and is not sure if this is related to sugars, denies new numbness or tingling in hands or feet) and blood pressure (taking losartan/hctz and denies side effects, denies chest pains or headaches, had 1 episode back in April with high BP and headache but this passed, did seek care at urgent care, denies missing doses of meds) and cholesterol (taking pravastatin, denies side effects, denies stroke or chest pain symptoms).   Diet: DM since diabetic Physical activity: sedentary Depression/mood screen: negative Hearing: intact to whispered voice Visual acuity: grossly normal, performs annual eye exam  ADLs: capable Fall risk: none Home safety: good Cognitive evaluation: intact to orientation, naming, recall and repetition EOL planning: adv directives discussed    Office Visit from 03/28/2019 in [facility]  PHQ-2 Total Score  1      I have personally reviewed and have noted 1. The patient's medical and social history - reviewed today no changes 2. Their use of alcohol, tobacco or illicit drugs 3. Their current medications and supplements 4. The patient's functional ability including ADL's, fall risks, home safety risks and hearing or visual impairment. 5. Diet and physical activities 6. Evidence for depression or mood disorders 7. Care team reviewed and updated  Patient Care Team: Hoyt Koch, MD as PCP - General (Internal Medicine) Leo Grosser Seymour Bars, MD as Consulting Physician (Obstetrics and Gynecology) Gatha Mayer, MD as Consulting Physician (Gastroenterology) Past Medical History:  Diagnosis Date  .  Cholelithiasis    symptomatic  . GERD (gastroesophageal reflux disease)   . Hyperlipidemia   . Hypertension   . Type 2 diabetes mellitus ([place])   . Wears partial dentures    UPPER AND LOWER   Past Surgical History:  Procedure Laterality Date  . CHOLECYSTECTOMY N/A 11/28/2015   Procedure: LAPAROSCOPIC CHOLECYSTECTOMY;  Surgeon: Mickeal Skinner, MD;  Location: [facility];  Service: General;  Laterality: N/A;  . D & C HYSTEROSCOPY/  RESECTION ENDOMETRIAL POLYP'S  01-11-2009  . DILATATION & CURRETTAGE/HYSTEROSCOPY WITH RESECTOCOPE N/A 04/27/2018   Procedure: DILATION  AND CURETTAGE / HYSTEROSCOPY WITH RESECTION;  Surgeon: Eldred Manges, MD;  Location: [facility];  Service: Gynecology;  Laterality: N/A;   History reviewed. No pertinent family history.  Review of Systems  Constitutional: Negative.   HENT: Negative.   Eyes: Negative.   Respiratory: Negative for cough, chest tightness and shortness of breath.   Cardiovascular: Negative for chest pain, palpitations and leg swelling.  Gastrointestinal: Negative for abdominal distention, abdominal pain, constipation, diarrhea, nausea and vomiting.  Musculoskeletal: Negative.   Skin: Negative.   Neurological: Negative.   Psychiatric/Behavioral: Negative.     Objective:  Physical Exam Constitutional:      Appearance: She is well-developed. She is obese.  HENT:     Head: Normocephalic and atraumatic.  Neck:     Musculoskeletal: Normal range of motion.  Cardiovascular:     Rate and Rhythm: Normal rate and regular rhythm.  Pulmonary:     Effort: Pulmonary effort is normal. No respiratory distress.     Breath sounds: Normal breath sounds. No wheezing or rales.  Abdominal:     General: Bowel sounds are normal. There is no distension.     Palpations: Abdomen is soft.     Tenderness: There is no abdominal tenderness. There is no rebound.  Skin:    General: Skin is warm and dry.  Neurological:      Mental Status: She is alert and oriented to person, place, and time.     Coordination: Coordination normal.    Vitals:   03/28/19 1041  BP: 110/88  Pulse: 88  Temp: 98.1 F (36.7 C)  TempSrc: Oral  SpO2: 98%  Weight: 154 lb (69.9 kg)  Height: 4\' 11"  (1.499 m)   Assessment & Plan:  Flu shot given at visit

## 2019-03-28 NOTE — Patient Instructions (Signed)
Health Maintenance, Female Adopting a healthy lifestyle and getting preventive care are important in promoting health and wellness. Ask your health care provider about:  The right schedule for you to have regular tests and exams.  Things you can do on your own to prevent diseases and keep yourself healthy. What should I know about diet, weight, and exercise? Eat a healthy diet   Eat a diet that includes plenty of vegetables, fruits, low-fat dairy products, and lean protein.  Do not eat a lot of foods that are high in solid fats, added sugars, or sodium. Maintain a healthy weight Body mass index (BMI) is used to identify weight problems. It estimates body fat based on height and weight. Your health care provider can help determine your BMI and help you achieve or maintain a healthy weight. Get regular exercise Get regular exercise. This is one of the most important things you can do for your health. Most adults should:  Exercise for at least 150 minutes each week. The exercise should increase your heart rate and make you sweat (moderate-intensity exercise).  Do strengthening exercises at least twice a week. This is in addition to the moderate-intensity exercise.  Spend less time sitting. Even light physical activity can be beneficial. Watch cholesterol and blood lipids Have your blood tested for lipids and cholesterol at 68 years of age, then have this test every 5 years. Have your cholesterol levels checked more often if:  Your lipid or cholesterol levels are high.  You are older than 68 years of age.  You are at high risk for heart disease. What should I know about cancer screening? Depending on your health history and family history, you may need to have cancer screening at various ages. This may include screening for:  Breast cancer.  Cervical cancer.  Colorectal cancer.  Skin cancer.  Lung cancer. What should I know about heart disease, diabetes, and high blood  pressure? Blood pressure and heart disease  High blood pressure causes heart disease and increases the risk of stroke. This is more likely to develop in people who have high blood pressure readings, are of African descent, or are overweight.  Have your blood pressure checked: ? Every 3-5 years if you are 18-39 years of age. ? Every year if you are 40 years old or older. Diabetes Have regular diabetes screenings. This checks your fasting blood sugar level. Have the screening done:  Once every three years after age 40 if you are at a normal weight and have a low risk for diabetes.  More often and at a younger age if you are overweight or have a high risk for diabetes. What should I know about preventing infection? Hepatitis B If you have a higher risk for hepatitis B, you should be screened for this virus. Talk with your health care provider to find out if you are at risk for hepatitis B infection. Hepatitis C Testing is recommended for:  Everyone born from 1945 through 1965.  Anyone with known risk factors for hepatitis C. Sexually transmitted infections (STIs)  Get screened for STIs, including gonorrhea and chlamydia, if: ? You are sexually active and are younger than 68 years of age. ? You are older than 68 years of age and your health care provider tells you that you are at risk for this type of infection. ? Your sexual activity has changed since you were last screened, and you are at increased risk for chlamydia or gonorrhea. Ask your health care provider if   you are at risk.  Ask your health care provider about whether you are at high risk for HIV. Your health care provider may recommend a prescription medicine to help prevent HIV infection. If you choose to take medicine to prevent HIV, you should first get tested for HIV. You should then be tested every 3 months for as long as you are taking the medicine. Pregnancy  If you are about to stop having your period (premenopausal) and  you may become pregnant, seek counseling before you get pregnant.  Take 400 to 800 micrograms (mcg) of folic acid every day if you become pregnant.  Ask for birth control (contraception) if you want to prevent pregnancy. Osteoporosis and menopause Osteoporosis is a disease in which the bones lose minerals and strength with aging. This can result in bone fractures. If you are 65 years old or older, or if you are at risk for osteoporosis and fractures, ask your health care provider if you should:  Be screened for bone loss.  Take a calcium or vitamin D supplement to lower your risk of fractures.  Be given hormone replacement therapy (HRT) to treat symptoms of menopause. Follow these instructions at home: Lifestyle  Do not use any products that contain nicotine or tobacco, such as cigarettes, e-cigarettes, and chewing tobacco. If you need help quitting, ask your health care provider.  Do not use street drugs.  Do not share needles.  Ask your health care provider for help if you need support or information about quitting drugs. Alcohol use  Do not drink alcohol if: ? Your health care provider tells you not to drink. ? You are pregnant, may be pregnant, or are planning to become pregnant.  If you drink alcohol: ? Limit how much you use to 0-1 drink a day. ? Limit intake if you are breastfeeding.  Be aware of how much alcohol is in your drink. In the [place] one drink equals one 12 oz bottle of beer (355 mL), one 5 oz glass of wine (148 mL), or one 1 oz glass of hard liquor (44 mL). General instructions  Schedule regular health, dental, and eye exams.  Stay current with your vaccines.  Tell your health care provider if: ? You often feel depressed. ? You have ever been abused or do not feel safe at home. Summary  Adopting a healthy lifestyle and getting preventive care are important in promoting health and wellness.  Follow your health care provider's instructions about healthy  diet, exercising, and getting tested or screened for diseases.  Follow your health care provider's instructions on monitoring your cholesterol and blood pressure. This information is not intended to replace advice given to you by your health care provider. Make sure you discuss any questions you have with your health care provider. Document Released: 02/03/2011 Document Revised: 07/14/2018 Document Reviewed: 07/14/2018 Elsevier Patient Education  2020 [employer]  

## 2019-03-28 NOTE — Assessment & Plan Note (Signed)
Checking lipid panel and adjust pravastatin 20 mg daily if needed for LDL <100.

## 2019-03-28 NOTE — Assessment & Plan Note (Signed)
Foot exam done, eye exam up to date. Taking metformin and ARB and statin. Checking microalbumin to creatinine ratio and HgA1c and lipid panel. Adjust as needed.

## 2019-03-28 NOTE — Assessment & Plan Note (Signed)
Flu shot given. Pneumonia declines today. Shingrix declines today. Tetanus due 2029. Colonoscopy declines. Mammogram up to date due 2021, pap smear with gyn and dexa declines. Counseled about sun safety and mole surveillance. Counseled about the dangers of distracted driving. Given 10 year screening recommendations.

## 2019-03-31 ENCOUNTER — Other Ambulatory Visit: Payer: Self-pay

## 2019-03-31 MED ORDER — PRAVASTATIN SODIUM 20 MG PO TABS: 20.0000 mg | ORAL_TABLET | Freq: Every day | ORAL | 3 refills | Status: DC

## 2019-05-04 ENCOUNTER — Other Ambulatory Visit: Payer: Self-pay | Admitting: Internal Medicine

## 2019-05-09 ENCOUNTER — Ambulatory Visit: Payer: Medicare Other

## 2019-05-10 DIAGNOSIS — Z1231 Encounter for screening mammogram for malignant neoplasm of breast: Secondary | ICD-10-CM | POA: Diagnosis not present

## 2019-05-16 ENCOUNTER — Ambulatory Visit: Payer: Medicare Other

## 2019-06-20 ENCOUNTER — Other Ambulatory Visit: Payer: Self-pay

## 2019-06-20 DIAGNOSIS — Z20828 Contact with and (suspected) exposure to other viral communicable diseases: Secondary | ICD-10-CM | POA: Diagnosis not present

## 2019-06-20 DIAGNOSIS — Z20822 Contact with and (suspected) exposure to covid-19: Secondary | ICD-10-CM

## 2019-06-22 LAB — NOVEL CORONAVIRUS, NAA: SARS-CoV-2, NAA: NOT DETECTED

## 2019-10-28 ENCOUNTER — Other Ambulatory Visit: Payer: Self-pay | Admitting: Internal Medicine

## 2019-10-30 DIAGNOSIS — Z20828 Contact with and (suspected) exposure to other viral communicable diseases: Secondary | ICD-10-CM | POA: Diagnosis not present

## 2019-10-30 DIAGNOSIS — Z03818 Encounter for observation for suspected exposure to other biological agents ruled out: Secondary | ICD-10-CM | POA: Diagnosis not present

## 2020-04-12 DIAGNOSIS — Z20822 Contact with and (suspected) exposure to covid-19: Secondary | ICD-10-CM | POA: Diagnosis not present

## 2020-04-12 DIAGNOSIS — Z03818 Encounter for observation for suspected exposure to other biological agents ruled out: Secondary | ICD-10-CM | POA: Diagnosis not present

## 2020-04-20 ENCOUNTER — Encounter: Payer: Self-pay | Admitting: Family

## 2020-04-20 ENCOUNTER — Other Ambulatory Visit: Payer: Self-pay

## 2020-04-20 ENCOUNTER — Ambulatory Visit (INDEPENDENT_AMBULATORY_CARE_PROVIDER_SITE_OTHER): Payer: Medicare Other | Admitting: Family

## 2020-04-20 VITALS — BP 146/80 | HR 88 | Temp 98.3°F | Ht 59.0 in | Wt 152.0 lb

## 2020-04-20 DIAGNOSIS — R42 Dizziness and giddiness: Secondary | ICD-10-CM

## 2020-04-20 MED ORDER — PREDNISONE 20 MG PO TABS: 20.0000 mg | ORAL_TABLET | Freq: Every day | ORAL | 0 refills | Status: DC

## 2020-04-20 MED ORDER — MECLIZINE HCL 25 MG PO TABS: 25.0000 mg | ORAL_TABLET | Freq: Three times a day (TID) | ORAL | 0 refills | Status: DC | PRN

## 2020-04-20 MED ORDER — ONDANSETRON HCL 8 MG PO TABS: 8.0000 mg | ORAL_TABLET | Freq: Three times a day (TID) | ORAL | 0 refills | Status: DC | PRN

## 2020-04-20 MED ORDER — AMOXICILLIN-POT CLAVULANATE 875-125 MG PO TABS: 1.0000 | ORAL_TABLET | Freq: Two times a day (BID) | ORAL | 0 refills | Status: AC

## 2020-04-20 NOTE — Patient Instructions (Signed)

## 2020-04-20 NOTE — Progress Notes (Signed)
Kimberly Welch is a 69 y.o. female with the following history as recorded in EpicCare:  Patient Active Problem List   Diagnosis Date Noted  . Fibroids 04/27/2018  . Atypical squamous cells cannot exclude high grade squamous intraepithelial lesion on cytologic smear of cervix (ASC-H) 04/27/2018  . Right hip pain 01/06/2017  . Hemorrhoid 05/01/2016  . Lightheadedness 05/01/2016  . Elevated LFTs 10/09/2015  . Diabetes mellitus type 2 in obese ([place]) 07/05/2014  . Palpitations 07/05/2014  . Hyperlipidemia associated with type 2 diabetes mellitus ([place]) 07/05/2014  . Essential hypertension 07/05/2014  . Routine general medical examination at a health care facility 07/05/2014    Current Outpatient Medications  Medication Sig Dispense Refill  . ibuprofen (ADVIL,MOTRIN) 600 MG tablet Take 1 tablet every 6 hours for 3 days and then 1 tablet every 6 hours as needed for pain 60 tablet 0  . losartan-hydrochlorothiazide (HYZAAR) 50-12.5 MG tablet TAKE 1 TABLET BY MOUTH EVERY DAY 90 tablet 3  . metFORMIN (GLUCOPHAGE) 500 MG tablet TAKE 1 TABLET BY MOUTH EVERY DAY WITH BREAKFAST 90 tablet 1  . pravastatin (PRAVACHOL) 20 MG tablet Take 1 tablet (20 mg total) by mouth daily. 90 tablet 3  . amoxicillin-clavulanate (AUGMENTIN) 875-125 MG tablet Take 1 tablet by mouth 2 (two) times daily for 10 days. 20 tablet 0  . meclizine (ANTIVERT) 25 MG tablet Take 1 tablet (25 mg total) by mouth 3 (three) times daily as needed for dizziness. 30 tablet 0  . ondansetron (ZOFRAN) 8 MG tablet Take 1 tablet (8 mg total) by mouth every 8 (eight) hours as needed for nausea or vomiting. 20 tablet 0  . predniSONE (DELTASONE) 20 MG tablet Take 1 tablet (20 mg total) by mouth daily with breakfast. 5 tablet 0   No current facility-administered medications for this visit.    Allergies: Sulfa antibiotics  Past Medical History:  Diagnosis Date  . Cholelithiasis    symptomatic  . GERD (gastroesophageal reflux disease)   .  Hyperlipidemia   . Hypertension   . Type 2 diabetes mellitus ([place])   . Wears partial dentures    UPPER AND LOWER    Past Surgical History:  Procedure Laterality Date  . CHOLECYSTECTOMY N/A 11/28/2015   Procedure: LAPAROSCOPIC CHOLECYSTECTOMY;  Surgeon: Mickeal Skinner, MD;  Location: [facility];  Service: General;  Laterality: N/A;  . D & C HYSTEROSCOPY/  RESECTION ENDOMETRIAL POLYP'S  01-11-2009  . DILATATION & CURRETTAGE/HYSTEROSCOPY WITH RESECTOCOPE N/A 04/27/2018   Procedure: DILATION  AND CURETTAGE / HYSTEROSCOPY WITH RESECTION;  Surgeon: Eldred Manges, MD;  Location: [facility];  Service: Gynecology;  Laterality: N/A;    No family history on file.  Social History   Tobacco Use  . Smoking status: Never Smoker  . Smokeless tobacco: Never Used  Substance Use Topics  . Alcohol use: Yes    Alcohol/week: 0.0 standard drinks    Comment: RARE    Subjective:  History of vertigo; has had to do vestibular therapy in the past; is planning to schedule a follow-up there; Feels like her symptoms have been flared up again for the past 2-3 weeks; + room spinning; no headache; + congestion;   Objective:  Vitals:   04/20/20 1547  BP: (!) 146/80  Pulse: 88  Temp: 98.3 F (36.8 C)  TempSrc: Oral  SpO2: 99%  Weight: 152 lb (68.9 kg)  Height: 4\' 11"  (1.499 m)    General: Well developed, well nourished, in no acute distress  Head: Normocephalic and atraumatic  Eyes: Sclera and conjunctiva clear; pupils round and reactive to light; extraocular movements intact  Ears: External normal; canals clear; tympanic membranes normal  Oropharynx: Pink, supple. No suspicious lesions  Neck: Supple without thyromegaly, adenopathy  Lungs: Respirations unlabored; clear to auscultation bilaterally without wheeze, rales, rhonchi  CVS exam: normal rate and regular rhythm.  Neurologic: Alert and oriented; speech intact; face symmetrical; moves all extremities well;  CNII-XII intact without focal deficit   Assessment:  1. Vertigo     Plan:  Patient wants to see ENT before any type of imaging is scheduled; in the interim, will treat with Augmentin, Prednisone and Antivert; can use Zofran as needed for nausea; She will follow-up if ENT cannot see her in the next week or so in order get MRI scheduled if needed;  This visit occurred during the SARS-CoV-2 public health emergency.  Safety protocols were in place, including screening questions prior to the visit, additional usage of staff PPE, and extensive cleaning of exam room while observing appropriate contact time as indicated for disinfecting solutions.     No follow-ups on file.  No orders of the defined types were placed in this encounter.   Requested Prescriptions   Signed Prescriptions Disp Refills  . amoxicillin-clavulanate (AUGMENTIN) 875-125 MG tablet 20 tablet 0    Sig: Take 1 tablet by mouth 2 (two) times daily for 10 days.  . predniSONE (DELTASONE) 20 MG tablet 5 tablet 0    Sig: Take 1 tablet (20 mg total) by mouth daily with breakfast.  . meclizine (ANTIVERT) 25 MG tablet 30 tablet 0    Sig: Take 1 tablet (25 mg total) by mouth 3 (three) times daily as needed for dizziness.  . ondansetron (ZOFRAN) 8 MG tablet 20 tablet 0    Sig: Take 1 tablet (8 mg total) by mouth every 8 (eight) hours as needed for nausea or vomiting.

## 2020-04-27 ENCOUNTER — Emergency Department (HOSPITAL_COMMUNITY): Payer: No Typology Code available for payment source

## 2020-04-27 ENCOUNTER — Other Ambulatory Visit: Payer: Self-pay

## 2020-04-27 ENCOUNTER — Emergency Department (HOSPITAL_COMMUNITY)
Admission: EM | Admit: 2020-04-27 | Discharge: 2020-04-27 | Disposition: A | Discharge status: Home / Self Care | Payer: No Typology Code available for payment source | Attending: Emergency Medicine | Admitting: Emergency Medicine

## 2020-04-27 ENCOUNTER — Encounter (HOSPITAL_COMMUNITY): Payer: Self-pay | Admitting: Emergency Medicine

## 2020-04-27 ENCOUNTER — Other Ambulatory Visit: Payer: Self-pay | Admitting: Internal Medicine

## 2020-04-27 DIAGNOSIS — Z7984 Long term (current) use of oral hypoglycemic drugs: Secondary | ICD-10-CM | POA: Diagnosis not present

## 2020-04-27 DIAGNOSIS — Y9389 Activity, other specified: Secondary | ICD-10-CM | POA: Insufficient documentation

## 2020-04-27 DIAGNOSIS — Y9289 Other specified places as the place of occurrence of the external cause: Secondary | ICD-10-CM | POA: Diagnosis not present

## 2020-04-27 DIAGNOSIS — E1169 Type 2 diabetes mellitus with other specified complication: Secondary | ICD-10-CM | POA: Diagnosis not present

## 2020-04-27 DIAGNOSIS — R0781 Pleurodynia: Secondary | ICD-10-CM | POA: Diagnosis not present

## 2020-04-27 DIAGNOSIS — R0789 Other chest pain: Secondary | ICD-10-CM | POA: Diagnosis not present

## 2020-04-27 DIAGNOSIS — S20212A Contusion of left front wall of thorax, initial encounter: Secondary | ICD-10-CM | POA: Insufficient documentation

## 2020-04-27 DIAGNOSIS — I10 Essential (primary) hypertension: Secondary | ICD-10-CM | POA: Diagnosis not present

## 2020-04-27 DIAGNOSIS — R079 Chest pain, unspecified: Secondary | ICD-10-CM | POA: Diagnosis not present

## 2020-04-27 MED ORDER — ACETAMINOPHEN 325 MG PO TABS
650.0000 mg | ORAL_TABLET | Freq: Once | ORAL | Status: AC
  Administered 2020-04-27: 650 mg via ORAL
  Filled 2020-04-27: qty 2

## 2020-04-27 NOTE — Discharge Instructions (Addendum)
It was our pleasure to provide your ER care today - we hope that you feel better.  Your xrays look good.   Take acetaminophen or ibuprofen as need for pain.  Follow up with primary care doctor in 1-2 weeks if symptoms fail to improve/resolve.  Return to ER if worse, new symptoms, new or severe pain, trouble breathing, or other concern.
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
## 2020-05-18 ENCOUNTER — Ambulatory Visit (HOSPITAL_COMMUNITY)
Admission: EM | Admit: 2020-05-18 | Discharge: 2020-05-18 | Disposition: A | Discharge status: Home / Self Care | Payer: Medicare Other | Attending: Family Medicine | Admitting: Family Medicine

## 2020-05-18 ENCOUNTER — Encounter (HOSPITAL_COMMUNITY): Payer: Self-pay

## 2020-05-18 ENCOUNTER — Other Ambulatory Visit: Payer: Self-pay

## 2020-05-18 DIAGNOSIS — R0789 Other chest pain: Secondary | ICD-10-CM

## 2020-05-18 DIAGNOSIS — R0602 Shortness of breath: Secondary | ICD-10-CM

## 2020-05-18 MED ORDER — MELOXICAM 7.5 MG PO TABS: 7.5000 mg | ORAL_TABLET | Freq: Every day | ORAL | 0 refills | Status: DC
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
## 2020-05-18 NOTE — Discharge Instructions (Addendum)
Take meloxicam once a day until your ribs feel better, 1 to 2 weeks Do not take ibuprofen or Aleve while on meloxicam You may take Tylenol if needed for pain Try warm compresses to the painful rib areas See your doctor if not improving in another couple of weeks Return immediately if you have fever, increasing cough or shortness of breath
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
## 2020-05-21 ENCOUNTER — Telehealth: Payer: Self-pay | Admitting: Internal Medicine
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
## 2020-05-21 NOTE — Telephone Encounter (Signed)
Noted  

## 2020-06-22 DIAGNOSIS — Z23 Encounter for immunization: Secondary | ICD-10-CM | POA: Diagnosis not present

## 2020-07-22 ENCOUNTER — Other Ambulatory Visit: Payer: Self-pay | Admitting: Internal Medicine

## 2020-08-17 ENCOUNTER — Other Ambulatory Visit: Payer: Self-pay | Admitting: Internal Medicine

## 2020-08-22 DIAGNOSIS — Z20822 Contact with and (suspected) exposure to covid-19: Secondary | ICD-10-CM | POA: Diagnosis not present

## 2020-09-12 ENCOUNTER — Other Ambulatory Visit: Payer: Self-pay | Admitting: Internal Medicine

## 2020-09-14 ENCOUNTER — Other Ambulatory Visit: Payer: Self-pay | Admitting: Internal Medicine

## 2020-09-30 ENCOUNTER — Other Ambulatory Visit: Payer: Self-pay | Admitting: Internal Medicine

## 2020-10-03 ENCOUNTER — Encounter: Payer: Self-pay | Admitting: Internal Medicine

## 2020-10-03 ENCOUNTER — Ambulatory Visit (INDEPENDENT_AMBULATORY_CARE_PROVIDER_SITE_OTHER): Payer: Medicare Other | Admitting: Internal Medicine

## 2020-10-03 ENCOUNTER — Other Ambulatory Visit: Payer: Self-pay

## 2020-10-03 VITALS — BP 138/76 | HR 85 | Temp 98.2°F | Resp 18 | Ht 59.0 in | Wt 156.0 lb

## 2020-10-03 DIAGNOSIS — Z23 Encounter for immunization: Secondary | ICD-10-CM

## 2020-10-03 DIAGNOSIS — Z0000 Encounter for general adult medical examination without abnormal findings: Secondary | ICD-10-CM | POA: Diagnosis not present

## 2020-10-03 DIAGNOSIS — I10 Essential (primary) hypertension: Secondary | ICD-10-CM

## 2020-10-03 DIAGNOSIS — E1169 Type 2 diabetes mellitus with other specified complication: Secondary | ICD-10-CM | POA: Diagnosis not present

## 2020-10-03 DIAGNOSIS — E669 Obesity, unspecified: Secondary | ICD-10-CM | POA: Diagnosis not present

## 2020-10-03 DIAGNOSIS — R7989 Other specified abnormal findings of blood chemistry: Secondary | ICD-10-CM

## 2020-10-03 DIAGNOSIS — E785 Hyperlipidemia, unspecified: Secondary | ICD-10-CM | POA: Diagnosis not present

## 2020-10-03 LAB — HEMOGLOBIN A1C: Hgb A1c MFr Bld: 6.6 % — ABNORMAL HIGH (ref 4.6–6.5)

## 2020-10-03 LAB — COMPREHENSIVE METABOLIC PANEL
ALT: 14 U/L (ref 0–35)
AST: 20 U/L (ref 0–37)
Albumin: 4.4 g/dL (ref 3.5–5.2)
Alkaline Phosphatase: 84 U/L (ref 39–117)
BUN: 13 mg/dL (ref 6–23)
CO2: 29 mEq/L (ref 19–32)
Calcium: 10 mg/dL (ref 8.4–10.5)
Chloride: 100 mEq/L (ref 96–112)
Creatinine, Ser: 0.9 mg/dL (ref 0.40–1.20)
GFR: 64.95 mL/min (ref >60.00)
Glucose, Bld: 95 mg/dL (ref 70–99)
Potassium: 3.9 mEq/L (ref 3.5–5.1)
Sodium: 137 mEq/L (ref 135–145)
Total Bilirubin: 0.4 mg/dL (ref 0.2–1.2)
Total Protein: 7.5 g/dL (ref 6.0–8.3)

## 2020-10-03 LAB — LIPID PANEL
Cholesterol: 204 mg/dL — ABNORMAL HIGH (ref 0–200)
HDL: 42.5 mg/dL (ref >39.00)
LDL Cholesterol: 128 mg/dL — ABNORMAL HIGH (ref 0–99)
NonHDL: 161.42
Total CHOL/HDL Ratio: 5
Triglycerides: 167 mg/dL — ABNORMAL HIGH (ref 0.0–149.0)
VLDL: 33.4 mg/dL (ref 0.0–40.0)

## 2020-10-03 LAB — CBC
HCT: 36.4 % (ref 36.0–46.0)
Hemoglobin: 11.9 g/dL — ABNORMAL LOW (ref 12.0–15.0)
MCHC: 32.8 g/dL (ref 30.0–36.0)
MCV: 85.1 fl (ref 78.0–100.0)
Platelets: 301 10*3/uL (ref 150.0–400.0)
RBC: 4.27 Mil/uL (ref 3.87–5.11)
RDW: 13.4 % (ref 11.5–15.5)
WBC: 7.5 10*3/uL (ref 4.0–10.5)

## 2020-10-03 LAB — MICROALBUMIN / CREATININE URINE RATIO
Creatinine,U: 43.7 mg/dL
Microalb Creat Ratio: 1.6 mg/g (ref 0.0–30.0)
Microalb, Ur: <0.7 mg/dL (ref 0.0–1.9)

## 2020-10-03 MED ORDER — PRAVASTATIN SODIUM 20 MG PO TABS
20.0000 mg | ORAL_TABLET | Freq: Every day | ORAL | 3 refills | Status: DC
Start: 2020-10-03 — End: 2021-09-11

## 2020-10-03 NOTE — Progress Notes (Signed)
Subjective:   Patient ID: Kimberly Welch, female    DOB: 1951/02/16, 70 y.o.   MRN: [id number]  HPI Here for medicare wellness, no new complaints. Please see A/P for status and treatment of chronic medical problems.   HPI #2: Here for follow up cholesterol (taking pravastatin 20 mg daily, denies side effects, denies chest pains or stroke symptoms) and diabetes (no labs since 2020, taking metformin, on ARB and statin, denies new numbness but having right sided low back pain with sciatica with prolonged standing) and blood pressure (taking losartan/hctz and denies missing, denies side effects, denies chest pains or headaches).   Diet: diabetic Physical activity: sedentary Depression/mood screen: negative Hearing: intact to whispered voice Visual acuity: grossly normal, overdue for annual eye exam  ADLs: capable Fall risk: none Home safety: good Cognitive evaluation: intact to orientation, naming, recall and repetition EOL planning: adv directives discussed, not in place  [employer] Visit from 04/20/2020 in [place] at [facility] Total Score 1      I have personally reviewed and have noted 1. The patient's medical and social history - reviewed today no changes 2. Their use of alcohol, tobacco or illicit drugs 3. Their current medications and supplements 4. The patient's functional ability including ADL's, fall risks, home safety risks and hearing or visual impairment. 5. Diet and physical activities 6. Evidence for depression or mood disorders 7. Care team reviewed and updated 8.  The patient is on an opioid pain medication and this was reviewed with patient and non-opioid pain medication options were reviewed and offered to patient, their pain treatment plan and severity was discussed with them. We have considered referrals as appropriate for patient. Opioid risk factors were also considered and reviewed.   Patient Care Team: Hoyt Koch, MD as  PCP - General (Internal Medicine) Leo Grosser Seymour Bars, MD (Inactive) as Consulting Physician (Obstetrics and Gynecology) Gatha Mayer, MD as Consulting Physician (Gastroenterology) Past Medical History:  Diagnosis Date  . Cholelithiasis    symptomatic  . GERD (gastroesophageal reflux disease)   . Hyperlipidemia   . Hypertension   . Type 2 diabetes mellitus ([place])   . Wears partial dentures    UPPER AND LOWER   Past Surgical History:  Procedure Laterality Date  . CHOLECYSTECTOMY N/A 11/28/2015   Procedure: LAPAROSCOPIC CHOLECYSTECTOMY;  Surgeon: Mickeal Skinner, MD;  Location: [facility];  Service: General;  Laterality: N/A;  . D & C HYSTEROSCOPY/  RESECTION ENDOMETRIAL POLYP'S  01-11-2009  . DILATATION & CURRETTAGE/HYSTEROSCOPY WITH RESECTOCOPE N/A 04/27/2018   Procedure: DILATION  AND CURETTAGE / HYSTEROSCOPY WITH RESECTION;  Surgeon: Eldred Manges, MD;  Location: [facility];  Service: Gynecology;  Laterality: N/A;   No family history on file.  Review of Systems  Constitutional: Negative.   HENT: Negative.   Eyes: Negative.   Respiratory: Negative for cough, chest tightness and shortness of breath.   Cardiovascular: Negative for chest pain, palpitations and leg swelling.  Gastrointestinal: Negative for abdominal distention, abdominal pain, constipation, diarrhea, nausea and vomiting.  Musculoskeletal: Positive for back pain.  Skin: Negative.   Neurological: Negative.   Psychiatric/Behavioral: Negative.     Objective:  Physical Exam Constitutional:      Appearance: She is well-developed and well-nourished.  HENT:     Head: Normocephalic and atraumatic.  Eyes:     Extraocular Movements: EOM normal.  Cardiovascular:     Rate and Rhythm: Normal rate and regular rhythm.  Pulmonary:     Effort: Pulmonary effort is normal. No respiratory distress.     Breath sounds: Normal breath sounds. No wheezing or rales.  Abdominal:      General: Bowel sounds are normal. There is no distension.     Palpations: Abdomen is soft.     Tenderness: There is no abdominal tenderness. There is no rebound.  Musculoskeletal:        General: No edema.     Cervical back: Normal range of motion.  Skin:    General: Skin is warm and dry.     Comments: Foot exam done  Neurological:     Mental Status: She is alert and oriented to person, place, and time.     Coordination: Coordination normal.  Psychiatric:        Mood and Affect: Mood and affect normal.     Vitals:   10/03/20 1007  BP: 138/76  Pulse: 85  Resp: 18  Temp: 98.2 F (36.8 C)  TempSrc: Oral  SpO2: 97%  Weight: 156 lb (70.8 kg)  Height: 4\' 11"  (1.499 m)    This visit occurred during the SARS-CoV-2 public health emergency.  Safety protocols were in place, including screening questions prior to the visit, additional usage of staff PPE, and extensive cleaning of exam room while observing appropriate contact time as indicated for disinfecting solutions.   Assessment & Plan:  Pneumonia 23 given at visit

## 2020-10-03 NOTE — Patient Instructions (Signed)
Diabetes Mellitus and Standards of Medical Care Living with and managing diabetes (diabetes mellitus) can be complicated. Your diabetes treatment may be managed by a team of health care providers, including:  A physician who specializes in diabetes (endocrinologist). You might also have visits with a nurse practitioner or physician assistant.  Nurses.  A registered dietitian.  A certified diabetes care and education specialist.  An exercise specialist.  A pharmacist.  An eye doctor.  A foot specialist (podiatrist).  A dental care provider.  A primary care provider.  A mental health care provider. How to manage your diabetes You can do many things to successfully manage your diabetes. Your health care providers will follow guidelines to help you get the best quality of care. Here are general guidelines for your diabetes management plan. Your health care providers may give you more specific instructions. Physical exams When you are diagnosed with diabetes, and each year after that, your health care provider will ask about your medical and family history. You will have a physical exam, which may include:  Measuring your height, weight, and body mass index (BMI).  Checking your blood pressure. This will be done at every routine medical visit. Your target blood pressure may vary depending on your medical conditions, your age, and other factors.  A thyroid exam.  A skin exam.  Screening for nerve damage (peripheral neuropathy). This may include checking the pulse in your legs and feet and the level of sensation in your hands and feet.  A foot exam to inspect the structure and skin of your feet, including checking for cuts, bruises, redness, blisters, sores, or other problems.  Screening for blood vessel (vascular) problems. This may include checking the pulse in your legs and feet and checking your temperature. Blood tests Depending on your treatment plan and your personal needs,  you may have the following tests:  Hemoglobin A1C (HbA1C). This test provides information about blood sugar (glucose) control over the previous 2-3 months. It is used to adjust your treatment plan, if needed. This test will be done: ? At least 2 times a year, if you are meeting your treatment goals. ? 4 times a year, if you are not meeting your treatment goals or if your goals have changed.  Lipid testing, including total cholesterol, LDL and HDL cholesterol, and triglyceride levels. ? The goal for LDL is less than 100 mg/dL (5.5 mmol/L). If you are at high risk for complications, the goal is less than 70 mg/dL (3.9 mmol/L). ? The goal for HDL is 40 mg/dL (2.2 mmol/L) or higher for men, and 50 mg/dL (2.8 mmol/L) or higher for women. An HDL cholesterol of 60 mg/dL (3.3 mmol/L) or higher gives some protection against heart disease. ? The goal for triglycerides is less than 150 mg/dL (8.3 mmol/L).  Liver function tests.  Kidney function tests.  Thyroid function tests.   Dental and eye exams  Visit your dentist two times a year.  If you have type 1 diabetes, your health care provider may recommend an eye exam within 5 years after you are diagnosed, and then once a year after your first exam. ? For children with type 1 diabetes, the health care provider may recommend an eye exam when your child is age 11 or older and has had diabetes for 3-5 years. After the first exam, your child should get an eye exam once a year.  If you have type 2 diabetes, your health care provider may recommend an eye exam as   soon as you are diagnosed, and then every 1-2 years after your first exam.   Immunizations  A yearly flu (influenza) vaccine is recommended annually for everyone 6 months or older. This is especially important if you have diabetes.  The pneumonia (pneumococcal) vaccine is recommended for everyone 2 years or older who has diabetes. If you are age 16 or older, you may get the pneumonia vaccine as a  series of two separate shots.  The hepatitis B vaccine is recommended for adults shortly after being diagnosed with diabetes. Adults and children with diabetes should receive all other vaccines according to age-specific recommendations from the Centers for Disease Control and Prevention (CDC). Mental and emotional health Screening for symptoms of eating disorders, anxiety, and depression is recommended at the time of diagnosis and after as needed. If your screening shows that you have symptoms, you may need more evaluation. You may work with a mental health care provider. Follow these instructions at home: Treatment plan You will monitor your blood glucose levels and may give yourself insulin. Your treatment plan will be reviewed at every medical visit. You and your health care provider will discuss:  How you are taking your medicines, including insulin.  Any side effects you have.  Your blood glucose level target goals.  How often you monitor your blood glucose level.  Lifestyle habits, such as activity level and tobacco, alcohol, and substance use. Education Your health care provider will assess how well you are monitoring your blood glucose levels and whether you are taking your insulin and medicines correctly. He or she may refer you to:  A certified diabetes care and education specialist to manage your diabetes throughout your life, starting at diagnosis.  A registered dietitian who can create and review your personal nutrition plan.  An exercise specialist who can discuss your activity level and exercise plan. General instructions  Take over-the-counter and prescription medicines only as told by your health care provider.  Keep all follow-up visits. This is important. Where to find support There are many diabetes support networks, including:  American Diabetes Association (ADA): diabetes.org  Defeat Diabetes Foundation: defeatdiabetes.org Where to find more  information  American Diabetes Association (ADA): www.diabetes.org  Association of Diabetes Care & Education Specialists (ADCES): diabeteseducator.org  International Diabetes Federation (IDF): https://www.munoz-bell.org/ Summary  Managing diabetes (diabetes mellitus) can be complicated. Your diabetes treatment may be managed by a team of health care providers.  Your health care providers follow guidelines to help you get the best quality care.  You should have physical exams, blood tests, blood pressure monitoring, immunizations, and screening tests regularly. Stay updated on how to manage your diabetes.  Your health care providers may also give you more specific instructions based on your individual health. This information is not intended to replace advice given to you by your health care provider. Make sure you discuss any questions you have with your health care provider. Document Revised: 01/26/2020 Document Reviewed: 01/26/2020 Elsevier Patient Education  [place] Maintenance, Female Adopting a healthy lifestyle and getting preventive care are important in promoting health and wellness. Ask your health care provider about:  The right schedule for you to have regular tests and exams.  Things you can do on your own to prevent diseases and keep yourself healthy. What should I know about diet, weight, and exercise? Eat a healthy diet  Eat a diet that includes plenty of vegetables, fruits, low-fat dairy products, and lean protein.  Do not eat a lot of  foods that are high in solid fats, added sugars, or sodium.   Maintain a healthy weight Body mass index (BMI) is used to identify weight problems. It estimates body fat based on height and weight. Your health care provider can help determine your BMI and help you achieve or maintain a healthy weight. Get regular exercise Get regular exercise. This is one of the most important things you can do for your health. Most adults  should:  Exercise for at least 150 minutes each week. The exercise should increase your heart rate and make you sweat (moderate-intensity exercise).  Do strengthening exercises at least twice a week. This is in addition to the moderate-intensity exercise.  Spend less time sitting. Even light physical activity can be beneficial. Watch cholesterol and blood lipids Have your blood tested for lipids and cholesterol at 70 years of age, then have this test every 5 years. Have your cholesterol levels checked more often if:  Your lipid or cholesterol levels are high.  You are older than 70 years of age.  You are at high risk for heart disease. What should I know about cancer screening? Depending on your health history and family history, you may need to have cancer screening at various ages. This may include screening for:  Breast cancer.  Cervical cancer.  Colorectal cancer.  Skin cancer.  Lung cancer. What should I know about heart disease, diabetes, and high blood pressure? Blood pressure and heart disease  High blood pressure causes heart disease and increases the risk of stroke. This is more likely to develop in people who have high blood pressure readings, are of African descent, or are overweight.  Have your blood pressure checked: ? Every 3-5 years if you are 20-75 years of age. ? Every year if you are 69 years old or older. Diabetes Have regular diabetes screenings. This checks your fasting blood sugar level. Have the screening done:  Once every three years after age 41 if you are at a normal weight and have a low risk for diabetes.  More often and at a younger age if you are overweight or have a high risk for diabetes. What should I know about preventing infection? Hepatitis B If you have a higher risk for hepatitis B, you should be screened for this virus. Talk with your health care provider to find out if you are at risk for hepatitis B infection. Hepatitis C Testing  is recommended for:  Everyone born from [birth year] through 1965.  Anyone with known risk factors for hepatitis C. Sexually transmitted infections (STIs)  Get screened for STIs, including gonorrhea and chlamydia, if: ? You are sexually active and are younger than 71 years of age. ? You are older than 70 years of age and your health care provider tells you that you are at risk for this type of infection. ? Your sexual activity has changed since you were last screened, and you are at increased risk for chlamydia or gonorrhea. Ask your health care provider if you are at risk.  Ask your health care provider about whether you are at high risk for HIV. Your health care provider may recommend a prescription medicine to help prevent HIV infection. If you choose to take medicine to prevent HIV, you should first get tested for HIV. You should then be tested every 3 months for as long as you are taking the medicine. Pregnancy  If you are about to stop having your period (premenopausal) and you may become pregnant, seek counseling  before you get pregnant.  Take 400 to 800 micrograms (mcg) of folic acid every day if you become pregnant.  Ask for birth control (contraception) if you want to prevent pregnancy. Osteoporosis and menopause Osteoporosis is a disease in which the bones lose minerals and strength with aging. This can result in bone fractures. If you are 71 years old or older, or if you are at risk for osteoporosis and fractures, ask your health care provider if you should:  Be screened for bone loss.  Take a calcium or vitamin D supplement to lower your risk of fractures.  Be given hormone replacement therapy (HRT) to treat symptoms of menopause. Follow these instructions at home: Lifestyle  Do not use any products that contain nicotine or tobacco, such as cigarettes, e-cigarettes, and chewing tobacco. If you need help quitting, ask your health care provider.  Do not use street drugs.  Do not  share needles.  Ask your health care provider for help if you need support or information about quitting drugs. Alcohol use  Do not drink alcohol if: ? Your health care provider tells you not to drink. ? You are pregnant, may be pregnant, or are planning to become pregnant.  If you drink alcohol: ? Limit how much you use to 0-1 drink a day. ? Limit intake if you are breastfeeding.  Be aware of how much alcohol is in your drink. In the [place] one drink equals one 12 oz bottle of beer (355 mL), one 5 oz glass of wine (148 mL), or one 1 oz glass of hard liquor (44 mL). General instructions  Schedule regular health, dental, and eye exams.  Stay current with your vaccines.  Tell your health care provider if: ? You often feel depressed. ? You have ever been abused or do not feel safe at home. Summary  Adopting a healthy lifestyle and getting preventive care are important in promoting health and wellness.  Follow your health care provider's instructions about healthy diet, exercising, and getting tested or screened for diseases.  Follow your health care provider's instructions on monitoring your cholesterol and blood pressure. This information is not intended to replace advice given to you by your health care provider. Make sure you discuss any questions you have with your health care provider. Document Revised: 07/14/2018 Document Reviewed: 07/14/2018 Elsevier Patient Education  2021 [employer].

## 2020-10-04 NOTE — Assessment & Plan Note (Signed)
Checking HgA1c, foot exam done. Reminded about importance of eye exam. Microalbumin to creatinine ratio checked today. On ARB and statin although compliance is in question given no visit for about 1.5 years. Taking metformin 500 mg daily. Adjust as needed based on results.

## 2020-10-04 NOTE — Assessment & Plan Note (Signed)
BP at goal on losartan/hctz. Checking CMP and adjust as needed.

## 2020-10-04 NOTE — Assessment & Plan Note (Signed)
Flu shot declined. Covid-19 up to date including booster. Pneumonia 23 given today to complete series. Shingrix counseled to get at pharmacy. Tetanus due 2029. Colonoscopy due declines. Mammogram due declines, pap smear aged out and dexa due declines. Counseled about sun safety and mole surveillance. Counseled about the dangers of distracted driving. Given 10 year screening recommendations.

## 2020-10-04 NOTE — Assessment & Plan Note (Signed)
Taking pravastatin 20 mg daily. Checking lipid panel and adjust as needed for goal LDL <70.

## 2020-10-09 ENCOUNTER — Other Ambulatory Visit: Payer: Self-pay | Admitting: Internal Medicine

## 2020-12-26 ENCOUNTER — Telehealth: Payer: Self-pay | Admitting: Internal Medicine

## 2020-12-26 NOTE — Telephone Encounter (Signed)
Ok to refill? It was prescribed by Mickel Baas last Sept. Please advise

## 2020-12-26 NOTE — Telephone Encounter (Signed)
1.Medication Requested: meclizine (ANTIVERT) 25 MG tablet    2. Pharmacy (Name, Street, [place]): [id number] - , [place]  3. On Med List: yes   4. Last Visit with PCP: 10-03-20  5. Next visit date with PCP: 01-09-21   Agent: Please be advised that RX refills may take up to 3 business days. We ask that you follow-up with your pharmacy.

## 2020-12-26 NOTE — Telephone Encounter (Signed)
Should not be refilled this is not a long term medication.

## 2020-12-27 ENCOUNTER — Other Ambulatory Visit: Payer: Self-pay

## 2020-12-27 ENCOUNTER — Other Ambulatory Visit: Payer: Self-pay | Admitting: Internal Medicine

## 2020-12-27 ENCOUNTER — Ambulatory Visit (INDEPENDENT_AMBULATORY_CARE_PROVIDER_SITE_OTHER): Payer: Medicare Other | Admitting: Internal Medicine

## 2020-12-27 ENCOUNTER — Encounter: Payer: Self-pay | Admitting: Internal Medicine

## 2020-12-27 VITALS — BP 132/70 | HR 85 | Temp 97.8°F | Resp 18 | Ht 59.0 in | Wt 154.2 lb

## 2020-12-27 DIAGNOSIS — E2839 Other primary ovarian failure: Secondary | ICD-10-CM | POA: Diagnosis not present

## 2020-12-27 DIAGNOSIS — H8110 Benign paroxysmal vertigo, unspecified ear: Secondary | ICD-10-CM

## 2020-12-27 MED ORDER — METFORMIN HCL 500 MG PO TABS
500.0000 mg | ORAL_TABLET | Freq: Every day | ORAL | 3 refills | Status: DC
Start: 2020-12-27 — End: 2022-03-13

## 2020-12-27 MED ORDER — MECLIZINE HCL 25 MG PO TABS: 25.0000 mg | ORAL_TABLET | Freq: Three times a day (TID) | ORAL | 0 refills | Status: DC | PRN

## 2020-12-27 MED ORDER — LOSARTAN POTASSIUM-HCTZ 50-12.5 MG PO TABS
1.0000 | ORAL_TABLET | Freq: Every day | ORAL | 3 refills | Status: DC
Start: 2020-12-27 — End: 2021-10-10

## 2020-12-27 NOTE — Patient Instructions (Signed)
We have refilled the medicines and the meclizine.

## 2020-12-27 NOTE — Progress Notes (Signed)
   Subjective:   Patient ID: Kimberly Welch, female    DOB: 04-10-1951, 70 y.o.   MRN: [id number]  HPI The patient is a 70 YO female coming in for recurrent vertigo. Previously given some exercises and meclizine. Is out of meclizine and needs refill. Seems like weather change may have impact. Denies fevers or chills. Denies headache or sinus problems.   Review of Systems  Constitutional: Negative.   HENT: Negative.   Eyes: Negative.   Respiratory: Negative for cough, chest tightness and shortness of breath.   Cardiovascular: Negative for chest pain, palpitations and leg swelling.  Gastrointestinal: Negative for abdominal distention, abdominal pain, constipation, diarrhea, nausea and vomiting.  Musculoskeletal: Negative.   Skin: Negative.   Neurological: Positive for dizziness.  Psychiatric/Behavioral: Negative.     Objective:  Physical Exam Constitutional:      Appearance: She is well-developed.  HENT:     Head: Normocephalic and atraumatic.     Right Ear: Tympanic membrane normal.     Left Ear: Tympanic membrane normal.  Cardiovascular:     Rate and Rhythm: Normal rate and regular rhythm.  Pulmonary:     Effort: Pulmonary effort is normal. No respiratory distress.     Breath sounds: Normal breath sounds. No wheezing or rales.  Abdominal:     General: Bowel sounds are normal. There is no distension.     Palpations: Abdomen is soft.     Tenderness: There is no abdominal tenderness. There is no rebound.  Musculoskeletal:     Cervical back: Normal range of motion.  Skin:    General: Skin is warm and dry.  Neurological:     Mental Status: She is alert and oriented to person, place, and time.     Coordination: Coordination normal.     Vitals:   12/27/20 1045  BP: 132/70  Pulse: 85  Resp: 18  Temp: 97.8 F (36.6 C)  TempSrc: Oral  SpO2: 98%  Weight: 154 lb 3.2 oz (69.9 kg)  Height: 4\' 11"  (1.499 m)    This visit occurred during the SARS-CoV-2 public health emergency.   Safety protocols were in place, including screening questions prior to the visit, additional usage of staff PPE, and extensive cleaning of exam room while observing appropriate contact time as indicated for disinfecting solutions.   Assessment & Plan:

## 2020-12-28 ENCOUNTER — Encounter: Payer: Self-pay | Admitting: Internal Medicine

## 2020-12-28 DIAGNOSIS — H8110 Benign paroxysmal vertigo, unspecified ear: Secondary | ICD-10-CM | POA: Insufficient documentation

## 2020-12-28 NOTE — Assessment & Plan Note (Signed)
Recurrent and rx meclizine. Reminded about epley maneuver to try for symptoms also. Counseled not to take meclizine if not dizzy as this can cause dizziness.

## 2021-01-09 ENCOUNTER — Ambulatory Visit: Payer: Medicare Other | Admitting: Internal Medicine

## 2021-01-25 DIAGNOSIS — H52223 Regular astigmatism, bilateral: Secondary | ICD-10-CM | POA: Diagnosis not present

## 2021-01-25 DIAGNOSIS — H40013 Open angle with borderline findings, low risk, bilateral: Secondary | ICD-10-CM | POA: Diagnosis not present

## 2021-01-25 DIAGNOSIS — H25013 Cortical age-related cataract, bilateral: Secondary | ICD-10-CM | POA: Diagnosis not present

## 2021-01-25 DIAGNOSIS — H25043 Posterior subcapsular polar age-related cataract, bilateral: Secondary | ICD-10-CM | POA: Diagnosis not present

## 2021-01-25 DIAGNOSIS — H5203 Hypermetropia, bilateral: Secondary | ICD-10-CM | POA: Diagnosis not present

## 2021-01-25 DIAGNOSIS — E119 Type 2 diabetes mellitus without complications: Secondary | ICD-10-CM | POA: Diagnosis not present

## 2021-01-25 LAB — HM DIABETES EYE EXAM

## 2021-04-09 ENCOUNTER — Ambulatory Visit: Payer: Medicare Other | Admitting: Internal Medicine

## 2021-04-12 ENCOUNTER — Other Ambulatory Visit: Payer: Self-pay

## 2021-04-12 ENCOUNTER — Ambulatory Visit (INDEPENDENT_AMBULATORY_CARE_PROVIDER_SITE_OTHER): Payer: Medicare Other | Admitting: Internal Medicine

## 2021-04-12 ENCOUNTER — Encounter: Payer: Self-pay | Admitting: Internal Medicine

## 2021-04-12 VITALS — BP 124/82 | HR 82 | Temp 98.0°F | Resp 18 | Ht 59.0 in | Wt 153.2 lb

## 2021-04-12 DIAGNOSIS — R1032 Left lower quadrant pain: Secondary | ICD-10-CM | POA: Insufficient documentation

## 2021-04-12 DIAGNOSIS — E785 Hyperlipidemia, unspecified: Secondary | ICD-10-CM | POA: Diagnosis not present

## 2021-04-12 DIAGNOSIS — E669 Obesity, unspecified: Secondary | ICD-10-CM | POA: Diagnosis not present

## 2021-04-12 DIAGNOSIS — E1169 Type 2 diabetes mellitus with other specified complication: Secondary | ICD-10-CM | POA: Diagnosis not present

## 2021-04-12 DIAGNOSIS — Z1211 Encounter for screening for malignant neoplasm of colon: Secondary | ICD-10-CM | POA: Diagnosis not present

## 2021-04-12 DIAGNOSIS — Z23 Encounter for immunization: Secondary | ICD-10-CM

## 2021-04-12 LAB — COMPREHENSIVE METABOLIC PANEL WITH GFR
ALT: 13 U/L (ref 0–35)
AST: 20 U/L (ref 0–37)
Albumin: 4.5 g/dL (ref 3.5–5.2)
Alkaline Phosphatase: 83 U/L (ref 39–117)
BUN: 14 mg/dL (ref 6–23)
CO2: 29 meq/L (ref 19–32)
Calcium: 10 mg/dL (ref 8.4–10.5)
Chloride: 98 meq/L (ref 96–112)
Creatinine, Ser: 0.86 mg/dL (ref 0.40–1.20)
GFR: 68.34 mL/min
Glucose, Bld: 98 mg/dL (ref 70–99)
Potassium: 3.7 meq/L (ref 3.5–5.1)
Sodium: 136 meq/L (ref 135–145)
Total Bilirubin: 0.4 mg/dL (ref 0.2–1.2)
Total Protein: 7.7 g/dL (ref 6.0–8.3)

## 2021-04-12 LAB — CBC
HCT: 37 % (ref 36.0–46.0)
Hemoglobin: 11.8 g/dL — ABNORMAL LOW (ref 12.0–15.0)
MCHC: 31.8 g/dL (ref 30.0–36.0)
MCV: 87.9 fl (ref 78.0–100.0)
Platelets: 308 10*3/uL (ref 150.0–400.0)
RBC: 4.21 Mil/uL (ref 3.87–5.11)
RDW: 13.8 % (ref 11.5–15.5)
WBC: 7.4 10*3/uL (ref 4.0–10.5)

## 2021-04-12 LAB — LIPID PANEL
Cholesterol: 153 mg/dL (ref 0–200)
HDL: 43.8 mg/dL
LDL Cholesterol: 82 mg/dL (ref 0–99)
NonHDL: 109.06
Total CHOL/HDL Ratio: 3
Triglycerides: 135 mg/dL (ref 0.0–149.0)
VLDL: 27 mg/dL (ref 0.0–40.0)

## 2021-04-12 LAB — HEMOGLOBIN A1C: Hgb A1c MFr Bld: 6.7 % — ABNORMAL HIGH (ref 4.6–6.5)

## 2021-04-12 LAB — LIPASE: Lipase: 39 U/L (ref 11.0–59.0)

## 2021-04-12 NOTE — Assessment & Plan Note (Signed)
Checking HgA1c and adjust as needed. She is taking metformin 500 mg daily. Is on statin and ARB.

## 2021-04-12 NOTE — Progress Notes (Signed)
   Subjective:   Patient ID: Kimberly Welch, female    DOB: Sep 08, 1950, 70 y.o.   MRN: [id number]  Abdominal Pain Pertinent negatives include no constipation, diarrhea, nausea or vomiting.  The patient is a 70 YO female coming in for follow up of cholesterol and sugars. Was not taking pravastatin consistently and since March has been taking it again. Also with new LLQ pain started within 6 months ago had 1 episode which did not last long. Last night had an episode which lasted a longer time and was severe 10/10 pain.   Review of Systems  Constitutional: Negative.   HENT: Negative.    Eyes: Negative.   Respiratory:  Negative for cough, chest tightness and shortness of breath.   Cardiovascular:  Negative for chest pain, palpitations and leg swelling.  Gastrointestinal:  Positive for abdominal pain. Negative for abdominal distention, constipation, diarrhea, nausea and vomiting.  Musculoskeletal: Negative.   Skin: Negative.   Neurological: Negative.   Psychiatric/Behavioral: Negative.     Objective:  Physical Exam Constitutional:      Appearance: She is well-developed.  HENT:     Head: Normocephalic and atraumatic.  Cardiovascular:     Rate and Rhythm: Normal rate and regular rhythm.  Pulmonary:     Effort: Pulmonary effort is normal. No respiratory distress.     Breath sounds: Normal breath sounds. No wheezing or rales.  Abdominal:     General: Bowel sounds are normal. There is no distension.     Palpations: Abdomen is soft.     Tenderness: There is no abdominal tenderness. There is no rebound.  Musculoskeletal:     Cervical back: Normal range of motion.  Skin:    General: Skin is warm and dry.  Neurological:     Mental Status: She is alert and oriented to person, place, and time.     Coordination: Coordination normal.    Vitals:   04/12/21 1014  BP: 124/82  Pulse: 82  Resp: 18  Temp: 98 F (36.7 C)  TempSrc: Oral  SpO2: 95%  Weight: 153 lb 3.2 oz (69.5 kg)  Height: 4'  11" (1.499 m)    This visit occurred during the SARS-CoV-2 public health emergency.  Safety protocols were in place, including screening questions prior to the visit, additional usage of staff PPE, and extensive cleaning of exam room while observing appropriate contact time as indicated for disinfecting solutions.   Assessment & Plan:  Flu shot given at visit

## 2021-04-12 NOTE — Assessment & Plan Note (Signed)
Checking CBC and CMP and lipase today to rule out causes. Checking CT Abdomen and pelvis without contrast to rule out diverticulitis as she has diverticulosis on last colonoscopy 2009. Also referral to GI as she is overdue for colonoscopy.

## 2021-04-12 NOTE — Patient Instructions (Signed)
We will get the labs today and then get the scan of the stomach to check out the pain.

## 2021-04-12 NOTE — Assessment & Plan Note (Signed)
Checking lipid panel today and adjust pravastatin 20 mg daily now that she is taking regularly. Goal LDL <70.

## 2021-04-15 ENCOUNTER — Other Ambulatory Visit: Payer: Self-pay

## 2021-04-15 ENCOUNTER — Ambulatory Visit
Admission: RE | Admit: 2021-04-15 | Discharge: 2021-04-15 | Disposition: A | Discharge status: Home / Self Care | Payer: Medicare Other | Source: Ambulatory Visit | Attending: Internal Medicine | Admitting: Internal Medicine

## 2021-04-15 DIAGNOSIS — R1032 Left lower quadrant pain: Secondary | ICD-10-CM | POA: Diagnosis not present

## 2021-04-15 DIAGNOSIS — R109 Unspecified abdominal pain: Secondary | ICD-10-CM | POA: Diagnosis not present

## 2021-04-15 DIAGNOSIS — D259 Leiomyoma of uterus, unspecified: Secondary | ICD-10-CM | POA: Diagnosis not present

## 2021-04-15 DIAGNOSIS — K760 Fatty (change of) liver, not elsewhere classified: Secondary | ICD-10-CM | POA: Diagnosis not present

## 2021-04-19 ENCOUNTER — Other Ambulatory Visit: Payer: Self-pay | Admitting: Internal Medicine

## 2021-04-19 DIAGNOSIS — K389 Disease of appendix, unspecified: Secondary | ICD-10-CM

## 2021-04-20 ENCOUNTER — Encounter (HOSPITAL_COMMUNITY): Payer: Self-pay | Admitting: Emergency Medicine

## 2021-04-20 ENCOUNTER — Encounter (HOSPITAL_COMMUNITY): Payer: Self-pay | Admitting: *Deleted

## 2021-04-20 ENCOUNTER — Other Ambulatory Visit: Payer: Self-pay

## 2021-04-20 ENCOUNTER — Emergency Department (HOSPITAL_COMMUNITY): Payer: Medicare Other

## 2021-04-20 ENCOUNTER — Inpatient Hospital Stay (HOSPITAL_COMMUNITY)
Admission: EM | Admit: 2021-04-20 | Discharge: 2021-04-24 | DRG: 339 | Disposition: A | Discharge status: Home / Self Care | Payer: Medicare Other | Attending: Surgery | Admitting: Surgery

## 2021-04-20 ENCOUNTER — Telehealth: Payer: Self-pay | Admitting: Internal Medicine

## 2021-04-20 ENCOUNTER — Ambulatory Visit (HOSPITAL_COMMUNITY)
Admission: EM | Admit: 2021-04-20 | Discharge: 2021-04-20 | Disposition: A | Discharge status: Nursing Home (Includes ICF) | Payer: Medicare Other | Attending: Urgent Care | Admitting: Urgent Care

## 2021-04-20 DIAGNOSIS — K567 Ileus, unspecified: Secondary | ICD-10-CM | POA: Diagnosis not present

## 2021-04-20 DIAGNOSIS — K219 Gastro-esophageal reflux disease without esophagitis: Secondary | ICD-10-CM | POA: Diagnosis not present

## 2021-04-20 DIAGNOSIS — R100 Acute abdomen: Secondary | ICD-10-CM

## 2021-04-20 DIAGNOSIS — R935 Abnormal findings on diagnostic imaging of other abdominal regions, including retroperitoneum: Secondary | ICD-10-CM

## 2021-04-20 DIAGNOSIS — K36 Other appendicitis: Secondary | ICD-10-CM | POA: Diagnosis not present

## 2021-04-20 DIAGNOSIS — K3533 Acute appendicitis with perforation and localized peritonitis, with abscess: Principal | ICD-10-CM | POA: Diagnosis present

## 2021-04-20 DIAGNOSIS — I10 Essential (primary) hypertension: Secondary | ICD-10-CM | POA: Diagnosis not present

## 2021-04-20 DIAGNOSIS — E119 Type 2 diabetes mellitus without complications: Secondary | ICD-10-CM | POA: Diagnosis present

## 2021-04-20 DIAGNOSIS — Z79899 Other long term (current) drug therapy: Secondary | ICD-10-CM | POA: Diagnosis not present

## 2021-04-20 DIAGNOSIS — Z7984 Long term (current) use of oral hypoglycemic drugs: Secondary | ICD-10-CM | POA: Diagnosis not present

## 2021-04-20 DIAGNOSIS — C181 Malignant neoplasm of appendix: Secondary | ICD-10-CM | POA: Diagnosis not present

## 2021-04-20 DIAGNOSIS — E669 Obesity, unspecified: Secondary | ICD-10-CM | POA: Diagnosis not present

## 2021-04-20 DIAGNOSIS — R1031 Right lower quadrant pain: Secondary | ICD-10-CM

## 2021-04-20 DIAGNOSIS — E785 Hyperlipidemia, unspecified: Secondary | ICD-10-CM | POA: Diagnosis present

## 2021-04-20 DIAGNOSIS — K760 Fatty (change of) liver, not elsewhere classified: Secondary | ICD-10-CM | POA: Diagnosis not present

## 2021-04-20 DIAGNOSIS — K3580 Unspecified acute appendicitis: Secondary | ICD-10-CM

## 2021-04-20 DIAGNOSIS — I152 Hypertension secondary to endocrine disorders: Secondary | ICD-10-CM | POA: Diagnosis present

## 2021-04-20 DIAGNOSIS — Z20822 Contact with and (suspected) exposure to covid-19: Secondary | ICD-10-CM | POA: Diagnosis not present

## 2021-04-20 DIAGNOSIS — E1169 Type 2 diabetes mellitus with other specified complication: Secondary | ICD-10-CM | POA: Diagnosis not present

## 2021-04-20 DIAGNOSIS — K3532 Acute appendicitis with perforation and localized peritonitis, without abscess: Secondary | ICD-10-CM | POA: Diagnosis not present

## 2021-04-20 HISTORY — DX: Unspecified acute appendicitis: K35.80

## 2021-04-20 LAB — COMPREHENSIVE METABOLIC PANEL
ALT: 13 U/L (ref 0–44)
AST: 20 U/L (ref 15–41)
Albumin: 4.1 g/dL (ref 3.5–5.0)
Alkaline Phosphatase: 85 U/L (ref 38–126)
Anion gap: 11 (ref 5–15)
BUN: 10 mg/dL (ref 8–23)
CO2: 27 mmol/L (ref 22–32)
Calcium: 9.5 mg/dL (ref 8.9–10.3)
Chloride: 97 mmol/L — ABNORMAL LOW (ref 98–111)
Creatinine, Ser: 0.82 mg/dL (ref 0.44–1.00)
GFR, Estimated: >60 mL/min (ref >60)
Glucose, Bld: 113 mg/dL — ABNORMAL HIGH (ref 70–99)
Potassium: 3.9 mmol/L (ref 3.5–5.1)
Sodium: 135 mmol/L (ref 135–145)
Total Bilirubin: 0.7 mg/dL (ref 0.3–1.2)
Total Protein: 7.3 g/dL (ref 6.5–8.1)

## 2021-04-20 LAB — URINALYSIS, ROUTINE W REFLEX MICROSCOPIC
Bilirubin Urine: NEGATIVE
Glucose, UA: NEGATIVE mg/dL
Hgb urine dipstick: NEGATIVE
Ketones, ur: NEGATIVE mg/dL
Leukocytes,Ua: NEGATIVE
Nitrite: NEGATIVE
Protein, ur: NEGATIVE mg/dL
Specific Gravity, Urine: 1.01 (ref 1.005–1.030)
pH: 6 (ref 5.0–8.0)

## 2021-04-20 LAB — TYPE AND SCREEN
ABO/RH(D): B POS
Antibody Screen: NEGATIVE

## 2021-04-20 LAB — CK: Total CK: 113 U/L (ref 38–234)

## 2021-04-20 LAB — CBC
HCT: 36.9 % (ref 36.0–46.0)
Hemoglobin: 12 g/dL (ref 12.0–15.0)
MCH: 28.2 pg (ref 26.0–34.0)
MCHC: 32.5 g/dL (ref 30.0–36.0)
MCV: 86.6 fL (ref 80.0–100.0)
Platelets: 298 10*3/uL (ref 150–400)
RBC: 4.26 MIL/uL (ref 3.87–5.11)
RDW: 13.8 % (ref 11.5–15.5)
WBC: 10.3 10*3/uL (ref 4.0–10.5)
nRBC: 0 % (ref 0.0–0.2)

## 2021-04-20 LAB — SEDIMENTATION RATE: Sed Rate: 32 mm/hr — ABNORMAL HIGH (ref 0–22)

## 2021-04-20 LAB — RESP PANEL BY RT-PCR (FLU A&B, COVID) ARPGX2
Influenza A by PCR: NEGATIVE
Influenza B by PCR: NEGATIVE
SARS Coronavirus 2 by RT PCR: NEGATIVE

## 2021-04-20 LAB — LIPASE, BLOOD: Lipase: 30 U/L (ref 11–51)

## 2021-04-20 LAB — ABO/RH: ABO/RH(D): B POS

## 2021-04-20 LAB — CBG MONITORING, ED: Glucose-Capillary: 142 mg/dL — ABNORMAL HIGH (ref 70–99)

## 2021-04-20 MED ORDER — IOHEXOL 350 MG/ML SOLN
100.0000 mL | Freq: Once | INTRAVENOUS | Status: AC | PRN
  Administered 2021-04-20: 100 mL via INTRAVENOUS

## 2021-04-20 MED ORDER — PIPERACILLIN-TAZOBACTAM 3.375 G IVPB 30 MIN
3.3750 g | Freq: Once | INTRAVENOUS | Status: AC
  Administered 2021-04-20: 3.375 g via INTRAVENOUS
  Filled 2021-04-20: qty 50

## 2021-04-20 MED ORDER — PIPERACILLIN-TAZOBACTAM 3.375 G IVPB
3.3750 g | Freq: Three times a day (TID) | INTRAVENOUS | Status: DC
  Administered 2021-04-21 – 2021-04-24 (×10): 3.375 g via INTRAVENOUS
  Filled 2021-04-20 (×13): qty 50

## 2021-04-20 MED ORDER — PRAVASTATIN SODIUM 10 MG PO TABS: 20.0000 mg | ORAL_TABLET | Freq: Every day | ORAL | Status: DC

## 2021-04-20 MED ORDER — MORPHINE SULFATE (PF) 2 MG/ML IV SOLN
2.0000 mg | INTRAVENOUS | Status: DC | PRN
Start: 2021-04-20 — End: 2021-04-21

## 2021-04-20 MED ORDER — MORPHINE SULFATE (PF) 2 MG/ML IV SOLN
2.0000 mg | Freq: Once | INTRAVENOUS | Status: AC
  Administered 2021-04-20: 2 mg via INTRAVENOUS
  Filled 2021-04-20: qty 1

## 2021-04-20 MED ORDER — ONDANSETRON HCL 4 MG/2ML IJ SOLN
4.0000 mg | Freq: Four times a day (QID) | INTRAMUSCULAR | Status: DC | PRN
  Administered 2021-04-21 – 2021-04-22 (×3): 4 mg via INTRAVENOUS

## 2021-04-20 MED ORDER — SODIUM CHLORIDE 0.9 % IV BOLUS
500.0000 mL | Freq: Once | INTRAVENOUS | Status: AC
  Administered 2021-04-20: 500 mL via INTRAVENOUS

## 2021-04-20 MED ORDER — HYDROCODONE-ACETAMINOPHEN 5-325 MG PO TABS
ORAL_TABLET | ORAL | Status: AC
  Filled 2021-04-20: qty 1

## 2021-04-20 MED ORDER — LACTATED RINGERS IV SOLN: INTRAVENOUS | Status: DC

## 2021-04-20 MED ORDER — MECLIZINE HCL 25 MG PO TABS: 25.0000 mg | ORAL_TABLET | Freq: Three times a day (TID) | ORAL | Status: DC | PRN

## 2021-04-20 MED ORDER — ONDANSETRON HCL 4 MG/2ML IJ SOLN
4.0000 mg | Freq: Once | INTRAMUSCULAR | Status: AC
  Administered 2021-04-20: 4 mg via INTRAVENOUS
  Filled 2021-04-20: qty 2

## 2021-04-20 MED ORDER — FAMOTIDINE IN NACL 20-0.9 MG/50ML-% IV SOLN
20.0000 mg | Freq: Once | INTRAVENOUS | Status: AC
  Administered 2021-04-20: 20 mg via INTRAVENOUS
  Filled 2021-04-20: qty 50

## 2021-04-20 MED ORDER — HYDROCODONE-ACETAMINOPHEN 5-325 MG PO TABS: 1.0000 | ORAL_TABLET | Freq: Once | ORAL | Status: DC

## 2021-04-20 MED ORDER — INSULIN ASPART 100 UNIT/ML IJ SOLN
0.0000 [IU] | INTRAMUSCULAR | Status: DC
  Administered 2021-04-20 – 2021-04-21 (×3): 1 [IU] via SUBCUTANEOUS
  Administered 2021-04-21 (×2): 2 [IU] via SUBCUTANEOUS
  Administered 2021-04-22 – 2021-04-23 (×4): 1 [IU] via SUBCUTANEOUS
  Administered 2021-04-24: 2 [IU] via SUBCUTANEOUS

## 2021-04-20 NOTE — Consult Note (Signed)
Consulting Physician: Nickola Major Quavon Keisling  Referring Provider: Dr. Pearline Cables (ER provider  Chief Complaint: Abdominal pain  Reason for Consult: Appendicitis   Subjective   HPI: Kimberly Welch is an 70 y.o. female who is here for abdominal pain.  She has been having pain for a number of days now.  Infection a CT scan 5 days ago which showed a slight abnormal appearance of the appendix.  Her symptoms have continued with abdominal pain, lack of appetite, and general malaise.  Past Medical History:  Diagnosis Date   Cholelithiasis    symptomatic   GERD (gastroesophageal reflux disease)    Hyperlipidemia    Hypertension    Type 2 diabetes mellitus ([place])    Wears partial dentures    UPPER AND LOWER    Past Surgical History:  Procedure Laterality Date   CHOLECYSTECTOMY N/A 11/28/2015   Procedure: LAPAROSCOPIC CHOLECYSTECTOMY;  Surgeon: Mickeal Skinner, MD;  Location: [facility]  Service: General;  Laterality: N/A;   D & C HYSTEROSCOPY/  RESECTION ENDOMETRIAL POLYP'S  01-11-2009   DILATATION & CURRETTAGE/HYSTEROSCOPY WITH RESECTOCOPE N/A 04/27/2018   Procedure: DILATION  AND CURETTAGE / HYSTEROSCOPY WITH RESECTION;  Surgeon: Eldred Manges, MD;  Location: [facility];  Service: Gynecology;  Laterality: N/A;    Family History  Problem Relation Age of Onset   Prostate cancer Brother    Colon cancer Neg Hx    Appendicitis Neg Hx     Social:  reports that she has never smoked. She has never used smokeless tobacco. She reports current alcohol use. She reports that she does not use drugs.  Allergies:  Allergies  Allergen Reactions   Sulfa Antibiotics Swelling    Medications: Current Outpatient Medications  Medication Instructions   acetaminophen (TYLENOL) 325 mg, Oral, Every 6 hours PRN   Artificial Tear Ointment (DRY EYES OP) 1 drop, Both Eyes, Daily PRN   aspirin EC 162 mg, Oral, Daily PRN, Swallow whole.   aspirin EC 325 mg, Oral, Daily  PRN, Pt only takes this when she doesn't have the 81 mg tabs   losartan-hydrochlorothiazide (HYZAAR) 50-12.5 MG tablet 1 tablet, Oral, Daily   meclizine (ANTIVERT) 25 mg, Oral, 3 times daily PRN   metFORMIN (GLUCOPHAGE) 500 mg, Oral, Daily with breakfast   ondansetron (ZOFRAN) 8 mg, Oral, Every 8 hours PRN   pravastatin (PRAVACHOL) 20 mg, Oral, Daily    ROS - all of the below systems have been reviewed with the patient and positives are indicated with bold text General: chills, fever or night sweats Eyes: blurry vision or double vision ENT: epistaxis or sore throat Allergy/Immunology: itchy/watery eyes or nasal congestion Hematologic/Lymphatic: bleeding problems, blood clots or swollen lymph nodes Endocrine: temperature intolerance or unexpected weight changes Breast: new or changing breast lumps or nipple discharge Resp: cough, shortness of breath, or wheezing CV: chest pain or dyspnea on exertion GI: as per HPI GU: dysuria, trouble voiding, or hematuria MSK: joint pain or joint stiffness Neuro: TIA or stroke symptoms Derm: pruritus and skin lesion changes Psych: anxiety and depression  Objective   PE Blood pressure 117/70, pulse 82, temperature 98.1 F (36.7 C), temperature source Oral, resp. rate 16, height '4\' 10"'$  (1.473 m), weight 69.4 kg, last menstrual period 08/05/2003, SpO2 100 %. Constitutional: NAD; conversant; no deformities Eyes: Moist conjunctiva; no lid lag; anicteric; PERRL Neck: Trachea midline; no thyromegaly Lungs: Normal respiratory effort; no tactile fremitus CV: RRR; no palpable thrills; no pitting edema GI: Abd soft,  tender to palpation in the right lower quadrant.; no palpable hepatosplenomegaly MSK: Normal range of motion of extremities; no clubbing/cyanosis Psychiatric: Appropriate affect; alert and oriented x3 Lymphatic: No palpable cervical or axillary lymphadenopathy  Results for orders placed or performed during the hospital encounter of 04/20/21  (from the past 24 hour(s))  Lipase, blood     Status: None   Collection Time: 04/20/21  5:38 PM  Result Value Ref Range   Lipase 30 11 - 51 U/L  Comprehensive metabolic panel     Status: Abnormal   Collection Time: 04/20/21  5:38 PM  Result Value Ref Range   Sodium 135 135 - 145 mmol/L   Potassium 3.9 3.5 - 5.1 mmol/L   Chloride 97 (L) 98 - 111 mmol/L   CO2 27 22 - 32 mmol/L   Glucose, Bld 113 (H) 70 - 99 mg/dL   BUN 10 8 - 23 mg/dL   Creatinine, Ser 0.82 0.44 - 1.00 mg/dL   Calcium 9.5 8.9 - 10.3 mg/dL   Total Protein 7.3 6.5 - 8.1 g/dL   Albumin 4.1 3.5 - 5.0 g/dL   AST 20 15 - 41 U/L   ALT 13 0 - 44 U/L   Alkaline Phosphatase 85 38 - 126 U/L   Total Bilirubin 0.7 0.3 - 1.2 mg/dL   GFR, Estimated >60 >60 mL/min   Anion gap 11 5 - 15  CBC     Status: None   Collection Time: 04/20/21  5:38 PM  Result Value Ref Range   WBC 10.3 4.0 - 10.5 K/uL   RBC 4.26 3.87 - 5.11 MIL/uL   Hemoglobin 12.0 12.0 - 15.0 g/dL   HCT 36.9 36.0 - 46.0 %   MCV 86.6 80.0 - 100.0 fL   MCH 28.2 26.0 - 34.0 pg   MCHC 32.5 30.0 - 36.0 g/dL   RDW 13.8 11.5 - 15.5 %   Platelets 298 150 - 400 K/uL   nRBC 0.0 0.0 - 0.2 %  ABO/Rh     Status: None   Collection Time: 04/20/21  5:38 PM  Result Value Ref Range   ABO/RH(D)      B POS Performed at [facility], [address], [place], [place] [address]   Urinalysis, Routine w reflex microscopic     Status: None   Collection Time: 04/20/21  6:15 PM  Result Value Ref Range   Color, Urine YELLOW YELLOW   APPearance CLEAR CLEAR   Specific Gravity, Urine 1.010 1.005 - 1.030   pH 6.0 5.0 - 8.0   Glucose, UA NEGATIVE NEGATIVE mg/dL   Hgb urine dipstick NEGATIVE NEGATIVE   Bilirubin Urine NEGATIVE NEGATIVE   Ketones, ur NEGATIVE NEGATIVE mg/dL   Protein, ur NEGATIVE NEGATIVE mg/dL   Nitrite NEGATIVE NEGATIVE   Leukocytes,Ua NEGATIVE NEGATIVE  Sedimentation rate     Status: Abnormal   Collection Time: 04/20/21  6:15 PM  Result Value Ref Range    Sed Rate 32 (H) 0 - 22 mm/hr  CK     Status: None   Collection Time: 04/20/21  6:15 PM  Result Value Ref Range   Total CK 113 38 - 234 U/L  Resp Panel by RT-PCR (Flu A&B, Covid)     Status: None   Collection Time: 04/20/21  6:15 PM   Specimen: Nasopharyngeal(NP) swabs in vial transport medium  Result Value Ref Range   SARS Coronavirus 2 by RT PCR NEGATIVE NEGATIVE   Influenza A by PCR NEGATIVE NEGATIVE  Influenza B by PCR NEGATIVE NEGATIVE  Type and screen [place]     Status: None   Collection Time: 04/20/21  6:39 PM  Result Value Ref Range   ABO/RH(D) B POS    Antibody Screen NEG    Sample Expiration      04/23/2021,2359 Performed at [facility], [place] [address] [place], [place] [address]      Imaging Orders         CT ABDOMEN PELVIS W CONTRAST      Assessment and Plan   Tavonna Slemp is an 70 y.o. female with appendicitis.  I recommended laparoscopic appendectomy.  The procedure itself as well as its risk, benefits, and alternatives will be discussed with the patient.  We will add her onto the schedule for Dr. Kae Heller who takes over the general surgery service in the morning.    ICD-10-CM   1. Acute appendicitis, unspecified acute appendicitis type  K35.80     2. Right lower quadrant abdominal pain  R10.31     3. Hepatic steatosis  K76.0        Felicie Morn, [facility] Use [email or web address] to contact on call provider

## 2021-04-20 NOTE — ED Provider Notes (Signed)
Kimberly Welch   MRN: [id number] DOB: April 08, 1951  Subjective:   Kimberly Welch is a 70 y.o. female presenting for recheck on worsening abdominal pain. Patient had a CT scan with results as below. She has a referral pending to a surgeon but her pain is now more constant, worsening. Has had a difficult time moving from her pain. Denies fever, chest pain, shob, n/v.   No current facility-administered medications for this encounter.  Current Outpatient Medications:    losartan-hydrochlorothiazide (HYZAAR) 50-12.5 MG tablet, Take 1 tablet by mouth daily., Disp: 90 tablet, Rfl: 3   metFORMIN (GLUCOPHAGE) 500 MG tablet, Take 1 tablet (500 mg total) by mouth daily with breakfast., Disp: 90 tablet, Rfl: 3   pravastatin (PRAVACHOL) 20 MG tablet, Take 1 tablet (20 mg total) by mouth daily., Disp: 90 tablet, Rfl: 3   ibuprofen (ADVIL,MOTRIN) 600 MG tablet, Take 1 tablet every 6 hours for 3 days and then 1 tablet every 6 hours as needed for pain (Patient not taking: Reported on 04/12/2021), Disp: 60 tablet, Rfl: 0   meclizine (ANTIVERT) 25 MG tablet, Take 1 tablet (25 mg total) by mouth 3 (three) times daily as needed for dizziness., Disp: 30 tablet, Rfl: 0   meloxicam (MOBIC) 7.5 MG tablet, Take 1 tablet (7.5 mg total) by mouth daily. (Patient not taking: Reported on 04/12/2021), Disp: 15 tablet, Rfl: 0   ondansetron (ZOFRAN) 8 MG tablet, Take 1 tablet (8 mg total) by mouth every 8 (eight) hours as needed for nausea or vomiting., Disp: 20 tablet, Rfl: 0   Allergies  Allergen Reactions   Sulfa Antibiotics Swelling    Past Medical History:  Diagnosis Date   Cholelithiasis    symptomatic   GERD (gastroesophageal reflux disease)    Hyperlipidemia    Hypertension    Type 2 diabetes mellitus ([place])    Wears partial dentures    UPPER AND LOWER     Past Surgical History:  Procedure Laterality Date   CHOLECYSTECTOMY N/A 11/28/2015   Procedure: LAPAROSCOPIC CHOLECYSTECTOMY;  Surgeon: Mickeal Skinner, MD;  Location: [facility];  Service: General;  Laterality: N/A;   D & C HYSTEROSCOPY/  RESECTION ENDOMETRIAL POLYP'S  01-11-2009   DILATATION & CURRETTAGE/HYSTEROSCOPY WITH RESECTOCOPE N/A 04/27/2018   Procedure: DILATION  AND CURETTAGE / HYSTEROSCOPY WITH RESECTION;  Surgeon: Eldred Manges, MD;  Location: [facility];  Service: Gynecology;  Laterality: N/A;    No family history on file.  Social History   Tobacco Use   Smoking status: Never   Smokeless tobacco: Never  Substance Use Topics   Alcohol use: Yes    Alcohol/week: 0.0 standard drinks    Comment: RARE   Drug use: No    ROS   Objective:   Vitals: BP 131/80   Pulse 81   Temp 97.6 F (36.4 C) (Oral)   Resp 18   LMP 08/05/2003   SpO2 96%   Physical Exam Constitutional:      General: She is not in acute distress.    Appearance: Normal appearance. She is well-developed. She is not ill-appearing, toxic-appearing or diaphoretic.  HENT:     Head: Normocephalic and atraumatic.     Nose: Nose normal.     Mouth/Throat:     Mouth: Mucous membranes are moist.     Pharynx: Oropharynx is clear.  Eyes:     General: No scleral icterus.       Right eye: No discharge.  Left eye: No discharge.     Extraocular Movements: Extraocular movements intact.     Conjunctiva/sclera: Conjunctivae normal.     Pupils: Pupils are equal, round, and reactive to light.  Cardiovascular:     Rate and Rhythm: Normal rate.  Pulmonary:     Effort: Pulmonary effort is normal.  Abdominal:     General: Bowel sounds are normal. There is no distension.     Palpations: Abdomen is soft. There is no mass.     Tenderness: There is abdominal tenderness in the right lower quadrant. There is guarding. There is no right CVA tenderness, left CVA tenderness or rebound. Positive signs include Rovsing's sign and McBurney's sign. Negative signs include Murphy's sign.  Skin:    General: Skin is warm and dry.   Neurological:     General: No focal deficit present.     Mental Status: She is alert and oriented to person, place, and time.  Psychiatric:        Mood and Affect: Mood normal.        Behavior: Behavior normal.        Thought Content: Thought content normal.        Judgment: Judgment normal.    CT Abdomen Pelvis Wo Contrast  Result Date: 04/15/2021 CLINICAL DATA:  Left lower quadrant pain.  Diverticulitis suspected. EXAM: CT ABDOMEN AND PELVIS WITHOUT CONTRAST TECHNIQUE: Multidetector CT imaging of the abdomen and pelvis was performed following the standard protocol without IV contrast. COMPARISON:  None. FINDINGS: Lower chest: Unremarkable. Hepatobiliary: The liver shows diffusely decreased attenuation suggesting fat deposition. No focal abnormality in the liver on this study without intravenous contrast. Nonvisualization gallbladder suggest prior cholecystectomy No intrahepatic or extrahepatic biliary dilation. Pancreas: No focal mass lesion. No dilatation of the main duct. No intraparenchymal cyst. No peripancreatic edema. Spleen: No splenomegaly. No focal mass lesion. Adrenals/Urinary Tract: No adrenal nodule or mass. Unremarkable noncontrast appearance of the kidneys. No evidence for hydroureter. The urinary bladder appears normal for the degree of distention. Stomach/Bowel: Stomach is unremarkable. No gastric wall thickening. No evidence of outlet obstruction. Duodenum is normally positioned as is the ligament of Treitz. No small bowel wall thickening. No small bowel dilatation. The terminal ileum is normal. The tip of the appendix is dilated up to 2.2 cm diameter and filled with low-density material. There is no periappendiceal edema or inflammation. Appendiceal lumen is un opacified despite good opacification of the distal ileum and right colon. No gross colonic mass. No colonic wall thickening. Only trace diverticular disease noted in the left colon without features of diverticulitis.  Vascular/Lymphatic: There is mild atherosclerotic calcification of the abdominal aorta without aneurysm. There is no gastrohepatic or hepatoduodenal ligament lymphadenopathy. No retroperitoneal or mesenteric lymphadenopathy. No pelvic sidewall lymphadenopathy. Reproductive: Multiple uterine fibroids evident. There is no adnexal mass. Other: No intraperitoneal free fluid. Musculoskeletal: No worrisome lytic or sclerotic osseous abnormality. IMPRESSION: 1. The tip of the appendix is dilated up to 2.2 cm diameter and filled with low-density material. There is no periappendiceal edema or inflammation. Appendiceal lumen is un opacified despite good opacification of the distal ileum and right colon. Imaging features are most suggestive of mucocele although neoplasm can have this appearance. Surgical consultation recommended. 2. No findings to explain the patient's history of left lower quadrant pain. There is only trace diverticular disease in the left colon without findings of diverticulitis. 3. Uterine fibroids. 4. Hepatic steatosis. 5. Aortic Atherosclerosis (ICD10-I70.0). These results will be called to the ordering clinician or  representative by the Radiologist Assistant, and communication documented in the PACS or [employer]. Electronically Signed   By: Misty Stanley M.D.   On: 04/15/2021 10:08     Assessment and Plan :   PDMP not reviewed this encounter.  1. Acute abdomen   2. Abnormal CT of the abdomen   3. RLQ abdominal pain     Patient is in need of a higher level of care than we can provide in the urgent care setting. Recommended further evaluation in the emergency room for recheck and rule out of an acute abdomen. I do not recommend she wait to be seen by her surgeon.    Jaynee Eagles, PA-C 04/20/21 1610

## 2021-04-20 NOTE — ED Notes (Signed)
Patient is being discharged from the [facility] and sent to the Emergency Department via wheelchair by staff. Per M. Mani, [place], patient is stable but in need of higher level of care due to acute abd pain w/ enlarged appendix noted on CT yesterday. Patient is aware and verbalizes understanding of plan of care.  Vitals:   04/20/21 1548  BP: 131/80  Pulse: 81  Resp: 18  Temp: 97.6 F (36.4 C)  SpO2: 96%

## 2021-04-20 NOTE — H&P (Signed)
History and Physical    Kimberly Welch [id number] DOB: 14-Feb-1951 DOA: 04/20/2021  PCP: Hoyt Koch, MD  Patient coming from: Home  I have personally briefly reviewed patient's old medical records in [place]  Chief Complaint: Abd pain  HPI: Kimberly Welch is a 70 y.o. female with medical history significant of DM2, HTN, HLD.  Pt with RLQ abd pain for 5+ days.  Had CT on 9/12 which showed abnormal appearing appendix.  Symptoms persistent.  Poor appetite.  Pain worse with direct pressure or eating.  Pain 4/10.  No CP, no N/V.   ED Course: CT today shows development of appendicitis, unruptured, cant rule out neoplasm.  No SIRS at this time.   Review of Systems: As per HPI, otherwise all review of systems negative.  Past Medical History:  Diagnosis Date   Cholelithiasis    symptomatic   GERD (gastroesophageal reflux disease)    Hyperlipidemia    Hypertension    Type 2 diabetes mellitus ([place])    Wears partial dentures    UPPER AND LOWER    Past Surgical History:  Procedure Laterality Date   CHOLECYSTECTOMY N/A 11/28/2015   Procedure: LAPAROSCOPIC CHOLECYSTECTOMY;  Surgeon: Mickeal Skinner, MD;  Location: [facility];  Service: General;  Laterality: N/A;   D & C HYSTEROSCOPY/  RESECTION ENDOMETRIAL POLYP'S  01-11-2009   DILATATION & CURRETTAGE/HYSTEROSCOPY WITH RESECTOCOPE N/A 04/27/2018   Procedure: DILATION  AND CURETTAGE / HYSTEROSCOPY WITH RESECTION;  Surgeon: Eldred Manges, MD;  Location: [facility];  Service: Gynecology;  Laterality: N/A;     reports that she has never smoked. She has never used smokeless tobacco. She reports current alcohol use. She reports that she does not use drugs.  Allergies  Allergen Reactions   Sulfa Antibiotics Swelling    Family History  Problem Relation Age of Onset   Prostate cancer Brother    Colon cancer Neg Hx    Appendicitis Neg Hx      Prior to Admission medications    Medication Sig Start Date End Date Taking? Authorizing Provider  acetaminophen (TYLENOL) 325 MG tablet Take 325 mg by mouth every 6 (six) hours as needed for moderate pain or headache.   Yes [provider]  Artificial Tear Ointment (DRY EYES OP) Place 1 drop into both eyes daily as needed (dry eyes).   Yes [provider]  aspirin EC 325 MG tablet Take 325 mg by mouth daily as needed (when eating fried food.). Pt only takes this when she doesn't have the 81 mg tabs   Yes [provider]  aspirin EC 81 MG tablet Take 162 mg by mouth daily as needed (when eat fried foods). Swallow whole.   Yes [provider]  losartan-hydrochlorothiazide (HYZAAR) 50-12.5 MG tablet Take 1 tablet by mouth daily. 12/27/20  Yes Hoyt Koch, MD  meclizine (ANTIVERT) 25 MG tablet Take 1 tablet (25 mg total) by mouth 3 (three) times daily as needed for dizziness. 12/27/20  Yes Hoyt Koch, MD  metFORMIN (GLUCOPHAGE) 500 MG tablet Take 1 tablet (500 mg total) by mouth daily with breakfast. 12/27/20  Yes Hoyt Koch, MD  ondansetron (ZOFRAN) 8 MG tablet Take 1 tablet (8 mg total) by mouth every 8 (eight) hours as needed for nausea or vomiting. 04/20/20  Yes Marrian Salvage, FNP  pravastatin (PRAVACHOL) 20 MG tablet Take 1 tablet (20 mg total) by mouth daily. Patient taking differently: Take 20 mg by  mouth at bedtime. 10/03/20  Yes Hoyt Koch, MD    Physical Exam: Vitals:   04/20/21 1843 04/20/21 1902 04/20/21 1945 04/20/21 2130  BP:  116/66 (!) 145/84 117/70  Pulse:  77 89 82  Resp:  '18 16 16  '$ Temp:      TempSrc:      SpO2:  99% 100% 100%  Weight: 69.4 kg     Height: '4\' 10"'$  (1.473 m)       Constitutional: NAD, calm, comfortable Eyes: PERRL, lids and conjunctivae normal ENMT: Mucous membranes are moist. Posterior pharynx clear of any exudate or lesions.Normal dentition.  Neck: normal, supple, no masses, no thyromegaly Respiratory:  clear to auscultation bilaterally, no wheezing, no crackles. Normal respiratory effort. No accessory muscle use.  Cardiovascular: Regular rate and rhythm, no murmurs / rubs / gallops. No extremity edema. 2+ pedal pulses. No carotid bruits.  Abdomen: RLQ TTP, no rebound Musculoskeletal: no clubbing / cyanosis. No joint deformity upper and lower extremities. Good ROM, no contractures. Normal muscle tone.  Skin: no rashes, lesions, ulcers. No induration Neurologic: CN 2-12 grossly intact. Sensation intact, DTR normal. Strength 5/5 in all 4.  Psychiatric: Normal judgment and insight. Alert and oriented x 3. Normal mood.    Labs on Admission: I have personally reviewed following labs and imaging studies  CBC: Recent Labs  Lab 04/20/21 1738  WBC 10.3  HGB 12.0  HCT 36.9  MCV 86.6  PLT [id number]   Basic Metabolic Panel: Recent Labs  Lab 04/20/21 1738  NA 135  K 3.9  CL 97*  CO2 27  GLUCOSE 113*  BUN 10  CREATININE 0.82  CALCIUM 9.5   GFR: Estimated Creatinine Clearance: 52.7 mL/min (by C-G formula based on SCr of 0.82 mg/dL). Liver Function Tests: Recent Labs  Lab 04/20/21 1738  AST 20  ALT 13  ALKPHOS 85  BILITOT 0.7  PROT 7.3  ALBUMIN 4.1   Recent Labs  Lab 04/20/21 1738  LIPASE 30   No results for input(s): AMMONIA in the last 168 hours. Coagulation Profile: No results for input(s): INR, PROTIME in the last 168 hours. Cardiac Enzymes: Recent Labs  Lab 04/20/21 1815  CKTOTAL 113   BNP (last 3 results) No results for input(s): PROBNP in the last 8760 hours. HbA1C: No results for input(s): HGBA1C in the last 72 hours. CBG: No results for input(s): GLUCAP in the last 168 hours. Lipid Profile: No results for input(s): CHOL, HDL, LDLCALC, TRIG, CHOLHDL, LDLDIRECT in the last 72 hours. Thyroid Function Tests: No results for input(s): TSH, T4TOTAL, FREET4, T3FREE, THYROIDAB in the last 72 hours. Anemia Panel: No results for input(s): VITAMINB12, FOLATE, FERRITIN,  TIBC, IRON, RETICCTPCT in the last 72 hours. Urine analysis:    Component Value Date/Time   COLORURINE YELLOW 04/20/2021 1815   APPEARANCEUR CLEAR 04/20/2021 1815   LABSPEC 1.010 04/20/2021 1815   PHURINE 6.0 04/20/2021 1815   GLUCOSEU NEGATIVE 04/20/2021 1815   HGBUR NEGATIVE 04/20/2021 1815   BILIRUBINUR NEGATIVE 04/20/2021 1815   KETONESUR NEGATIVE 04/20/2021 1815   PROTEINUR NEGATIVE 04/20/2021 1815   UROBILINOGEN 0.2 11/25/2007 2100   NITRITE NEGATIVE 04/20/2021 1815   LEUKOCYTESUR NEGATIVE 04/20/2021 1815    Radiological Exams on Admission: CT ABDOMEN PELVIS W CONTRAST  Result Date: 04/20/2021 CLINICAL DATA:  Right lower quadrant pain.  Worsened EXAM: CT ABDOMEN AND PELVIS WITH CONTRAST TECHNIQUE: Multidetector CT imaging of the abdomen and pelvis was performed using the standard protocol following bolus administration of intravenous contrast. CONTRAST:  174m OMNIPAQUE IOHEXOL 350 MG/ML SOLN COMPARISON:  CT abdomen pelvis 04/15/2021 FINDINGS: Lower chest: No acute abnormality. Hepatobiliary: The hepatic parenchyma is diffusely hypodense compared to the splenic parenchyma consistent with fatty infiltration. No focal liver abnormality. The gallbladder is not visualized and is likely surgically absent. No biliary dilatation. Pancreas: No focal lesion. Normal pancreatic contour. No surrounding inflammatory changes. No main pancreatic ductal dilatation. Spleen: Normal in size without focal abnormality. Adrenals/Urinary Tract: No adrenal nodule bilaterally. Bilateral kidneys enhance symmetrically. No hydronephrosis. No hydroureter. The urinary bladder is unremarkable. Stomach/Bowel: Stomach is within normal limits. No evidence of bowel wall thickening or dilatation. Scattered colonic diverticulosis. The tip of the appendix is again noted to be enlarged in caliber measuring up to 23 mm. Interval development of periappendiceal fat stranding and appendiceal wall thickening. No definite appendiceal  wall discontinuity. The appendix is noted to course posteroinferior to the cecum. Vascular/Lymphatic: No abdominal aorta or iliac aneurysm. Mild atherosclerotic plaque of the aorta and its branches. No abdominal, pelvic, or inguinal lymphadenopathy. Reproductive: There is a 5.5 cm heterogeneous slightly hypodense lesion within the posterior wall of the uterus. Subcentimeter calcified lesions are also noted within the uterine wall. Otherwise the uterus and bilateral adnexa are unremarkable. Other: No intraperitoneal free fluid. No intraperitoneal free gas. No organized fluid collection. Musculoskeletal: No abdominal wall hernia or abnormality. No suspicious lytic or blastic osseous lesions. No acute displaced fracture. IMPRESSION: 1. Interval development of non-perforated acute tip appendicitis. Underlying mucocele or malignancy is not excluded. Recommend surgical consultation. 2. Scattered colonic diverticulosis with no acute diverticulitis. 3. Hepatic steatosis. 4. Uterine fibroids with the largest measuring up to 5.5 cm. 5.  Aortic Atherosclerosis (ICD10-I70.0). Electronically Signed   By: MIven FinnM.D.   On: 04/20/2021 19:53    EKG: Independently reviewed.  Assessment/Plan Principal Problem:   Acute appendicitis Active Problems:   Diabetes mellitus type 2 in obese ([facility]   Essential hypertension    Acute appendicitis - General surgery busy with multiple critically ill trauma cases and is currently in OR, so medicine will admit this patient Plan per Dr. SLetitia Neriis OR for appendectomy in AM NPO after MN IVF: LR at [id number] Morphine IV PRN pain DM2 - Hold metformin Sensitive SSI Q4H HTN - holding lisinopril-hctz for the moment HLD - hold statin for the moment  DVT prophylaxis: SCDs Code Status: Full Family Communication: No family in room Disposition Plan: Home after surgical management of appendicitis Consults called: Dr. SLetitia NeriAdmission status: Admit to  inpatient  Severity of Illness: The appropriate patient status for this patient is INPATIENT. Inpatient status is judged to be reasonable and necessary in order to provide the required intensity of service to ensure the patient's safety. The patient's presenting symptoms, physical exam findings, and initial radiographic and laboratory data in the context of their chronic comorbidities is felt to place them at high risk for further clinical deterioration. Furthermore, it is not anticipated that the patient will be medically stable for discharge from the hospital within 2 midnights of admission. The following factors support the patient status of inpatient.   IP status due to acute appendicitis, needs OR management.  * I certify that at the point of admission it is my clinical judgment that the patient will require inpatient hospital care spanning beyond 2 midnights from the point of admission due to high intensity of service, high risk for further deterioration and high frequency of surveillance required.*   Eulene Pekar M. DO [facility]  How  to contact the [facility] Attending or Consulting provider [place] or covering provider during after hours [place], for this patient?  Check the care team in [facility] and look for a) attending/consulting [facility] provider listed and b) the [facility] team listed Log into [email or web address]  Amion Physician Scheduling and messaging for groups and whole hospitals  On call and physician scheduling software for group practices, residents, hospitalists and other medical providers for call, clinic, rotation and shift schedules. OnCall [employer] is a hospital-wide system for scheduling doctors and paging doctors on call. EasyPlot is for scientific plotting and data analysis.  [email or web address]  and use [place]'s universal password to access. If you do not have the password, please contact the hospital operator.  Locate the [facility] provider you are looking for under [facility] and  page to a number that you can be directly reached. If you still have difficulty reaching the provider, please page the [facility] (Director on Call) for the Hospitalists listed on amion for assistance.  04/20/2021, 10:25 PM

## 2021-04-20 NOTE — ED Provider Notes (Signed)
[facility] EMERGENCY DEPARTMENT Provider Note   CSN: [id number] Arrival date & time: 04/20/21  1627     History Chief Complaint  Patient presents with   Abdominal Pain    Kimberly Welch is a 70 y.o. female.  This is a 70 y.o. female with significant medical history as below, including cholelithiasis, type 2 diabetes, hypertension hyperlipidemia who presents to the ED with complaint of abdominal pain.  Location: Right lower quadrant Duration: 5 days Onset: At rest, gradual Timing: Intermittent initially now constant Description: Aching, dull, intermittent radiation to her lower back on the right Severity: 4 out of 10 Exacerbating/Alleviating Factors: Worsened with direct pressure.  Pain worsened with eating Associated Symptoms: Poor appetite. Pertinent Negatives: No fevers, chills, nausea, vomiting, change to bowel or bladder function.  No Chest pain or dyspnea.   Patient was seen in urgent care, enlarged appendix on CT, she was supposed to follow-up with general surgery as an outpatient however pain worsened and she reports the ER for further evaluation.  The history is provided by the patient. No language interpreter was used.  Abdominal Pain Pain location:  RLQ Pain quality: aching   Pain radiates to:  R flank Pain severity:  Mild Onset quality:  Gradual Duration:  5 days Timing:  Constant Progression:  Worsening Associated symptoms: no chest pain, no chills, no cough, no fever, no hematuria, no nausea, no shortness of breath and no vomiting       Past Medical History:  Diagnosis Date   Cholelithiasis    symptomatic   GERD (gastroesophageal reflux disease)    Hyperlipidemia    Hypertension    Type 2 diabetes mellitus ([place])    Wears partial dentures    UPPER AND LOWER    Patient Active Problem List   Diagnosis Date Noted   LLQ pain 04/12/2021   BPPV (benign paroxysmal positional vertigo) 12/28/2020   Fibroids 04/27/2018   Atypical  squamous cells cannot exclude high grade squamous intraepithelial lesion on cytologic smear of cervix (ASC-H) 04/27/2018   Right hip pain 01/06/2017   Hemorrhoid 05/01/2016   Elevated LFTs 10/09/2015   Diabetes mellitus type 2 in obese ([place]) 07/05/2014   Palpitations 07/05/2014   Hyperlipidemia associated with type 2 diabetes mellitus ([place]) 07/05/2014   Essential hypertension 07/05/2014   Routine general medical examination at a health care facility 07/05/2014    Past Surgical History:  Procedure Laterality Date   CHOLECYSTECTOMY N/A 11/28/2015   Procedure: LAPAROSCOPIC CHOLECYSTECTOMY;  Surgeon: Mickeal Skinner, MD;  Location: [facility];  Service: General;  Laterality: N/A;   D & C HYSTEROSCOPY/  RESECTION ENDOMETRIAL POLYP'S  01-11-2009   DILATATION & CURRETTAGE/HYSTEROSCOPY WITH RESECTOCOPE N/A 04/27/2018   Procedure: DILATION  AND CURETTAGE / HYSTEROSCOPY WITH RESECTION;  Surgeon: Eldred Manges, MD;  Location: [facility];  Service: Gynecology;  Laterality: N/A;     OB History   No obstetric history on file.     No family history on file.  Social History   Tobacco Use   Smoking status: Never   Smokeless tobacco: Never  Vaping Use   Vaping Use: Never used  Substance Use Topics   Alcohol use: Yes    Alcohol/week: 0.0 standard drinks    Comment: RARE   Drug use: No    Home Medications Prior to Admission medications   Medication Sig Start Date End Date Taking? Authorizing Provider  ibuprofen (ADVIL,MOTRIN) 600 MG tablet Take 1 tablet every 6 hours  for 3 days and then 1 tablet every 6 hours as needed for pain Patient not taking: Reported on 04/12/2021 04/27/18   Eldred Manges, MD  losartan-hydrochlorothiazide (HYZAAR) 50-12.5 MG tablet Take 1 tablet by mouth daily. 12/27/20   Hoyt Koch, MD  meclizine (ANTIVERT) 25 MG tablet Take 1 tablet (25 mg total) by mouth 3 (three) times daily as needed for dizziness. 12/27/20    Hoyt Koch, MD  meloxicam (MOBIC) 7.5 MG tablet Take 1 tablet (7.5 mg total) by mouth daily. Patient not taking: Reported on 04/12/2021 05/18/20   Raylene Everts, MD  metFORMIN (GLUCOPHAGE) 500 MG tablet Take 1 tablet (500 mg total) by mouth daily with breakfast. 12/27/20   Hoyt Koch, MD  ondansetron (ZOFRAN) 8 MG tablet Take 1 tablet (8 mg total) by mouth every 8 (eight) hours as needed for nausea or vomiting. 04/20/20   Marrian Salvage, FNP  pravastatin (PRAVACHOL) 20 MG tablet Take 1 tablet (20 mg total) by mouth daily. 10/03/20   Hoyt Koch, MD    Allergies    Sulfa antibiotics  Review of Systems   Review of Systems  Constitutional:  Positive for appetite change. Negative for chills and fever.  HENT:  Negative for facial swelling and trouble swallowing.   Eyes:  Negative for photophobia and visual disturbance.  Respiratory:  Negative for cough and shortness of breath.   Cardiovascular:  Negative for chest pain and palpitations.  Gastrointestinal:  Positive for abdominal pain. Negative for nausea and vomiting.  Endocrine: Negative for polydipsia and polyuria.  Genitourinary:  Negative for difficulty urinating and hematuria.  Musculoskeletal:  Negative for gait problem and joint swelling.  Skin:  Negative for pallor and rash.  Neurological:  Negative for syncope and headaches.  Psychiatric/Behavioral:  Negative for agitation and confusion.    Physical Exam Updated Vital Signs BP 117/70   Pulse 82   Temp 98.1 F (36.7 C) (Oral)   Resp 16   Ht '4\' 10"'  (1.473 m)   Wt 69.4 kg   LMP 08/05/2003   SpO2 100%   BMI 31.98 kg/m   Physical Exam Vitals and nursing note reviewed.  Constitutional:      General: She is not in acute distress.    Appearance: Normal appearance.  HENT:     Head: Normocephalic and atraumatic.     Right Ear: External ear normal.     Left Ear: External ear normal.     Nose: Nose normal.     Mouth/Throat:     Mouth:  Mucous membranes are moist.  Eyes:     General: No scleral icterus.       Right eye: No discharge.        Left eye: No discharge.  Cardiovascular:     Rate and Rhythm: Normal rate and regular rhythm.     Pulses: Normal pulses.     Heart sounds: Normal heart sounds.  Pulmonary:     Effort: Pulmonary effort is normal. No respiratory distress.     Breath sounds: Normal breath sounds.  Abdominal:     General: Abdomen is flat.     Tenderness: There is abdominal tenderness in the right lower quadrant.  Musculoskeletal:        General: Normal range of motion.     Cervical back: Normal range of motion.     Right lower leg: No edema.     Left lower leg: No edema.  Skin:    General: Skin  is warm and dry.     Capillary Refill: Capillary refill takes less than 2 seconds.  Neurological:     Mental Status: She is alert and oriented to person, place, and time.     GCS: GCS eye subscore is 4. GCS verbal subscore is 5. GCS motor subscore is 6.  Psychiatric:        Mood and Affect: Mood normal.        Behavior: Behavior normal.    ED Results / Procedures / Treatments   Labs (all labs ordered are listed, but only abnormal results are displayed) Labs Reviewed  COMPREHENSIVE METABOLIC PANEL - Abnormal; Notable for the following components:      Result Value   Chloride 97 (*)    Glucose, Bld 113 (*)    All other components within normal limits  SEDIMENTATION RATE - Abnormal; Notable for the following components:   Sed Rate 32 (*)    All other components within normal limits  RESP PANEL BY RT-PCR (FLU A&B, COVID) ARPGX2  CULTURE, BLOOD (ROUTINE X 2)  CULTURE, BLOOD (ROUTINE X 2)  LIPASE, BLOOD  CBC  URINALYSIS, ROUTINE W REFLEX MICROSCOPIC  CK  TYPE AND SCREEN  ABO/RH    EKG None  Radiology CT ABDOMEN PELVIS W CONTRAST  Result Date: 04/20/2021 CLINICAL DATA:  Right lower quadrant pain.  Worsened EXAM: CT ABDOMEN AND PELVIS WITH CONTRAST TECHNIQUE: Multidetector CT imaging of the  abdomen and pelvis was performed using the standard protocol following bolus administration of intravenous contrast. CONTRAST:  11m OMNIPAQUE IOHEXOL 350 MG/ML SOLN COMPARISON:  CT abdomen pelvis 04/15/2021 FINDINGS: Lower chest: No acute abnormality. Hepatobiliary: The hepatic parenchyma is diffusely hypodense compared to the splenic parenchyma consistent with fatty infiltration. No focal liver abnormality. The gallbladder is not visualized and is likely surgically absent. No biliary dilatation. Pancreas: No focal lesion. Normal pancreatic contour. No surrounding inflammatory changes. No main pancreatic ductal dilatation. Spleen: Normal in size without focal abnormality. Adrenals/Urinary Tract: No adrenal nodule bilaterally. Bilateral kidneys enhance symmetrically. No hydronephrosis. No hydroureter. The urinary bladder is unremarkable. Stomach/Bowel: Stomach is within normal limits. No evidence of bowel wall thickening or dilatation. Scattered colonic diverticulosis. The tip of the appendix is again noted to be enlarged in caliber measuring up to 23 mm. Interval development of periappendiceal fat stranding and appendiceal wall thickening. No definite appendiceal wall discontinuity. The appendix is noted to course posteroinferior to the cecum. Vascular/Lymphatic: No abdominal aorta or iliac aneurysm. Mild atherosclerotic plaque of the aorta and its branches. No abdominal, pelvic, or inguinal lymphadenopathy. Reproductive: There is a 5.5 cm heterogeneous slightly hypodense lesion within the posterior wall of the uterus. Subcentimeter calcified lesions are also noted within the uterine wall. Otherwise the uterus and bilateral adnexa are unremarkable. Other: No intraperitoneal free fluid. No intraperitoneal free gas. No organized fluid collection. Musculoskeletal: No abdominal wall hernia or abnormality. No suspicious lytic or blastic osseous lesions. No acute displaced fracture. IMPRESSION: 1. Interval development  of non-perforated acute tip appendicitis. Underlying mucocele or malignancy is not excluded. Recommend surgical consultation. 2. Scattered colonic diverticulosis with no acute diverticulitis. 3. Hepatic steatosis. 4. Uterine fibroids with the largest measuring up to 5.5 cm. 5.  Aortic Atherosclerosis (ICD10-I70.0). Electronically Signed   By: MIven FinnM.D.   On: 04/20/2021 19:53    Procedures Procedures   Medications Ordered in ED Medications  piperacillin-tazobactam (ZOSYN) IVPB 3.375 g (has no administration in time range)  piperacillin-tazobactam (ZOSYN) IVPB 3.375 g (has no administration in  time range)  famotidine (PEPCID) IVPB 20 mg premix (0 mg Intravenous Stopped 04/20/21 1938)  sodium chloride 0.9 % bolus 500 mL (0 mLs Intravenous Stopped 04/20/21 1938)  iohexol (OMNIPAQUE) 350 MG/ML injection 100 mL (100 mLs Intravenous Contrast Given 04/20/21 1934)  morphine 2 MG/ML injection 2 mg (2 mg Intravenous Given 04/20/21 2155)  ondansetron (ZOFRAN) injection 4 mg (4 mg Intravenous Given 04/20/21 2155)    ED Course  I have reviewed the triage vital signs and the nursing notes.  Pertinent labs & imaging results that were available during my care of the patient were reviewed by me and considered in my medical decision making (see chart for details).    MDM Rules/Calculators/A&P                           This patient complains of abdominal pain RLQ; this involves an extensive number of treatment Options and is a complaint that carries with it a high risk of complications and Morbidity. Vital signs reviewed. RLQ is TTP, not peritoneal. Serious etiologies considered.    Labs reviewed, mild elevation to ESR. WBC is WNL. Labs otherwise stable.  CT abdomen/pelvis with IV contrast with likely acute appendicitis to the tip of the appendix. Also possibility of malignancy per radiology.  Blood cultures sent, started IV zosyn.  Findings discussed w/ patient, agreeable to plan. Pain improved  following analgesics. No N/V. Abdomen is soft with RLQ pain ongoing but mildly improved.   D/w general surgery Dr Sondra Barges, recommends OR in the morning. NPO @ MN.   D/w medicine, accepts patient for admission, agree with plan for OR as soon as possible by gen surg.   Final Clinical Impression(s) / ED Diagnoses Final diagnoses:  Acute appendicitis, unspecified acute appendicitis type  Right lower quadrant abdominal pain  Hepatic steatosis    Rx / DC Orders ED Discharge Orders     None        Jeanell Sparrow, DO 04/20/21 2156

## 2021-04-20 NOTE — Discharge Instructions (Signed)
Please report to the hospital now as your abdominal pain is worsening and I am concerned that you have an acute abdomen. Your recent CT scan was abnormal involving the appendix and the area that you are hurting. We have given you hydrocodone for your severe pain here in the clinic but will need a higher level of care than we can provide in the urgent care setting. Please head to the hospital now.

## 2021-04-20 NOTE — ED Notes (Signed)
Pt given sandwich and ginger ale.

## 2021-04-20 NOTE — H&P (View-Only) (Signed)
Consulting Physician: Nickola Major Donal Lynam  Referring Provider: Dr. Pearline Cables (ER provider  Chief Complaint: Abdominal pain  Reason for Consult: Appendicitis   Subjective   HPI: Kimberly Welch is an 70 y.o. female who is here for abdominal pain.  She has been having pain for a number of days now.  Infection a CT scan 5 days ago which showed a slight abnormal appearance of the appendix.  Her symptoms have continued with abdominal pain, lack of appetite, and general malaise.  Past Medical History:  Diagnosis Date   Cholelithiasis    symptomatic   GERD (gastroesophageal reflux disease)    Hyperlipidemia    Hypertension    Type 2 diabetes mellitus ([place])    Wears partial dentures    UPPER AND LOWER    Past Surgical History:  Procedure Laterality Date   CHOLECYSTECTOMY N/A 11/28/2015   Procedure: LAPAROSCOPIC CHOLECYSTECTOMY;  Surgeon: Mickeal Skinner, MD;  Location: [facility];  Service: General;  Laterality: N/A;   D & C HYSTEROSCOPY/  RESECTION ENDOMETRIAL POLYP'S  01-11-2009   DILATATION & CURRETTAGE/HYSTEROSCOPY WITH RESECTOCOPE N/A 04/27/2018   Procedure: DILATION  AND CURETTAGE / HYSTEROSCOPY WITH RESECTION;  Surgeon: Eldred Manges, MD;  Location: [facility];  Service: Gynecology;  Laterality: N/A;    Family History  Problem Relation Age of Onset   Prostate cancer Brother    Colon cancer Neg Hx    Appendicitis Neg Hx     Social:  reports that she has never smoked. She has never used smokeless tobacco. She reports current alcohol use. She reports that she does not use drugs.  Allergies:  Allergies  Allergen Reactions   Sulfa Antibiotics Swelling    Medications: Current Outpatient Medications  Medication Instructions   acetaminophen (TYLENOL) 325 mg, Oral, Every 6 hours PRN   Artificial Tear Ointment (DRY EYES OP) 1 drop, Both Eyes, Daily PRN   aspirin EC 162 mg, Oral, Daily PRN, Swallow whole.   aspirin EC 325 mg, Oral, Daily  PRN, Pt only takes this when she doesn't have the 81 mg tabs   losartan-hydrochlorothiazide (HYZAAR) 50-12.5 MG tablet 1 tablet, Oral, Daily   meclizine (ANTIVERT) 25 mg, Oral, 3 times daily PRN   metFORMIN (GLUCOPHAGE) 500 mg, Oral, Daily with breakfast   ondansetron (ZOFRAN) 8 mg, Oral, Every 8 hours PRN   pravastatin (PRAVACHOL) 20 mg, Oral, Daily    ROS - all of the below systems have been reviewed with the patient and positives are indicated with bold text General: chills, fever or night sweats Eyes: blurry vision or double vision ENT: epistaxis or sore throat Allergy/Immunology: itchy/watery eyes or nasal congestion Hematologic/Lymphatic: bleeding problems, blood clots or swollen lymph nodes Endocrine: temperature intolerance or unexpected weight changes Breast: new or changing breast lumps or nipple discharge Resp: cough, shortness of breath, or wheezing CV: chest pain or dyspnea on exertion GI: as per HPI GU: dysuria, trouble voiding, or hematuria MSK: joint pain or joint stiffness Neuro: TIA or stroke symptoms Derm: pruritus and skin lesion changes Psych: anxiety and depression  Objective   PE Blood pressure 117/70, pulse 82, temperature 98.1 F (36.7 C), temperature source Oral, resp. rate 16, height '4\' 10"'$  (1.473 m), weight 69.4 kg, last menstrual period 08/05/2003, SpO2 100 %. Constitutional: NAD; conversant; no deformities Eyes: Moist conjunctiva; no lid lag; anicteric; PERRL Neck: Trachea midline; no thyromegaly Lungs: Normal respiratory effort; no tactile fremitus CV: RRR; no palpable thrills; no pitting edema GI: Abd soft,  tender to palpation in the right lower quadrant.; no palpable hepatosplenomegaly MSK: Normal range of motion of extremities; no clubbing/cyanosis Psychiatric: Appropriate affect; alert and oriented x3 Lymphatic: No palpable cervical or axillary lymphadenopathy  Results for orders placed or performed during the hospital encounter of 04/20/21  (from the past 24 hour(s))  Lipase, blood     Status: None   Collection Time: 04/20/21  5:38 PM  Result Value Ref Range   Lipase 30 11 - 51 U/L  Comprehensive metabolic panel     Status: Abnormal   Collection Time: 04/20/21  5:38 PM  Result Value Ref Range   Sodium 135 135 - 145 mmol/L   Potassium 3.9 3.5 - 5.1 mmol/L   Chloride 97 (L) 98 - 111 mmol/L   CO2 27 22 - 32 mmol/L   Glucose, Bld 113 (H) 70 - 99 mg/dL   BUN 10 8 - 23 mg/dL   Creatinine, Ser 0.82 0.44 - 1.00 mg/dL   Calcium 9.5 8.9 - 10.3 mg/dL   Total Protein 7.3 6.5 - 8.1 g/dL   Albumin 4.1 3.5 - 5.0 g/dL   AST 20 15 - 41 U/L   ALT 13 0 - 44 U/L   Alkaline Phosphatase 85 38 - 126 U/L   Total Bilirubin 0.7 0.3 - 1.2 mg/dL   GFR, Estimated >60 >60 mL/min   Anion gap 11 5 - 15  CBC     Status: None   Collection Time: 04/20/21  5:38 PM  Result Value Ref Range   WBC 10.3 4.0 - 10.5 K/uL   RBC 4.26 3.87 - 5.11 MIL/uL   Hemoglobin 12.0 12.0 - 15.0 g/dL   HCT 36.9 36.0 - 46.0 %   MCV 86.6 80.0 - 100.0 fL   MCH 28.2 26.0 - 34.0 pg   MCHC 32.5 30.0 - 36.0 g/dL   RDW 13.8 11.5 - 15.5 %   Platelets 298 150 - 400 K/uL   nRBC 0.0 0.0 - 0.2 %  ABO/Rh     Status: None   Collection Time: 04/20/21  5:38 PM  Result Value Ref Range   ABO/RH(D)      B POS Performed at [facility], [address] [place], [place] [address]   Urinalysis, Routine w reflex microscopic     Status: None   Collection Time: 04/20/21  6:15 PM  Result Value Ref Range   Color, Urine YELLOW YELLOW   APPearance CLEAR CLEAR   Specific Gravity, Urine 1.010 1.005 - 1.030   pH 6.0 5.0 - 8.0   Glucose, UA NEGATIVE NEGATIVE mg/dL   Hgb urine dipstick NEGATIVE NEGATIVE   Bilirubin Urine NEGATIVE NEGATIVE   Ketones, ur NEGATIVE NEGATIVE mg/dL   Protein, ur NEGATIVE NEGATIVE mg/dL   Nitrite NEGATIVE NEGATIVE   Leukocytes,Ua NEGATIVE NEGATIVE  Sedimentation rate     Status: Abnormal   Collection Time: 04/20/21  6:15 PM  Result Value Ref Range    Sed Rate 32 (H) 0 - 22 mm/hr  CK     Status: None   Collection Time: 04/20/21  6:15 PM  Result Value Ref Range   Total CK 113 38 - 234 U/L  Resp Panel by RT-PCR (Flu A&B, Covid)     Status: None   Collection Time: 04/20/21  6:15 PM   Specimen: Nasopharyngeal(NP) swabs in vial transport medium  Result Value Ref Range   SARS Coronavirus 2 by RT PCR NEGATIVE NEGATIVE   Influenza A by PCR NEGATIVE NEGATIVE  Influenza B by PCR NEGATIVE NEGATIVE  Type and screen [place]     Status: None   Collection Time: 04/20/21  6:39 PM  Result Value Ref Range   ABO/RH(D) B POS    Antibody Screen NEG    Sample Expiration      04/23/2021,2359 Performed at [facility], [place] [address] [place], [place] [address]      Imaging Orders         CT ABDOMEN PELVIS W CONTRAST      Assessment and Plan   Kimberly Welch is an 70 y.o. female with appendicitis.  I recommended laparoscopic appendectomy.  The procedure itself as well as its risk, benefits, and alternatives will be discussed with the patient.  We will add her onto the schedule for Dr. Kae Heller who takes over the general surgery service in the morning.    ICD-10-CM   1. Acute appendicitis, unspecified acute appendicitis type  K35.80     2. Right lower quadrant abdominal pain  R10.31     3. Hepatic steatosis  K76.0        Felicie Morn, [facility] Use [email or web address] to contact on call provider

## 2021-04-20 NOTE — Progress Notes (Signed)
Pharmacy Antibiotic Note  Kimberly Welch is a 70 y.o. female admitted on 04/20/2021 with  intra-abdominal infection .  Pharmacy has been consulted for Zosyn dosing. WBC 10.3, SCr wnl   Plan: -Zosyn 3.375 gm IV Q 8 hours  -Monitor CBC, renal fx, cultures and clinical progress   Height: '4\' 10"'$  (147.3 cm) Weight: 69.4 kg (153 lb) IBW/kg (Calculated) : 40.9  Temp (24hrs), Avg:97.9 F (36.6 C), Min:97.6 F (36.4 C), Max:98.1 F (36.7 C)  Recent Labs  Lab 04/20/21 1738  WBC 10.3  CREATININE 0.82    Estimated Creatinine Clearance: 52.7 mL/min (by C-G formula based on SCr of 0.82 mg/dL).    Allergies  Allergen Reactions   Sulfa Antibiotics Swelling     Thank you for allowing pharmacy to be a part of this patient's care.  Albertina Parr, PharmD., BCPS, BCCCP Clinical Pharmacist Please refer to [facility] for unit-specific pharmacist

## 2021-04-20 NOTE — ED Triage Notes (Signed)
Pt reports having CT abdomen yesterday and was notified she has an enlarged appendix, and is awaiting referral by PCP to a surgeon.  Pt states had been having minimal low abd pain, but since last night pain is getting worse across low abd.  Denies fevers, n/v.

## 2021-04-20 NOTE — ED Triage Notes (Signed)
Pt c/o increased abdominal pain. States she had an outpatient CT that showed an enlarged appendix. Denies nausea/vomiting.

## 2021-04-21 ENCOUNTER — Inpatient Hospital Stay (HOSPITAL_COMMUNITY): Payer: Medicare Other | Admitting: Anesthesiology

## 2021-04-21 ENCOUNTER — Encounter (HOSPITAL_COMMUNITY): Admission: EM | Disposition: A | Discharge status: Home / Self Care | Payer: Self-pay | Source: Home / Self Care

## 2021-04-21 ENCOUNTER — Encounter (HOSPITAL_COMMUNITY): Payer: Self-pay | Admitting: Internal Medicine

## 2021-04-21 HISTORY — PX: LAPAROSCOPIC APPENDECTOMY: SHX408

## 2021-04-21 LAB — CBC
HCT: 34.3 % — ABNORMAL LOW (ref 36.0–46.0)
Hemoglobin: 11.2 g/dL — ABNORMAL LOW (ref 12.0–15.0)
MCH: 28 pg (ref 26.0–34.0)
MCHC: 32.7 g/dL (ref 30.0–36.0)
MCV: 85.8 fL (ref 80.0–100.0)
Platelets: 295 10*3/uL (ref 150–400)
RBC: 4 MIL/uL (ref 3.87–5.11)
RDW: 13.7 % (ref 11.5–15.5)
WBC: 10.9 10*3/uL — ABNORMAL HIGH (ref 4.0–10.5)
nRBC: 0 % (ref 0.0–0.2)

## 2021-04-21 LAB — GLUCOSE, CAPILLARY
Glucose-Capillary: 100 mg/dL — ABNORMAL HIGH (ref 70–99)
Glucose-Capillary: 111 mg/dL — ABNORMAL HIGH (ref 70–99)
Glucose-Capillary: 129 mg/dL — ABNORMAL HIGH (ref 70–99)
Glucose-Capillary: 134 mg/dL — ABNORMAL HIGH (ref 70–99)
Glucose-Capillary: 138 mg/dL — ABNORMAL HIGH (ref 70–99)
Glucose-Capillary: 157 mg/dL — ABNORMAL HIGH (ref 70–99)
Glucose-Capillary: 184 mg/dL — ABNORMAL HIGH (ref 70–99)

## 2021-04-21 LAB — BASIC METABOLIC PANEL
Anion gap: 11 (ref 5–15)
BUN: 8 mg/dL (ref 8–23)
CO2: 26 mmol/L (ref 22–32)
Calcium: 9.5 mg/dL (ref 8.9–10.3)
Chloride: 99 mmol/L (ref 98–111)
Creatinine, Ser: 0.92 mg/dL (ref 0.44–1.00)
GFR, Estimated: >60 mL/min (ref >60)
Glucose, Bld: 111 mg/dL — ABNORMAL HIGH (ref 70–99)
Potassium: 3.7 mmol/L (ref 3.5–5.1)
Sodium: 136 mmol/L (ref 135–145)

## 2021-04-21 LAB — SURGICAL PCR SCREEN
MRSA, PCR: NEGATIVE
Staphylococcus aureus: NEGATIVE

## 2021-04-21 LAB — HIV ANTIBODY (ROUTINE TESTING W REFLEX): HIV Screen 4th Generation wRfx: NONREACTIVE

## 2021-04-21 SURGERY — APPENDECTOMY, LAPAROSCOPIC
Anesthesia: General

## 2021-04-21 MED ORDER — FENTANYL CITRATE (PF) 250 MCG/5ML IJ SOLN
INTRAMUSCULAR | Status: DC | PRN
  Administered 2021-04-21: 100 ug via INTRAVENOUS
  Administered 2021-04-21: 50 ug via INTRAVENOUS

## 2021-04-21 MED ORDER — TRAMADOL HCL 50 MG PO TABS
50.0000 mg | ORAL_TABLET | Freq: Four times a day (QID) | ORAL | Status: DC | PRN
  Administered 2021-04-22 (×2): 50 mg via ORAL
  Filled 2021-04-21 (×2): qty 1

## 2021-04-21 MED ORDER — MIDAZOLAM HCL 2 MG/2ML IJ SOLN
INTRAMUSCULAR | Status: DC | PRN
  Administered 2021-04-21: 1 mg via INTRAVENOUS

## 2021-04-21 MED ORDER — SUGAMMADEX SODIUM 200 MG/2ML IV SOLN
INTRAVENOUS | Status: DC | PRN
  Administered 2021-04-21: 200 mg via INTRAVENOUS

## 2021-04-21 MED ORDER — BISACODYL 10 MG RE SUPP: 10.0000 mg | Freq: Every day | RECTAL | Status: DC | PRN

## 2021-04-21 MED ORDER — LIDOCAINE 2% (20 MG/ML) 5 ML SYRINGE
INTRAMUSCULAR | Status: DC | PRN
  Administered 2021-04-21: 60 mg via INTRAVENOUS

## 2021-04-21 MED ORDER — CHLORHEXIDINE GLUCONATE 0.12 % MT SOLN
OROMUCOSAL | Status: AC
  Filled 2021-04-21: qty 15

## 2021-04-21 MED ORDER — CHLORHEXIDINE GLUCONATE 0.12 % MT SOLN
15.0000 mL | Freq: Once | OROMUCOSAL | Status: AC
  Administered 2021-04-21: 15 mL via OROMUCOSAL

## 2021-04-21 MED ORDER — ONDANSETRON HCL 4 MG/2ML IJ SOLN
4.0000 mg | Freq: Four times a day (QID) | INTRAMUSCULAR | Status: DC | PRN
  Filled 2021-04-21 (×3): qty 2

## 2021-04-21 MED ORDER — ROCURONIUM BROMIDE 10 MG/ML (PF) SYRINGE
PREFILLED_SYRINGE | INTRAVENOUS | Status: DC | PRN
  Administered 2021-04-21: 40 mg via INTRAVENOUS

## 2021-04-21 MED ORDER — LACTATED RINGERS IV SOLN: INTRAVENOUS | Status: DC

## 2021-04-21 MED ORDER — DOCUSATE SODIUM 100 MG PO CAPS
100.0000 mg | ORAL_CAPSULE | Freq: Two times a day (BID) | ORAL | Status: DC
  Administered 2021-04-21 – 2021-04-24 (×5): 100 mg via ORAL
  Filled 2021-04-21 (×5): qty 1

## 2021-04-21 MED ORDER — ACETAMINOPHEN 325 MG PO TABS
650.0000 mg | ORAL_TABLET | Freq: Four times a day (QID) | ORAL | Status: DC
  Administered 2021-04-21 – 2021-04-24 (×8): 650 mg via ORAL
  Filled 2021-04-21 (×11): qty 2

## 2021-04-21 MED ORDER — ACETAMINOPHEN 10 MG/ML IV SOLN
INTRAVENOUS | Status: DC | PRN
  Administered 2021-04-21: 1000 mg via INTRAVENOUS

## 2021-04-21 MED ORDER — PHENYLEPHRINE HCL (PRESSORS) 10 MG/ML IV SOLN
INTRAVENOUS | Status: DC | PRN
Start: 2021-04-21 — End: 2021-04-21
  Administered 2021-04-21: 80 ug via INTRAVENOUS

## 2021-04-21 MED ORDER — HYDROMORPHONE HCL 1 MG/ML IJ SOLN: 0.5000 mg | INTRAMUSCULAR | Status: DC | PRN

## 2021-04-21 MED ORDER — BUPIVACAINE-EPINEPHRINE (PF) 0.25% -1:200000 IJ SOLN
INTRAMUSCULAR | Status: AC
  Filled 2021-04-21: qty 30

## 2021-04-21 MED ORDER — BUPIVACAINE-EPINEPHRINE 0.25% -1:200000 IJ SOLN
INTRAMUSCULAR | Status: DC | PRN
  Administered 2021-04-21: 20 mL

## 2021-04-21 MED ORDER — MEPERIDINE HCL 25 MG/ML IJ SOLN: 6.2500 mg | INTRAMUSCULAR | Status: DC | PRN

## 2021-04-21 MED ORDER — 0.9 % SODIUM CHLORIDE (POUR BTL) OPTIME
TOPICAL | Status: DC | PRN
  Administered 2021-04-21: 1000 mL

## 2021-04-21 MED ORDER — ACETAMINOPHEN 10 MG/ML IV SOLN
INTRAVENOUS | Status: AC
  Filled 2021-04-21: qty 100

## 2021-04-21 MED ORDER — DEXAMETHASONE SODIUM PHOSPHATE 10 MG/ML IJ SOLN
INTRAMUSCULAR | Status: DC | PRN
  Administered 2021-04-21: 10 mg via INTRAVENOUS

## 2021-04-21 MED ORDER — ONDANSETRON HCL 4 MG/2ML IJ SOLN
INTRAMUSCULAR | Status: DC | PRN
  Administered 2021-04-21: 4 mg via INTRAVENOUS

## 2021-04-21 MED ORDER — MIDAZOLAM HCL 2 MG/2ML IJ SOLN
INTRAMUSCULAR | Status: AC
  Filled 2021-04-21: qty 2

## 2021-04-21 MED ORDER — SODIUM CHLORIDE 0.9 % IR SOLN
Status: DC | PRN
  Administered 2021-04-21: 1000 mL

## 2021-04-21 MED ORDER — PROPOFOL 10 MG/ML IV BOLUS
INTRAVENOUS | Status: DC | PRN
  Administered 2021-04-21: 120 mg via INTRAVENOUS

## 2021-04-21 MED ORDER — PROMETHAZINE HCL 25 MG/ML IJ SOLN: 6.2500 mg | INTRAMUSCULAR | Status: DC | PRN

## 2021-04-21 MED ORDER — FENTANYL CITRATE (PF) 250 MCG/5ML IJ SOLN
INTRAMUSCULAR | Status: AC
  Filled 2021-04-21: qty 5

## 2021-04-21 MED ORDER — PROPOFOL 10 MG/ML IV BOLUS
INTRAVENOUS | Status: AC
  Filled 2021-04-21: qty 20

## 2021-04-21 MED ORDER — HYDROMORPHONE HCL 1 MG/ML IJ SOLN: 0.2500 mg | INTRAMUSCULAR | Status: DC | PRN

## 2021-04-21 SURGICAL SUPPLY — 39 items
APPLIER CLIP 5 13 M/L LIGAMAX5 (MISCELLANEOUS)
BAG COUNTER SPONGE SURGICOUNT (BAG) ×2 IMPLANT
BLADE CLIPPER SURG (BLADE) IMPLANT
CANISTER SUCT 3000ML PPV (MISCELLANEOUS) ×2 IMPLANT
CHLORAPREP W/TINT 26 (MISCELLANEOUS) ×2 IMPLANT
CLIP APPLIE 5 13 M/L LIGAMAX5 (MISCELLANEOUS) IMPLANT
COVER SURGICAL LIGHT HANDLE (MISCELLANEOUS) ×2 IMPLANT
CUTTER FLEX LINEAR 45M (STAPLE) ×2 IMPLANT
DERMABOND ADVANCED (GAUZE/BANDAGES/DRESSINGS) ×1
DERMABOND ADVANCED .7 DNX12 (GAUZE/BANDAGES/DRESSINGS) ×1 IMPLANT
ELECT REM PT RETURN 9FT ADLT (ELECTROSURGICAL) ×2
ELECTRODE REM PT RTRN 9FT ADLT (ELECTROSURGICAL) ×1 IMPLANT
GLOVE SURG ENC MOIS LTX SZ6 (GLOVE) ×2 IMPLANT
GLOVE SURG UNDER LTX SZ6.5 (GLOVE) ×2 IMPLANT
GOWN STRL REUS W/ TWL LRG LVL3 (GOWN DISPOSABLE) ×3 IMPLANT
GOWN STRL REUS W/TWL LRG LVL3 (GOWN DISPOSABLE) ×6
GRASPER SUT TROCAR 14GX15 (MISCELLANEOUS) ×2 IMPLANT
KIT BASIN OR (CUSTOM PROCEDURE TRAY) ×2 IMPLANT
KIT TURNOVER KIT B (KITS) ×2 IMPLANT
NEEDLE INSUFFLATION 14GA 120MM (NEEDLE) ×2 IMPLANT
NS IRRIG 1000ML POUR BTL (IV SOLUTION) ×2 IMPLANT
PAD ARMBOARD 7.5X6 YLW CONV (MISCELLANEOUS) ×4 IMPLANT
POUCH SPECIMEN RETRIEVAL 10MM (ENDOMECHANICALS) ×2 IMPLANT
RELOAD 45 VASCULAR/THIN (ENDOMECHANICALS) ×2 IMPLANT
RELOAD STAPLE TA45 3.5 REG BLU (ENDOMECHANICALS) IMPLANT
SCISSORS LAP 5X35 DISP (ENDOMECHANICALS) IMPLANT
SET IRRIG TUBING LAPAROSCOPIC (IRRIGATION / IRRIGATOR) ×2 IMPLANT
SET TUBE SMOKE EVAC HIGH FLOW (TUBING) ×2 IMPLANT
SHEARS HARMONIC ACE PLUS 36CM (ENDOMECHANICALS) ×2 IMPLANT
SLEEVE ENDOPATH XCEL 5M (ENDOMECHANICALS) ×2 IMPLANT
SPECIMEN JAR SMALL (MISCELLANEOUS) ×2 IMPLANT
SUT MNCRL AB 4-0 PS2 18 (SUTURE) ×2 IMPLANT
TOWEL GREEN STERILE FF (TOWEL DISPOSABLE) ×2 IMPLANT
TRAY FOLEY W/BAG SLVR 16FR (SET/KITS/TRAYS/PACK) ×2
TRAY FOLEY W/BAG SLVR 16FR ST (SET/KITS/TRAYS/PACK) ×1 IMPLANT
TRAY LAPAROSCOPIC MC (CUSTOM PROCEDURE TRAY) ×2 IMPLANT
TROCAR XCEL 12X100 BLDLESS (ENDOMECHANICALS) ×2 IMPLANT
TROCAR XCEL NON-BLD 5MMX100MML (ENDOMECHANICALS) ×2 IMPLANT
WATER STERILE IRR 1000ML POUR (IV SOLUTION) ×2 IMPLANT

## 2021-04-21 NOTE — Plan of Care (Signed)
  Problem: Education: Goal: Knowledge of General Education information will improve Description: Including pain rating scale, medication(s)/side effects and non-pharmacologic comfort measures 04/21/2021 0612 by Kelvin Cellar, RN Outcome: Progressing 04/21/2021 0612 by Kelvin Cellar, RN Outcome: Progressing 04/21/2021 0612 by Kelvin Cellar, RN Outcome: Progressing 04/21/2021 0611 by Kelvin Cellar, RN Outcome: Progressing   Problem: Pain Managment: Goal: General experience of comfort will improve 04/21/2021 0612 by Kelvin Cellar, RN Outcome: Progressing 04/21/2021 0612 by Kelvin Cellar, RN Outcome: Progressing 04/21/2021 0612 by Kelvin Cellar, RN Outcome: Progressing 04/21/2021 0611 by Kelvin Cellar, RN Outcome: Progressing

## 2021-04-21 NOTE — Op Note (Signed)
Operative Report  Kimberly Welch 70 y.o. female  [id number]  [id number]  04/21/2021  Surgeon: Romana Juniper MD FACS   Procedure performed: Laparoscopic Appendectomy  Procedure status: Emergent  Preop diagnosis: Subacute appendicitis   Post-op diagnosis/intraop findings: Subacute appendicitis - with peritoneal abscess   Specimens: appendix   EBL: minimal   Complications: none   Description of procedure: After obtaining informed consent the patient was brought to the operating room. Antibiotics had been administered. SCD's were applied. General endotracheal anesthesia was initiated and a formal time-out was performed.  Foley catheter inserted which is removed at the end of the case. The abdomen was prepped and draped in the usual sterile fashion and the abdomen was entered using an infraumbilical Veress needle and insufflated to 15 mmHg. A 5 mm trocar and camera were then introduced, the abdomen was inspected and there is no evidence of injury from our entry. A suprapubic 5 mm trocar and a left lower quadrant 12 mm trocar were introduced under direct visualization following infiltration with local. The patient was then placed in Trendelenburg and rotated to the left and the small bowel was reflected cephalad. The appendix was visualized: The distal end is adherent with pus, abscess and purulent inflammation to the anterior abdominal wall in the right lower quadrant. The proximal appendix appears healthy and viable.  With blunt dissection a window in the mesoappendix was created at the base and then a white load 45 mm Endo GIA stapler was used to transect the appendix from the cecum.  The mesoappendix was then divided with the harmonic scalpel. The appendix was then gently bluntly separated from its adhesion to the anterior abdominal wall, which necessitated expressing purulent fluid from the localized abscess in this area. Once freed the appendix was placed in an Endo Catch bag and removed through  the 55m trocar site.  The right lower quadrant was irrigated and aspirated, the effluent was clear.  A small amount of oozing on the abdominal wall was controlled with direct pressure.  The staple line was reinspected and confirmed to be viable, intact and hemostatic.  Omentum was brought back down to cover this.  The small bowel was run several feet proximally from the ileocecal valve and no other abnormalities were identified.  There are no lesions on the visible peritoneal surface or liver.  Hemostasis was ensured. The 142mtrocar site in the left lower quadrant was closed with a 0 vicryl in the fascia under direct visualization using the Endo Close. The abdomen was desufflated and all trocars removed. The skin incisions were closed with subcuticular 4-0 monocryl and Dermabond. The patient was awakened, extubated and transported to the recovery room in stable condition.    All counts were correct at the completion of the case.

## 2021-04-21 NOTE — Progress Notes (Addendum)
Patient returned from procedure, alert and orientedx4, easily fall back asleep. VSS. Patient denied any pain but c/o nausea. Zofran given, Surgical site CDI. Will continue to monitor.

## 2021-04-21 NOTE — Progress Notes (Signed)
[facility]  PROGRESS NOTE  Kimberly Welch [id number] DOB: 08/27/1950 DOA: 04/20/2021 PCP: Hoyt Koch, MD   Brief HPI:   70 year old female with medical history of diabetes mellitus type 2, hypertension, hyperlipidemia presented with right lower quadrant abdominal pain for 5 days.  CT abdomen/pelvis on 9/12 showed normal-appearing appendix, persistent symptoms. CT abdomen pelvis yesterday showed development of appendicitis, unruptured, cannot rule out neoplasm.  General surgery was consulted    Subjective   Patient seen and examined, underwent laparoscopic appendectomy.  Pain fairly well controlled   Assessment/Plan:     Acute appendicitis, s/p laparoscopic appendectomy -General surgery following -Continue Dilaudid as needed for pain -Continue IV Zosyn -Surgery planning to send home tomorrow on p.o. antibiotics for 5 days  Diabetes mellitus type 2 -Metformin on hold -Continue sliding scale insulin with NovoLog -CBG well controlled  Hypertension -Holding HCTZ/lisinopril -Blood pressure is stable  Hyperlipidemia -Patient takes statin at home -Currently on hold     Data Reviewed:   CBG:  Recent Labs  Lab 04/21/21 0422 04/21/21 0812 04/21/21 1024 04/21/21 1127 04/21/21 1604  GLUCAP 129* 100* 111* 134* 184*    SpO2: 96 %    Vitals:   04/21/21 1024 04/21/21 1039 04/21/21 1106 04/21/21 1559  BP: 128/68 128/66 122/60 137/76  Pulse: 94 78 78 96  Resp: '14 13 16 16  '$ Temp: 97.6 F (36.4 C) 97.6 F (36.4 C) 97.6 F (36.4 C) 97.7 F (36.5 C)  TempSrc:   Oral Oral  SpO2: 93% 92% 98% 96%  Weight:      Height:         Intake/Output Summary (Last 24 hours) at 04/21/2021 1605 Last data filed at 04/21/2021 1500 Gross per 24 hour  Intake 1190 ml  Output 75 ml  Net 1115 ml    09/16 1901 - 09/18 0700 In: 500  Out: -   Filed Weights   04/20/21 1843 04/21/21 0422  Weight: 69.4 kg 68.6 kg    Data Reviewed: Basic Metabolic  Panel: Recent Labs  Lab 04/20/21 1738 04/21/21 0429  NA 135 136  K 3.9 3.7  CL 97* 99  CO2 27 26  GLUCOSE 113* 111*  BUN 10 8  CREATININE 0.82 0.92  CALCIUM 9.5 9.5   Liver Function Tests: Recent Labs  Lab 04/20/21 1738  AST 20  ALT 13  ALKPHOS 85  BILITOT 0.7  PROT 7.3  ALBUMIN 4.1   Recent Labs  Lab 04/20/21 1738  LIPASE 30   No results for input(s): AMMONIA in the last 168 hours. CBC: Recent Labs  Lab 04/20/21 1738 04/21/21 0429  WBC 10.3 10.9*  HGB 12.0 11.2*  HCT 36.9 34.3*  MCV 86.6 85.8  PLT 298 295   Cardiac Enzymes: Recent Labs  Lab 04/20/21 1815  CKTOTAL 113   BNP (last 3 results) No results for input(s): BNP in the last 8760 hours.  ProBNP (last 3 results) No results for input(s): PROBNP in the last 8760 hours.  CBG: Recent Labs  Lab 04/21/21 0422 04/21/21 0812 04/21/21 1024 04/21/21 1127 04/21/21 1604  GLUCAP 129* 100* 111* 134* 184*    Recent Results (from the past 240 hour(s))  Resp Panel by RT-PCR (Flu A&B, Covid)     Status: None   Collection Time: 04/20/21  6:15 PM   Specimen: Nasopharyngeal(NP) swabs in vial transport medium  Result Value Ref Range Status   SARS Coronavirus 2 by RT PCR NEGATIVE NEGATIVE Final    Comment: (NOTE) SARS-CoV-2 target nucleic  acids are NOT DETECTED.  The SARS-CoV-2 RNA is generally detectable in upper respiratory specimens during the acute phase of infection. The lowest concentration of SARS-CoV-2 viral copies this assay can detect is 138 copies/mL. A negative result does not preclude SARS-Cov-2 infection and should not be used as the sole basis for treatment or other patient management decisions. A negative result may occur with  improper specimen collection/handling, submission of specimen other than nasopharyngeal swab, presence of viral mutation(s) within the areas targeted by this assay, and inadequate number of viral copies(<138 copies/mL). A negative result must be combined  with clinical observations, patient history, and epidemiological information. The expected result is Negative.  Fact Sheet for Patients:  EntrepreneurPulse.com.au  Fact Sheet for Healthcare Providers:  IncredibleEmployment.be  This test is no t yet approved or cleared by the [place] FDA and  has been authorized for detection and/or diagnosis of SARS-CoV-2 by FDA under an Emergency Use Authorization (EUA). This EUA will remain  in effect (meaning this test can be used) for the duration of the COVID-19 declaration under Section 564(b)(1) of the Act, 21 U.S.C.section 360bbb-3(b)(1), unless the authorization is terminated  or revoked sooner.       Influenza A by PCR NEGATIVE NEGATIVE Final   Influenza B by PCR NEGATIVE NEGATIVE Final    Comment: (NOTE) The Xpert Xpress SARS-CoV-2/FLU/RSV plus assay is intended as an aid in the diagnosis of influenza from Nasopharyngeal swab specimens and should not be used as a sole basis for treatment. Nasal washings and aspirates are unacceptable for Xpert Xpress SARS-CoV-2/FLU/RSV testing.  Fact Sheet for Patients: EntrepreneurPulse.com.au  Fact Sheet for Healthcare Providers: IncredibleEmployment.be  This test is not yet approved or cleared by the [place] FDA and has been authorized for detection and/or diagnosis of SARS-CoV-2 by FDA under an Emergency Use Authorization (EUA). This EUA will remain in effect (meaning this test can be used) for the duration of the COVID-19 declaration under Section 564(b)(1) of the Act, 21 U.S.C. section 360bbb-3(b)(1), unless the authorization is terminated or revoked.  Performed at [facility], [place] [address] [place], [place] [address]   Blood culture (routine x 2)     Status: None (Preliminary result)   Collection Time: 04/20/21  9:41 PM   Specimen: Site Not Specified; Blood  Result Value Ref Range Status    Specimen Description SITE NOT SPECIFIED  Final   Special Requests   Final    BOTTLES DRAWN AEROBIC AND ANAEROBIC Blood Culture adequate volume   Culture   Final    NO GROWTH < 12 HOURS Performed at [facility], [place] [address] [place], [place] [address]    Report Status PENDING  Incomplete  Blood culture (routine x 2)     Status: None (Preliminary result)   Collection Time: 04/20/21  9:48 PM   Specimen: BLOOD  Result Value Ref Range Status   Specimen Description BLOOD RIGHT ANTECUBITAL  Final   Special Requests   Final    BOTTLES DRAWN AEROBIC AND ANAEROBIC Blood Culture adequate volume   Culture   Final    NO GROWTH < 12 HOURS Performed at [facility], [place] [address], [place], [place] [address]    Report Status PENDING  Incomplete  Surgical PCR screen     Status: None   Collection Time: 04/21/21  5:20 AM   Specimen: Nasal Mucosa; Nasal Swab  Result Value Ref Range Status   MRSA, PCR NEGATIVE NEGATIVE Final  Staphylococcus aureus NEGATIVE NEGATIVE Final    Comment: (NOTE) The Xpert SA Assay (FDA approved for NASAL specimens in patients 17 years of age and older), is one component of a comprehensive surveillance program. It is not intended to diagnose infection nor to guide or monitor treatment. Performed at [facility], [place] [address] [place], [place] [address]         Scheduled medications:    acetaminophen  650 mg Oral Q6H   chlorhexidine       insulin aspart  0-9 Units Subcutaneous Q4H    Antibiotics: Anti-infectives (From admission, onward)    Start     Dose/Rate Route Frequency Ordered Stop   04/21/21 0500  piperacillin-tazobactam (ZOSYN) IVPB 3.375 g        3.375 g 12.5 mL/hr over 240 Minutes Intravenous Every 8 hours 04/20/21 2104     04/20/21 2115  piperacillin-tazobactam (ZOSYN) IVPB 3.375 g        3.375 g 100 mL/hr over 30 Minutes Intravenous  Once 04/20/21 2104 04/20/21 2340        Radiology Reports  CT ABDOMEN PELVIS W  CONTRAST  Result Date: 04/20/2021 CLINICAL DATA:  Right lower quadrant pain.  Worsened EXAM: CT ABDOMEN AND PELVIS WITH CONTRAST TECHNIQUE: Multidetector CT imaging of the abdomen and pelvis was performed using the standard protocol following bolus administration of intravenous contrast. CONTRAST:  156m OMNIPAQUE IOHEXOL 350 MG/ML SOLN COMPARISON:  CT abdomen pelvis 04/15/2021 FINDINGS: Lower chest: No acute abnormality. Hepatobiliary: The hepatic parenchyma is diffusely hypodense compared to the splenic parenchyma consistent with fatty infiltration. No focal liver abnormality. The gallbladder is not visualized and is likely surgically absent. No biliary dilatation. Pancreas: No focal lesion. Normal pancreatic contour. No surrounding inflammatory changes. No main pancreatic ductal dilatation. Spleen: Normal in size without focal abnormality. Adrenals/Urinary Tract: No adrenal nodule bilaterally. Bilateral kidneys enhance symmetrically. No hydronephrosis. No hydroureter. The urinary bladder is unremarkable. Stomach/Bowel: Stomach is within normal limits. No evidence of bowel wall thickening or dilatation. Scattered colonic diverticulosis. The tip of the appendix is again noted to be enlarged in caliber measuring up to 23 mm. Interval development of periappendiceal fat stranding and appendiceal wall thickening. No definite appendiceal wall discontinuity. The appendix is noted to course posteroinferior to the cecum. Vascular/Lymphatic: No abdominal aorta or iliac aneurysm. Mild atherosclerotic plaque of the aorta and its branches. No abdominal, pelvic, or inguinal lymphadenopathy. Reproductive: There is a 5.5 cm heterogeneous slightly hypodense lesion within the posterior wall of the uterus. Subcentimeter calcified lesions are also noted within the uterine wall. Otherwise the uterus and bilateral adnexa are unremarkable. Other: No intraperitoneal free fluid. No intraperitoneal free gas. No organized fluid  collection. Musculoskeletal: No abdominal wall hernia or abnormality. No suspicious lytic or blastic osseous lesions. No acute displaced fracture. IMPRESSION: 1. Interval development of non-perforated acute tip appendicitis. Underlying mucocele or malignancy is not excluded. Recommend surgical consultation. 2. Scattered colonic diverticulosis with no acute diverticulitis. 3. Hepatic steatosis. 4. Uterine fibroids with the largest measuring up to 5.5 cm. 5.  Aortic Atherosclerosis (ICD10-I70.0). Electronically Signed   By: MIven FinnM.D.   On: 04/20/2021 19:53      DVT prophylaxis: SCDs  Code Status: Full code  Family Communication: No family at bedside   Consultants: General surgery  Procedures: Laparoscopic appendectomy    Objective    Physical Examination:   General-appears in no acute distress Heart-S1-S2, regular, no murmur auscultated Lungs-clear to auscultation bilaterally, no wheezing or crackles auscultated  Abdomen-soft, mild generalized tenderness, abdomen is distended Extremities-no edema in the lower extremities Neuro-alert, oriented x3, no focal deficit noted   Status is: Inpatient  Dispo: The patient is from: Home              Anticipated d/c is to: Home              Anticipated d/c date is: 04/22/2021              Patient currently not stable for discharge  Barrier to discharge-pain management after laparoscopic appendectomy  COVID-19 Labs  No results for input(s): DDIMER, FERRITIN, LDH, CRP in the last 72 hours.  Lab Results  Component Value Date   [place] NEGATIVE 04/20/2021   [place] Not Detected 06/20/2019              Oswald Hillock   [facility] If 7PM-7AM, please contact night-coverage at [email or web address], Office  [phone]   04/21/2021, 4:05 PM  LOS: 1 day

## 2021-04-21 NOTE — Progress Notes (Signed)
Admitted pt from ED for Lap Appendectomy today. V/S, weight and BSL checked. Oriented to room and call bell use. Assisted to BR twice to pass urine. CHG bath given, changed gown. Mouth care done independently by pt. Admission completed, IV fluid continued, due meds given, kept NPO, pt aware. Pt's pain is at 3/10.

## 2021-04-21 NOTE — Progress Notes (Signed)
Patient reassessed this afternoon.  Reports pain is well controlled, some nausea initially postop but now resolved.  Now tolerating p.o.  Preoperative pain is significantly better.  She is requesting stool softener or suppository. Afebrile, heart rate 78, normotensive, respirations 16, 98% on room air Abdomen is soft, appropriately tender  Continue IV antibiotics while in the hospital, will plan to discharge tomorrow on p.o. antibiotics x 5days given small abscess.

## 2021-04-21 NOTE — Transfer of Care (Signed)
Immediate Anesthesia Transfer of Care Note  Patient: Kimberly Welch  Procedure(s) Performed: APPENDECTOMY LAPAROSCOPIC  Patient Location: PACU  Anesthesia Type:General  Level of Consciousness: drowsy  Airway & Oxygen Therapy: Patient Spontanous Breathing  Post-op Assessment: Report given to RN and Post -op Vital signs reviewed and stable  Post vital signs: Reviewed and stable  Last Vitals:  Vitals Value Taken Time  BP 128/68 04/21/21 1024  Temp 36.4 C 04/21/21 1024  Pulse 87 04/21/21 1026  Resp 17 04/21/21 1026  SpO2 92 % 04/21/21 1026  Vitals shown include unvalidated device data.  Last Pain:  Vitals:   04/21/21 0828  TempSrc: Oral  PainSc:          Complications: No notable events documented.

## 2021-04-21 NOTE — Anesthesia Preprocedure Evaluation (Signed)
Anesthesia Evaluation  Patient identified by MRN, date of birth, ID band Patient awake    Reviewed: Allergy & Precautions, NPO status , Patient's Chart, lab work & pertinent test results  Airway Mallampati: II  TM Distance: >3 FB Neck ROM: Full    Dental  (+) Dental Advisory Given, Partial Lower, Partial Upper, Poor Dentition, Missing   Pulmonary neg pulmonary ROS,    Pulmonary exam normal breath sounds clear to auscultation       Cardiovascular hypertension, Pt. on medications Normal cardiovascular exam Rhythm:Regular Rate:Normal     Neuro/Psych negative neurological ROS     GI/Hepatic Neg liver ROS, GERD  ,  Endo/Other  negative endocrine ROSdiabetes  Renal/GU negative Renal ROS     Musculoskeletal negative musculoskeletal ROS (+)   Abdominal   Peds  Hematology negative hematology ROS (+)   Anesthesia Other Findings   Reproductive/Obstetrics                            Anesthesia Physical Anesthesia Plan  ASA: 2  Anesthesia Plan: General   Post-op Pain Management:    Induction: Intravenous  PONV Risk Score and Plan: 4 or greater and Ondansetron, Dexamethasone, Treatment may vary due to age or medical condition, Midazolam and Diphenhydramine  Airway Management Planned: Oral ETT  Additional Equipment: None  Intra-op Plan:   Post-operative Plan: Extubation in OR  Informed Consent: I have reviewed the patients History and Physical, chart, labs and discussed the procedure including the risks, benefits and alternatives for the proposed anesthesia with the patient or authorized representative who has indicated his/her understanding and acceptance.     Dental advisory given  Plan Discussed with: CRNA  Anesthesia Plan Comments:        Anesthesia Quick Evaluation

## 2021-04-21 NOTE — Anesthesia Postprocedure Evaluation (Signed)
Anesthesia Post Note  Patient: Kimberly Welch  Procedure(s) Performed: APPENDECTOMY LAPAROSCOPIC     Patient location during evaluation: PACU Anesthesia Type: General Level of consciousness: sedated and patient cooperative Pain management: pain level controlled Vital Signs Assessment: post-procedure vital signs reviewed and stable Respiratory status: spontaneous breathing Cardiovascular status: stable Anesthetic complications: no   No notable events documented.  Last Vitals:  Vitals:   04/21/21 1039 04/21/21 1106  BP: 128/66 122/60  Pulse: 78 78  Resp: 13 16  Temp: 36.4 C 36.4 C  SpO2: 92% 98%    Last Pain:  Vitals:   04/21/21 1106  TempSrc: Oral  PainSc: 0-No pain                 Nolon Nations

## 2021-04-21 NOTE — Interval H&P Note (Signed)
History and Physical Interval Note:  04/21/2021 7:47 AM  Kimberly Welch  has presented today for surgery, with the diagnosis of appendicitis.  The various methods of treatment have been discussed with the patient and family. After consideration of risks, benefits and other options for treatment, the patient has consented to  Procedure(s): APPENDECTOMY LAPAROSCOPIC (N/A) as a surgical intervention.  The patient's history has been reviewed, patient examined, no change in status, stable for surgery.  I have reviewed the patient's chart and labs.  Questions were answered to the patient's satisfaction.     [name]

## 2021-04-21 NOTE — Anesthesia Procedure Notes (Signed)
Procedure Name: Intubation Date/Time: 04/21/2021 9:17 AM Performed by: Clearnce Sorrel, CRNA Pre-anesthesia Checklist: Patient identified, Emergency Drugs available, Suction available and Patient being monitored Patient Re-evaluated:Patient Re-evaluated prior to induction Oxygen Delivery Method: Circle System Utilized Preoxygenation: Pre-oxygenation with 100% oxygen Induction Type: IV induction Ventilation: Mask ventilation without difficulty Laryngoscope Size: Mac and 3 Tube type: Oral Tube size: 7.0 mm Number of attempts: 1 Airway Equipment and Method: Stylet and Oral airway Placement Confirmation: ETT inserted through vocal cords under direct vision, positive ETCO2 and breath sounds checked- equal and bilateral Secured at: 21 cm Tube secured with: Tape Dental Injury: Teeth and Oropharynx as per pre-operative assessment

## 2021-04-22 ENCOUNTER — Encounter (HOSPITAL_COMMUNITY): Payer: Self-pay | Admitting: Surgery

## 2021-04-22 LAB — BASIC METABOLIC PANEL
Anion gap: 9 (ref 5–15)
BUN: 8 mg/dL (ref 8–23)
CO2: 27 mmol/L (ref 22–32)
Calcium: 9.4 mg/dL (ref 8.9–10.3)
Chloride: 103 mmol/L (ref 98–111)
Creatinine, Ser: 0.93 mg/dL (ref 0.44–1.00)
GFR, Estimated: >60 mL/min (ref >60)
Glucose, Bld: 100 mg/dL — ABNORMAL HIGH (ref 70–99)
Potassium: 3.7 mmol/L (ref 3.5–5.1)
Sodium: 139 mmol/L (ref 135–145)

## 2021-04-22 LAB — GLUCOSE, CAPILLARY
Glucose-Capillary: 138 mg/dL — ABNORMAL HIGH (ref 70–99)
Glucose-Capillary: 119 mg/dL — ABNORMAL HIGH (ref 70–99)
Glucose-Capillary: 146 mg/dL — ABNORMAL HIGH (ref 70–99)
Glucose-Capillary: 120 mg/dL — ABNORMAL HIGH (ref 70–99)
Glucose-Capillary: 126 mg/dL — ABNORMAL HIGH (ref 70–99)

## 2021-04-22 LAB — CBC
HCT: 33.4 % — ABNORMAL LOW (ref 36.0–46.0)
Hemoglobin: 10.8 g/dL — ABNORMAL LOW (ref 12.0–15.0)
MCH: 28 pg (ref 26.0–34.0)
MCHC: 32.3 g/dL (ref 30.0–36.0)
MCV: 86.5 fL (ref 80.0–100.0)
Platelets: 283 10*3/uL (ref 150–400)
RBC: 3.86 MIL/uL — ABNORMAL LOW (ref 3.87–5.11)
RDW: 13.9 % (ref 11.5–15.5)
WBC: 12.6 10*3/uL — ABNORMAL HIGH (ref 4.0–10.5)
nRBC: 0 % (ref 0.0–0.2)

## 2021-04-22 MED ORDER — SODIUM CHLORIDE 0.9 % IV SOLN
12.5000 mg | Freq: Four times a day (QID) | INTRAVENOUS | Status: DC | PRN
  Filled 2021-04-22 (×2): qty 0.5

## 2021-04-22 MED ORDER — AMOXICILLIN-POT CLAVULANATE 875-125 MG PO TABS: 1.0000 | ORAL_TABLET | Freq: Two times a day (BID) | ORAL | 0 refills | Status: AC

## 2021-04-22 MED ORDER — ENOXAPARIN SODIUM 40 MG/0.4ML IJ SOSY
40.0000 mg | PREFILLED_SYRINGE | INTRAMUSCULAR | Status: DC
  Administered 2021-04-22 – 2021-04-24 (×3): 40 mg via SUBCUTANEOUS
  Filled 2021-04-22 (×3): qty 0.4

## 2021-04-22 MED ORDER — ONDANSETRON HCL 4 MG/2ML IJ SOLN
4.0000 mg | INTRAMUSCULAR | Status: DC | PRN
  Administered 2021-04-22: 4 mg via INTRAVENOUS
  Filled 2021-04-22: qty 2

## 2021-04-22 MED ORDER — POLYETHYLENE GLYCOL 3350 17 G PO PACK
17.0000 g | PACK | Freq: Every day | ORAL | Status: DC
  Administered 2021-04-22 – 2021-04-23 (×2): 17 g via ORAL
  Filled 2021-04-22 (×2): qty 1

## 2021-04-22 MED ORDER — TRAMADOL HCL 50 MG PO TABS: 50.0000 mg | ORAL_TABLET | Freq: Four times a day (QID) | ORAL | 0 refills | Status: DC | PRN

## 2021-04-22 NOTE — Telephone Encounter (Signed)
Noted  

## 2021-04-22 NOTE — Telephone Encounter (Signed)
[facility] FYI.Marland KitchenMarland Kitchen   Lower right side of abd pain. Pressure in bottom of stomach when she urinates. No fever. Pain started yesterday. Rates as 3/10.   Advised to go to ED now. Patient understood and decided to go to urgent care.

## 2021-04-22 NOTE — Discharge Instructions (Signed)
[facility]  Please arrive at least 30 min before your appointment to complete your check in paperwork.  If you are unable to arrive 30 min prior to your appointment time we may have to cancel or reschedule you. LAPAROSCOPIC SURGERY: POST OP INSTRUCTIONS Always review your discharge instruction sheet given to you by the facility where your surgery was performed. IF YOU HAVE DISABILITY OR FAMILY LEAVE FORMS, YOU MUST BRING THEM TO THE OFFICE FOR PROCESSING.   DO NOT GIVE THEM TO YOUR DOCTOR.  PAIN CONTROL  First take acetaminophen (Tylenol) AND/or ibuprofen (Advil) to control your pain after surgery.  Follow directions on package.  Taking acetaminophen (Tylenol) and/or ibuprofen (Advil) regularly after surgery will help to control your pain and lower the amount of prescription pain medication you may need.  You should not take more than 4,000 mg (4 grams) of acetaminophen (Tylenol) in 24 hours.  You should not take ibuprofen (Advil), aleve, motrin, naprosyn or other NSAIDS if you have a history of stomach ulcers or chronic kidney disease.  A prescription for pain medication may be given to you upon discharge.  Take your pain medication as prescribed, if you still have uncontrolled pain after taking acetaminophen (Tylenol) or ibuprofen (Advil). Use ice packs to help control pain. If you need a refill on your pain medication, please contact your pharmacy.  They will contact our office to request authorization. Prescriptions will not be filled after 5pm or on week-ends.  HOME MEDICATIONS Take your usually prescribed medications unless otherwise directed.  DIET You should follow a light diet the first few days after arrival home.  Be sure to include lots of fluids daily. Avoid fatty, fried foods.   CONSTIPATION It is common to experience some constipation after surgery and if you are taking pain medication.  Increasing fluid intake and taking a stool softener (such as Colace)  will usually help or prevent this problem from occurring.  A mild laxative (Milk of Magnesia or Miralax) should be taken according to package instructions if there are no bowel movements after 48 hours.  WOUND/INCISION CARE Most patients will experience some swelling and bruising in the area of the incisions.  Ice packs will help.  Swelling and bruising can take several days to resolve.  Unless discharge instructions indicate otherwise, follow guidelines below  STERI-STRIPS - you may remove your outer bandages 48 hours after surgery, and you may shower at that time.  You have steri-strips (small skin tapes) in place directly over the incision.  These strips should be left on the skin for 7-10 days.   DERMABOND/SKIN GLUE - you may shower in 24 hours.  The glue will flake off over the next 2-3 weeks. Any sutures or staples will be removed at the office during your follow-up visit.  ACTIVITIES You may resume regular (light) daily activities beginning the next day--such as daily self-care, walking, climbing stairs--gradually increasing activities as tolerated.  You may have sexual intercourse when it is comfortable.  Refrain from any heavy lifting or straining until approved by your doctor. You may drive when you are no longer taking prescription pain medication, you can comfortably wear a seatbelt, and you can safely maneuver your car and apply brakes.  FOLLOW-UP You should see your doctor in the office for a follow-up appointment approximately 2-3 weeks after your surgery.  You should have been given your post-op/follow-up appointment when your surgery was scheduled.  If you did not receive a post-op/follow-up appointment, make sure   that you call for this appointment within a day or two after you arrive home to insure a convenient appointment time.  OTHER INSTRUCTIONS  WHEN TO CALL YOUR DOCTOR: Fever over 101.0 Inability to urinate Continued bleeding from incision. Increased pain, redness, or  drainage from the incision. Increasing abdominal pain  The clinic staff is available to answer your questions during regular business hours.  Please don't hesitate to call and ask to speak to one of the nurses for clinical concerns.  If you have a medical emergency, go to the nearest emergency room or call 911.  A surgeon from [facility] is always on call at the hospital. [address], [address], [place], [place]  [address] ? [address] [address], [place], [place]   [address] [phone] ? [phone] ? FAX [phone]   

## 2021-04-22 NOTE — Progress Notes (Signed)
[facility] ca;;ed informed that patient is having nausea and vomiting the next dose of Zofran can be given at 2300. Will continue to monitor

## 2021-04-22 NOTE — Progress Notes (Signed)
[facility] Progress Note  1 Day Post-Op  Subjective: CC-  Up in chair. Feeling more bloated. Denies n/v. Passing some flatus, no BM.  Objective: Vital signs in last 24 hours: Temp:  [97.6 F (36.4 C)-99.6 F (37.6 C)] 98.4 F (36.9 C) (09/19 0355) Pulse Rate:  [74-96] 74 (09/19 0355) Resp:  [13-16] 16 (09/19 0355) BP: (114-137)/(60-81) 114/61 (09/19 0355) SpO2:  [92 %-98 %] 97 % (09/19 0355) Last BM Date: 04/20/21  Intake/Output from previous day: 09/18 0701 - 09/19 0700 In: 1449.4 [P.O.:820; I.V.:579.4; IV Piggyback:50] Out: 75 [Urine:50; Blood:25] Intake/Output this shift: No intake/output data recorded.  PE: Gen:  Alert, NAD, pleasant Pulm:  rate and effort normal Abd: distended but soft, appropriately tender, hypoactive BS, incisions cdi without erythema or drainage  Lab Results:  Recent Labs    04/21/21 0429 04/22/21 0633  WBC 10.9* 12.6*  HGB 11.2* 10.8*  HCT 34.3* 33.4*  PLT 295 283   BMET Recent Labs    04/21/21 0429 04/22/21 0633  NA 136 139  K 3.7 3.7  CL 99 103  CO2 26 27  GLUCOSE 111* 100*  BUN 8 8  CREATININE 0.92 0.93  CALCIUM 9.5 9.4   PT/INR No results for input(s): LABPROT, INR in the last 72 hours. CMP     Component Value Date/Time   NA 139 04/22/2021 0633   K 3.7 04/22/2021 0633   CL 103 04/22/2021 0633   CO2 27 04/22/2021 0633   GLUCOSE 100 (H) 04/22/2021 0633   BUN 8 04/22/2021 0633   CREATININE 0.93 04/22/2021 0633   CALCIUM 9.4 04/22/2021 0633   PROT 7.3 04/20/2021 1738   ALBUMIN 4.1 04/20/2021 1738   AST 20 04/20/2021 1738   ALT 13 04/20/2021 1738   ALKPHOS 85 04/20/2021 1738   BILITOT 0.7 04/20/2021 1738   GFRNONAA >60 04/22/2021 0633   GFRAA >60 09/22/2015 0059   Lipase     Component Value Date/Time   LIPASE 30 04/20/2021 1738       Studies/Results: CT ABDOMEN PELVIS W CONTRAST  Result Date: 04/20/2021 CLINICAL DATA:  Right lower quadrant pain.  Worsened EXAM: CT ABDOMEN AND PELVIS WITH  CONTRAST TECHNIQUE: Multidetector CT imaging of the abdomen and pelvis was performed using the standard protocol following bolus administration of intravenous contrast. CONTRAST:  144m OMNIPAQUE IOHEXOL 350 MG/ML SOLN COMPARISON:  CT abdomen pelvis 04/15/2021 FINDINGS: Lower chest: No acute abnormality. Hepatobiliary: The hepatic parenchyma is diffusely hypodense compared to the splenic parenchyma consistent with fatty infiltration. No focal liver abnormality. The gallbladder is not visualized and is likely surgically absent. No biliary dilatation. Pancreas: No focal lesion. Normal pancreatic contour. No surrounding inflammatory changes. No main pancreatic ductal dilatation. Spleen: Normal in size without focal abnormality. Adrenals/Urinary Tract: No adrenal nodule bilaterally. Bilateral kidneys enhance symmetrically. No hydronephrosis. No hydroureter. The urinary bladder is unremarkable. Stomach/Bowel: Stomach is within normal limits. No evidence of bowel wall thickening or dilatation. Scattered colonic diverticulosis. The tip of the appendix is again noted to be enlarged in caliber measuring up to 23 mm. Interval development of periappendiceal fat stranding and appendiceal wall thickening. No definite appendiceal wall discontinuity. The appendix is noted to course posteroinferior to the cecum. Vascular/Lymphatic: No abdominal aorta or iliac aneurysm. Mild atherosclerotic plaque of the aorta and its branches. No abdominal, pelvic, or inguinal lymphadenopathy. Reproductive: There is a 5.5 cm heterogeneous slightly hypodense lesion within the posterior wall of the uterus. Subcentimeter calcified lesions are also noted within the uterine wall.  Otherwise the uterus and bilateral adnexa are unremarkable. Other: No intraperitoneal free fluid. No intraperitoneal free gas. No organized fluid collection. Musculoskeletal: No abdominal wall hernia or abnormality. No suspicious lytic or blastic osseous lesions. No acute  displaced fracture. IMPRESSION: 1. Interval development of non-perforated acute tip appendicitis. Underlying mucocele or malignancy is not excluded. Recommend surgical consultation. 2. Scattered colonic diverticulosis with no acute diverticulitis. 3. Hepatic steatosis. 4. Uterine fibroids with the largest measuring up to 5.5 cm. 5.  Aortic Atherosclerosis (ICD10-I70.0). Electronically Signed   By: Iven Finn M.D.   On: 04/20/2021 19:53    Anti-infectives: Anti-infectives (From admission, onward)    Start     Dose/Rate Route Frequency Ordered Stop   04/21/21 0500  piperacillin-tazobactam (ZOSYN) IVPB 3.375 g        3.375 g 12.5 mL/hr over 240 Minutes Intravenous Every 8 hours 04/20/21 2104     04/20/21 2115  piperacillin-tazobactam (ZOSYN) IVPB 3.375 g        3.375 g 100 mL/hr over 30 Minutes Intravenous  Once 04/20/21 2104 04/20/21 2340        Assessment/Plan  POD#1 Subacute appendicitis - with peritoneal abscess s/p laparoscopic appendectomy 9/18 Dr. Kae Heller - More distended this morning, but no n/v and she is passing some flatus. Add miralax to colace. Mobilize more. Will recheck later today for possible discharge. Continue IV zosyn. Plan for 5 days oral abx at discharge.   ID - zosyn 9/17>> FEN - CM diet VTE - lovenox Foley - none Follow up - [facility]   LOS: 2 days    Wellington Hampshire, [facility] 04/22/2021, 8:35 AM Please see Amion for pager number during day hours 7:00am-4:30pm

## 2021-04-23 LAB — GLUCOSE, CAPILLARY
Glucose-Capillary: 100 mg/dL — ABNORMAL HIGH (ref 70–99)
Glucose-Capillary: 108 mg/dL — ABNORMAL HIGH (ref 70–99)
Glucose-Capillary: 116 mg/dL — ABNORMAL HIGH (ref 70–99)
Glucose-Capillary: 124 mg/dL — ABNORMAL HIGH (ref 70–99)
Glucose-Capillary: 140 mg/dL — ABNORMAL HIGH (ref 70–99)
Glucose-Capillary: 98 mg/dL (ref 70–99)
Glucose-Capillary: 99 mg/dL (ref 70–99)

## 2021-04-23 MED ORDER — BISACODYL 10 MG RE SUPP
10.0000 mg | Freq: Once | RECTAL | Status: AC
  Administered 2021-04-23: 10 mg via RECTAL
  Filled 2021-04-23: qty 1

## 2021-04-23 NOTE — Care Management Important Message (Signed)
Important Message  Patient Details  Name: Kimberly Welch MRN: [id number] Date of Birth: [birth year]   Medicare Important Message Given:  Yes     [name] Montine Circle 04/23/2021, 3:39 PM

## 2021-04-23 NOTE — Plan of Care (Signed)

## 2021-04-23 NOTE — Progress Notes (Signed)
Patient ID: Kimberly Welch, female   DOB: 04/17/1951, 70 y.o.   MRN: [id number]  [facility] Progress Note  2 Days Post-Op  Subjective: CC-  Patient vomited last night. Since then she feels like she has improved. Tolerated breakfast without n/v. Abdominal bloating improved. Continues to pass flatus, no BM.  Objective: Vital signs in last 24 hours: Temp:  [97.6 F (36.4 C)-98.2 F (36.8 C)] 98 F (36.7 C) (09/20 0527) Pulse Rate:  [70-88] 71 (09/20 0527) Resp:  [16-18] 18 (09/20 0527) BP: (116-141)/(66-79) 116/67 (09/20 0527) SpO2:  [95 %-98 %] 95 % (09/20 0527) Last BM Date: 04/20/21  Intake/Output from previous day: No intake/output data recorded. Intake/Output this shift: No intake/output data recorded.  PE: Gen:  Alert, NAD, pleasant Pulm:  rate and effort normal Abd: soft, mild distension, appropriately tender, incisions cdi without erythema or drainage   Lab Results:  Recent Labs    04/21/21 0429 04/22/21 0633  WBC 10.9* 12.6*  HGB 11.2* 10.8*  HCT 34.3* 33.4*  PLT 295 283   BMET Recent Labs    04/21/21 0429 04/22/21 0633  NA 136 139  K 3.7 3.7  CL 99 103  CO2 26 27  GLUCOSE 111* 100*  BUN 8 8  CREATININE 0.92 0.93  CALCIUM 9.5 9.4   PT/INR No results for input(s): LABPROT, INR in the last 72 hours. CMP     Component Value Date/Time   NA 139 04/22/2021 0633   K 3.7 04/22/2021 0633   CL 103 04/22/2021 0633   CO2 27 04/22/2021 0633   GLUCOSE 100 (H) 04/22/2021 0633   BUN 8 04/22/2021 0633   CREATININE 0.93 04/22/2021 0633   CALCIUM 9.4 04/22/2021 0633   PROT 7.3 04/20/2021 1738   ALBUMIN 4.1 04/20/2021 1738   AST 20 04/20/2021 1738   ALT 13 04/20/2021 1738   ALKPHOS 85 04/20/2021 1738   BILITOT 0.7 04/20/2021 1738   GFRNONAA >60 04/22/2021 0633   GFRAA >60 09/22/2015 0059   Lipase     Component Value Date/Time   LIPASE 30 04/20/2021 1738       Studies/Results: No results found.  Anti-infectives: Anti-infectives  (From admission, onward)    Start     Dose/Rate Route Frequency Ordered Stop   04/22/21 0000  amoxicillin-clavulanate (AUGMENTIN) 875-125 MG tablet        1 tablet Oral 2 times daily 04/22/21 0841 04/27/21 2359   04/21/21 0500  piperacillin-tazobactam (ZOSYN) IVPB 3.375 g        3.375 g 12.5 mL/hr over 240 Minutes Intravenous Every 8 hours 04/20/21 2104     04/20/21 2115  piperacillin-tazobactam (ZOSYN) IVPB 3.375 g        3.375 g 100 mL/hr over 30 Minutes Intravenous  Once 04/20/21 2104 04/20/21 2340        Assessment/Plan POD#2 Subacute appendicitis - with peritoneal abscess s/p laparoscopic appendectomy 9/18 Dr. Kae Heller - Mild ileus seems to be improving. Try suppository this morning. Continue mobilizing. Continue IV zosyn. Will recheck later today for possible discharge. Plan for 5 days oral abx at discharge.    ID - zosyn 9/17>> FEN - CM diet VTE - lovenox Foley - none Follow up - [facility]   LOS: 3 days    Wellington Hampshire, [facility] 04/23/2021, 8:41 AM Please see Amion for pager number during day hours 7:00am-4:30pm

## 2021-04-24 LAB — GLUCOSE, CAPILLARY
Glucose-Capillary: 106 mg/dL — ABNORMAL HIGH (ref 70–99)
Glucose-Capillary: 163 mg/dL — ABNORMAL HIGH (ref 70–99)
Glucose-Capillary: 89 mg/dL (ref 70–99)

## 2021-04-24 LAB — CBC
HCT: 32.2 % — ABNORMAL LOW (ref 36.0–46.0)
Hemoglobin: 10.5 g/dL — ABNORMAL LOW (ref 12.0–15.0)
MCH: 28.2 pg (ref 26.0–34.0)
MCHC: 32.6 g/dL (ref 30.0–36.0)
MCV: 86.3 fL (ref 80.0–100.0)
Platelets: 267 10*3/uL (ref 150–400)
RBC: 3.73 MIL/uL — ABNORMAL LOW (ref 3.87–5.11)
RDW: 13.6 % (ref 11.5–15.5)
WBC: 7.2 10*3/uL (ref 4.0–10.5)
nRBC: 0 % (ref 0.0–0.2)

## 2021-04-24 LAB — BASIC METABOLIC PANEL
Anion gap: 9 (ref 5–15)
BUN: 9 mg/dL (ref 8–23)
CO2: 29 mmol/L (ref 22–32)
Calcium: 9 mg/dL (ref 8.9–10.3)
Chloride: 100 mmol/L (ref 98–111)
Creatinine, Ser: 1.03 mg/dL — ABNORMAL HIGH (ref 0.44–1.00)
GFR, Estimated: 58 mL/min — ABNORMAL LOW (ref >60)
Glucose, Bld: 106 mg/dL — ABNORMAL HIGH (ref 70–99)
Potassium: 3.6 mmol/L (ref 3.5–5.1)
Sodium: 138 mmol/L (ref 135–145)

## 2021-04-24 LAB — MAGNESIUM: Magnesium: 2.2 mg/dL (ref 1.7–2.4)

## 2021-04-24 NOTE — Discharge Summary (Signed)
Patient ID: Kimberly Welch [id number] Jun 30, 1951 70 y.o.  Admit date: 04/20/2021 Discharge date: 04/24/2021  Admitting Diagnosis: Acute appendicitis   Discharge Diagnosis  Subacute appendicitis - with peritoneal abscess  Consultants TRH  H&P: Kimberly Welch is an 70 y.o. female who is here for abdominal pain.  She has been having pain for a number of days now.  Infection a CT scan 5 days ago which showed a slight abnormal appearance of the appendix.  Her symptoms have continued with abdominal pain, lack of appetite, and general malaise.  Procedures Dr. Kae Heller - Laparoscopic Appendectomy - 04/21/2021  Hospital Course:  The patient was admitted and underwent a laparoscopic appendectomy.  The patient tolerated the procedure well. She developed a mild ileus after surgery. On POD 3 the patient reports she was tolerating her diet without n/v. She reports she was passing flatus and had a BM that morning am. Her pain is well controlled without prn pain meds and is was mobilizing in the halls and voiding. Patient was discharged home with follow up as noted below. Return precautions discussed.  Physical Exam: Gen:  Alert, NAD, pleasant HEENT: EOM's intact, pupils equal and round Card:  RRR Pulm:  CTAB, no W/R/R, effort normal Abd: Soft, NT/ND, +BS, Incisions with glue intact appears well and are without drainage, bleeding, or signs of infection Ext:  No LE edema  Psych: A&Ox3  Skin: no rashes noted, warm and dry  Allergies as of 04/24/2021       Reactions   Sulfa Antibiotics Swelling        Medication List     TAKE these medications    acetaminophen 325 MG tablet Commonly known as: TYLENOL Take 325 mg by mouth every 6 (six) hours as needed for moderate pain or headache.   amoxicillin-clavulanate 875-125 MG tablet Commonly known as: Augmentin Take 1 tablet by mouth 2 (two) times daily for 5 days.   aspirin EC 81 MG tablet Take 162 mg by mouth daily as needed (when eat fried  foods). Swallow whole.   aspirin EC 325 MG tablet Take 325 mg by mouth daily as needed (when eating fried food.). Pt only takes this when she doesn't have the 81 mg tabs   DRY EYES OP Place 1 drop into both eyes daily as needed (dry eyes).   losartan-hydrochlorothiazide 50-12.5 MG tablet Commonly known as: HYZAAR Take 1 tablet by mouth daily.   meclizine 25 MG tablet Commonly known as: ANTIVERT Take 1 tablet (25 mg total) by mouth 3 (three) times daily as needed for dizziness.   metFORMIN 500 MG tablet Commonly known as: GLUCOPHAGE Take 1 tablet (500 mg total) by mouth daily with breakfast.   ondansetron 8 MG tablet Commonly known as: Zofran Take 1 tablet (8 mg total) by mouth every 8 (eight) hours as needed for nausea or vomiting.   pravastatin 20 MG tablet Commonly known as: PRAVACHOL Take 1 tablet (20 mg total) by mouth daily. What changed: when to take this   traMADol 50 MG tablet Commonly known as: ULTRAM Take 1 tablet (50 mg total) by mouth every 6 (six) hours as needed for moderate pain.          Follow-up [facility], [place]. Call.   Specialty: General Surgery Why: We are working on your appointment, call to confirm Please arrive 30 minutes prior to your appointment to check in and fill out paperwork. Bring photo ID and insurance information. Contact information: [id number] [place]                Signed: Alferd Apa, [facility] 04/24/2021, 10:03 AM Please see Amion for pager number during day hours 7:00am-4:30pm

## 2021-04-25 LAB — CULTURE, BLOOD (ROUTINE X 2)
Culture: NO GROWTH
Culture: NO GROWTH
Special Requests: ADEQUATE
Special Requests: ADEQUATE

## 2021-04-25 LAB — SURGICAL PATHOLOGY

## 2021-04-26 ENCOUNTER — Telehealth: Payer: Self-pay | Admitting: Surgery

## 2021-04-26 NOTE — Telephone Encounter (Signed)
Attempted to call patient regarding pathology results from recent appendectomy- left voicemail that I will try to call her again next week.

## 2021-06-17 ENCOUNTER — Emergency Department (HOSPITAL_BASED_OUTPATIENT_CLINIC_OR_DEPARTMENT_OTHER)
Admission: EM | Admit: 2021-06-17 | Discharge: 2021-06-17 | Disposition: A | Discharge status: Home / Self Care | Payer: Medicare Other | Attending: Emergency Medicine | Admitting: Emergency Medicine

## 2021-06-17 ENCOUNTER — Other Ambulatory Visit: Payer: Self-pay

## 2021-06-17 DIAGNOSIS — E785 Hyperlipidemia, unspecified: Secondary | ICD-10-CM | POA: Insufficient documentation

## 2021-06-17 DIAGNOSIS — K5900 Constipation, unspecified: Secondary | ICD-10-CM | POA: Diagnosis not present

## 2021-06-17 DIAGNOSIS — E1169 Type 2 diabetes mellitus with other specified complication: Secondary | ICD-10-CM | POA: Insufficient documentation

## 2021-06-17 DIAGNOSIS — I10 Essential (primary) hypertension: Secondary | ICD-10-CM | POA: Diagnosis not present

## 2021-06-17 DIAGNOSIS — Z79899 Other long term (current) drug therapy: Secondary | ICD-10-CM | POA: Insufficient documentation

## 2021-06-17 DIAGNOSIS — Z7984 Long term (current) use of oral hypoglycemic drugs: Secondary | ICD-10-CM | POA: Insufficient documentation

## 2021-06-17 NOTE — ED Notes (Addendum)
First contact with patient. Patient arrived via triage from home with complaints of generalized abdominal/rectal pain due to constipation x 2 days. Patient states she tried to manually disimpact herself and states now her rectum is sore. Pt is A&OX 4. Respirations even/unlabored. Patient changed into gown and placed on monitor and call light within reach. Patient updated on plan of care. Will continue to monitor patient.   1426: Patient states she "feels something moving" and wanted to try and use the restroom - Patient up to toilet at this time.  1447: Patient states she had a large BM in the restroom and feels "so much better" PA made aware and states will come assess patient.

## 2021-06-17 NOTE — ED Notes (Signed)
No acute distress noted upon this RN's departure of patient. Verified discharge paperwork with name and DOB. Vital signs stable. Patient taken to checkout window. Discharge paperwork discussed with patient. No further questions voiced upon discharge.

## 2021-06-17 NOTE — ED Triage Notes (Signed)
Patient reports to the ER for abdominal pain/constipation. Patient reports she has rectal pain after having to manually disimpact herself. Patient reports her rectum is sore and she is to follow up with the cancer doctor for concerns for Colon cancer.

## 2021-06-18 NOTE — ED Provider Notes (Signed)
[facility] EMERGENCY DEPT Provider Note   CSN: [id number] Arrival date & time: 06/17/21  1121     History Chief Complaint  Patient presents with  . Constipation    Kimberly Welch is a 70 y.o. female.  HPI 70 year old female presents to the emergency department today for evaluation of constipation.  Past medical history includes hypertension, hyperlipidemia and diabetes.  Patient reports that several months ago she had an appendectomy and was found to have possible cancer.  Patient is scheduled for GI to have a colonoscopy and endoscopy performed.  Patient reports that she has been constipated and tried to manually disimpact herself today.  Patient ports some pain to her rectum.  She reports that she tried taking several over-the-counter laxatives and stool softeners and also tried an enema without any relief of symptoms.  Patient denies any bloody stools.  No fevers or chills.  No nausea or vomiting.  She denies any abdominal pain.  On my assessment patient reports that she had a bowel movement she was in the ER she reports that her symptoms have completely resolved.    Past Medical History:  Diagnosis Date  . Cholelithiasis    symptomatic  . GERD (gastroesophageal reflux disease)   . Hyperlipidemia   . Hypertension   . Type 2 diabetes mellitus ([place])   . Wears partial dentures    UPPER AND LOWER    Patient Active Problem List   Diagnosis Date Noted  . Acute appendicitis 04/20/2021  . LLQ pain 04/12/2021  . BPPV (benign paroxysmal positional vertigo) 12/28/2020  . Fibroids 04/27/2018  . Atypical squamous cells cannot exclude high grade squamous intraepithelial lesion on cytologic smear of cervix (ASC-H) 04/27/2018  . Right hip pain 01/06/2017  . Hemorrhoid 05/01/2016  . Elevated LFTs 10/09/2015  . Diabetes mellitus type 2 in obese ([place]) 07/05/2014  . Palpitations 07/05/2014  . Hyperlipidemia associated with type 2 diabetes mellitus ([place]) 07/05/2014  .  Essential hypertension 07/05/2014  . Routine general medical examination at a health care facility 07/05/2014    Past Surgical History:  Procedure Laterality Date  . CHOLECYSTECTOMY N/A 11/28/2015   Procedure: LAPAROSCOPIC CHOLECYSTECTOMY;  Surgeon: Mickeal Skinner, MD;  Location: [facility];  Service: General;  Laterality: N/A;  . D & C HYSTEROSCOPY/  RESECTION ENDOMETRIAL POLYP'S  01-11-2009  . DILATATION & CURRETTAGE/HYSTEROSCOPY WITH RESECTOCOPE N/A 04/27/2018   Procedure: DILATION  AND CURETTAGE / HYSTEROSCOPY WITH RESECTION;  Surgeon: Eldred Manges, MD;  Location: [facility];  Service: Gynecology;  Laterality: N/A;  . LAPAROSCOPIC APPENDECTOMY N/A 04/21/2021   Procedure: APPENDECTOMY LAPAROSCOPIC;  Surgeon: Clovis Riley, MD;  Location: [facility] OR;  Service: General;  Laterality: N/A;     OB History   No obstetric history on file.     Family History  Problem Relation Age of Onset  . Prostate cancer Brother   . Colon cancer Neg Hx   . Appendicitis Neg Hx     Social History   Tobacco Use  . Smoking status: Never  . Smokeless tobacco: Never  Vaping Use  . Vaping Use: Never used  Substance Use Topics  . Alcohol use: Yes    Alcohol/week: 0.0 standard drinks    Comment: RARE  . Drug use: No    Home Medications Prior to Admission medications   Medication Sig Start Date End Date Taking? Authorizing Provider  acetaminophen (TYLENOL) 325 MG tablet Take 325 mg by mouth every 6 (six) hours as needed for  moderate pain or headache.    [provider]  Artificial Tear Ointment (DRY EYES OP) Place 1 drop into both eyes daily as needed (dry eyes).    [provider]  aspirin EC 325 MG tablet Take 325 mg by mouth daily as needed (when eating fried food.). Pt only takes this when she doesn't have the 81 mg tabs    [provider]  aspirin EC 81 MG tablet Take 162 mg by mouth daily as needed (when eat fried foods).  Swallow whole.    [provider]  losartan-hydrochlorothiazide (HYZAAR) 50-12.5 MG tablet Take 1 tablet by mouth daily. 12/27/20   Hoyt Koch, MD  meclizine (ANTIVERT) 25 MG tablet Take 1 tablet (25 mg total) by mouth 3 (three) times daily as needed for dizziness. 12/27/20   Hoyt Koch, MD  metFORMIN (GLUCOPHAGE) 500 MG tablet Take 1 tablet (500 mg total) by mouth daily with breakfast. 12/27/20   Hoyt Koch, MD  ondansetron (ZOFRAN) 8 MG tablet Take 1 tablet (8 mg total) by mouth every 8 (eight) hours as needed for nausea or vomiting. 04/20/20   Marrian Salvage, FNP  pravastatin (PRAVACHOL) 20 MG tablet Take 1 tablet (20 mg total) by mouth daily. Patient taking differently: Take 20 mg by mouth at bedtime. 10/03/20   Hoyt Koch, MD  traMADol (ULTRAM) 50 MG tablet Take 1 tablet (50 mg total) by mouth every 6 (six) hours as needed for moderate pain. 04/22/21   Meuth, Brooke A, PA-C    Allergies    Sulfa antibiotics  Review of Systems   Review of Systems  Constitutional:  Negative for chills and fever.  HENT:  Negative for congestion.   Eyes:  Negative for discharge.  Gastrointestinal:  Positive for abdominal pain (resolved) and constipation. Negative for blood in stool, nausea and vomiting.  Genitourinary:  Negative for dysuria.  Musculoskeletal:  Negative for myalgias.  Skin:  Negative for rash.  Neurological:  Negative for headaches.  Psychiatric/Behavioral:  Negative for confusion.    Physical Exam Updated Vital Signs BP 122/70 (BP Location: Right Arm)   Pulse 88   Temp 98.1 F (36.7 C) (Oral)   Resp 16   Ht 4\' 11"  (1.499 m)   Wt 68.9 kg   LMP 08/05/2003   SpO2 100%   BMI 30.70 kg/m   Physical Exam Vitals and nursing note reviewed.  Constitutional:      General: She is not in acute distress.    Appearance: She is well-developed. She is not ill-appearing or toxic-appearing.  HENT:     Head: Normocephalic and atraumatic.   Eyes:     General: No scleral icterus.       Right eye: No discharge.        Left eye: No discharge.  Pulmonary:     Effort: No respiratory distress.  Abdominal:     General: Abdomen is flat. Bowel sounds are normal. There is no distension.     Palpations: Abdomen is soft. There is no mass.     Tenderness: There is no abdominal tenderness. There is no right CVA tenderness, left CVA tenderness, guarding or rebound.     Hernia: No hernia is present.  Musculoskeletal:        General: Normal range of motion.     Cervical back: Normal range of motion.  Skin:    General: Skin is warm and dry.     Capillary Refill: Capillary refill takes less than 2  seconds.     Coloration: Skin is not pale.  Neurological:     Mental Status: She is alert.  Psychiatric:        Mood and Affect: Mood normal.        Behavior: Behavior normal.        Thought Content: Thought content normal.        Judgment: Judgment normal.    ED Results / Procedures / Treatments   Labs (all labs ordered are listed, but only abnormal results are displayed) Labs Reviewed - No data to display  EKG None  Radiology No results found.  Procedures Procedures   Medications Ordered in ED Medications - No data to display  ED Course  I have reviewed the triage vital signs and the nursing notes.  Pertinent labs & imaging results that were available during my care of the patient were reviewed by me and considered in my medical decision making (see chart for details).    MDM Rules/Calculators/A&P                           Patient presents for constipation.  Patient reports that she had a bowel movement while she was in the ER and she feels significantly improved.  Denies any abdominal pain, rectal pain.  No bloody stools.  No fevers or chills.  She like to be discharged.  She reports having a follow-up with GI this week to schedule for colonoscopy.  Patient was offered further work-up with blood work and imaging she  declined which I feel is reasonable.  Discussed primary care follow-up reasons return the ER. Final Clinical Impression(s) / ED Diagnoses Final diagnoses:  Constipation, unspecified constipation type    Rx / DC Orders ED Discharge Orders     None        Aaron Edelman 06/18/21 2110    Deno Etienne, DO 06/18/21 2249

## 2021-06-19 DIAGNOSIS — Z791 Long term (current) use of non-steroidal anti-inflammatories (NSAID): Secondary | ICD-10-CM | POA: Diagnosis not present

## 2021-06-19 DIAGNOSIS — C181 Malignant neoplasm of appendix: Secondary | ICD-10-CM | POA: Diagnosis not present

## 2021-06-19 DIAGNOSIS — D378 Neoplasm of uncertain behavior of other specified digestive organs: Secondary | ICD-10-CM | POA: Diagnosis not present

## 2021-06-19 DIAGNOSIS — E785 Hyperlipidemia, unspecified: Secondary | ICD-10-CM | POA: Diagnosis not present

## 2021-06-19 DIAGNOSIS — I10 Essential (primary) hypertension: Secondary | ICD-10-CM | POA: Diagnosis not present

## 2021-06-19 DIAGNOSIS — E119 Type 2 diabetes mellitus without complications: Secondary | ICD-10-CM | POA: Diagnosis not present

## 2021-06-19 DIAGNOSIS — Z7984 Long term (current) use of oral hypoglycemic drugs: Secondary | ICD-10-CM | POA: Diagnosis not present

## 2021-06-19 DIAGNOSIS — R978 Other abnormal tumor markers: Secondary | ICD-10-CM | POA: Diagnosis not present

## 2021-06-19 HISTORY — DX: Malignant neoplasm of appendix: C18.1

## 2021-06-20 ENCOUNTER — Other Ambulatory Visit (HOSPITAL_BASED_OUTPATIENT_CLINIC_OR_DEPARTMENT_OTHER): Payer: Self-pay

## 2021-06-20 ENCOUNTER — Ambulatory Visit: Payer: Medicare Other | Attending: Internal Medicine

## 2021-06-20 DIAGNOSIS — Z23 Encounter for immunization: Secondary | ICD-10-CM

## 2021-06-20 MED ORDER — PFIZER COVID-19 VAC BIVALENT 30 MCG/0.3ML IM SUSP
INTRAMUSCULAR | 0 refills | Status: DC
  Filled 2021-06-20: qty 0.3, 1d supply, fill #0

## 2021-06-20 NOTE — Progress Notes (Signed)
   Covid-19 Vaccination Clinic  Name:  Kimberly Welch    MRN: [id number] DOB: Feb 13, 1951  06/20/2021  [name] was observed post Covid-19 immunization for 15 minutes without incident. She was provided with Vaccine Information Sheet and instruction to access the V-Safe system.   [name] was instructed to call 911 with any severe reactions post vaccine: Difficulty breathing  Swelling of face and throat  A fast heartbeat  A bad rash all over body  Dizziness and weakness   Immunizations Administered     Name Date Dose VIS Date Route   Pfizer Covid-19 Vaccine Bivalent Booster 06/20/2021 11:03 AM 0.3 mL 04/03/2021 Intramuscular   Manufacturer: [place]   Lot: [id number]   [place]: [phone]

## 2021-07-15 DIAGNOSIS — Z20822 Contact with and (suspected) exposure to covid-19: Secondary | ICD-10-CM | POA: Diagnosis not present

## 2021-07-15 DIAGNOSIS — Z03818 Encounter for observation for suspected exposure to other biological agents ruled out: Secondary | ICD-10-CM | POA: Diagnosis not present

## 2021-07-15 DIAGNOSIS — R5383 Other fatigue: Secondary | ICD-10-CM | POA: Diagnosis not present

## 2021-07-15 DIAGNOSIS — R051 Acute cough: Secondary | ICD-10-CM | POA: Diagnosis not present

## 2021-09-11 ENCOUNTER — Encounter (HOSPITAL_BASED_OUTPATIENT_CLINIC_OR_DEPARTMENT_OTHER): Payer: Self-pay | Admitting: Nurse Practitioner

## 2021-09-11 ENCOUNTER — Ambulatory Visit (INDEPENDENT_AMBULATORY_CARE_PROVIDER_SITE_OTHER): Payer: Medicare Other | Admitting: Nurse Practitioner

## 2021-09-11 ENCOUNTER — Other Ambulatory Visit: Payer: Self-pay

## 2021-09-11 VITALS — BP 117/82 | HR 100 | Ht 59.0 in | Wt 150.2 lb

## 2021-09-11 DIAGNOSIS — E669 Obesity, unspecified: Secondary | ICD-10-CM | POA: Diagnosis not present

## 2021-09-11 DIAGNOSIS — E785 Hyperlipidemia, unspecified: Secondary | ICD-10-CM | POA: Diagnosis not present

## 2021-09-11 DIAGNOSIS — I10 Essential (primary) hypertension: Secondary | ICD-10-CM | POA: Diagnosis not present

## 2021-09-11 DIAGNOSIS — E1169 Type 2 diabetes mellitus with other specified complication: Secondary | ICD-10-CM | POA: Diagnosis not present

## 2021-09-11 DIAGNOSIS — Z0000 Encounter for general adult medical examination without abnormal findings: Secondary | ICD-10-CM

## 2021-09-11 MED ORDER — PRAVASTATIN SODIUM 20 MG PO TABS: 20.0000 mg | ORAL_TABLET | Freq: Every day | ORAL | 3 refills | Status: DC

## 2021-09-11 NOTE — Patient Instructions (Addendum)
Thank you for choosing [place] at [facility] for your Primary Care needs. I am excited for the opportunity to partner with you to meet your health care goals. It was a pleasure meeting you today!  Recommendations from today's visit: You can come back for labs at your convenience. Be sure you are fasting when you come back to have these done.  I sent in the order for a mammogram for you- they will call you to schedule this  I sent the order for cologuard. They will mail this to your home. If they want [place] to do a colonoscopy I will change the order and have someone call you to schedule that.   Information on diet, exercise, and health maintenance recommendations are listed below. This is information to help you be sure you are on track for optimal health and monitoring.   Please look over this and let us know if you have any questions or if you have completed any of the health maintenance outside of [place] so that we can be sure your records are up to date.  ___________________________________________________________ About Me: I am an Adult-Geriatric Nurse Practitioner with a background in caring for patients for more than 20 years with a strong intensive care background. I provide primary care and sports medicine services to patients age 22 and older within this office. My education had a strong focus on caring for the older adult population, which I am passionate about. I am also the director of the APP Fellowship with [facility]   My desire is to provide you with the best service through preventive medicine and supportive care. I consider you a part of the medical team and value your input. I work diligently to ensure that you are heard and your needs are met in a safe and effective manner. I want you to feel comfortable with me as your provider and want you to know that your health concerns are important to me.  For your information, our office hours are: Monday,  Tuesday, and Thursday 8:00 AM - 5:00 PM Wednesday and Friday 8:00 AM - 12:00 PM.   In my time away from the office I am teaching new APP's within the system and am unavailable, but my partner, Dr. Burnard Bunting is in the office for emergent needs.   If you have questions or concerns, please call our office at [phone] or send [place] a MyChart message and we will respond as quickly as possible.  ____________________________________________________________ MyChart:  For all urgent or time sensitive needs we ask that you please call the office to avoid delays. Our number is [phone]. MyChart is not constantly monitored and due to the large volume of messages a day, replies may take up to 72 business hours.  MyChart Policy: MyChart allows for you to see your visit notes, after visit summary, provider recommendations, lab and tests results, make an appointment, request refills, and contact your provider or the office for non-urgent questions or concerns. Providers are seeing patients during normal business hours and do not have built in time to review MyChart messages.  We ask that you allow a minimum of 3 business days for responses to [employer]. For this reason, please do not send urgent requests through [place]. Please call the office at [phone]. New and ongoing conditions may require a visit. We have virtual and in person visit available for your convenience.  Complex MyChart concerns may require a visit. Your provider may request you schedule a  virtual or in person visit to ensure we are providing the best care possible. MyChart messages sent after 11:00 AM on Friday will not be received by the provider until Monday morning.    Lab and Test Results: You will receive your lab and test results on MyChart as soon as they are completed and results have been sent by the lab or testing facility. Due to this service, you will receive your results BEFORE your provider.  I review lab and tests  results each morning prior to seeing patients. Some results require collaboration with other providers to ensure you are receiving the most appropriate care. For this reason, we ask that you please allow a minimum of 3-5 business days from the time the ALL results have been received for your provider to receive and review lab and test results and contact you about these.  Most lab and test result comments from the provider will be sent through [place]. Your provider may recommend changes to the plan of care, follow-up visits, repeat testing, ask questions, or request an office visit to discuss these results. You may reply directly to this message or call the office at [phone] to provide information for the provider or set up an appointment. In some instances, you will be called with test results and recommendations. Please let us know if this is preferred and we will make note of this in your chart to provide this for you.    If you have not heard a response to your lab or test results in 5 business days from all results returning to [place], please call the office to let us know. We ask that you please avoid calling prior to this time unless there is an emergent concern. Due to high call volumes, this can delay the resulting process.  After Hours: For all non-emergency after hours needs, please call the office at [phone] and select the option to reach the on-call provider service. On-call services are shared between multiple [place] offices and therefore it will not be possible to speak directly with your provider. On-call providers may provide medical advice and recommendations, but are unable to provide refills for maintenance medications.  For all emergency or urgent medical needs after normal business hours, we recommend that you seek care at the closest Urgent Care or Emergency Department to ensure appropriate treatment in a timely manner.  [facility] has a 24 hour  emergency room located on the ground floor for your convenience.   Urgent Concerns During the Business Day Providers are seeing patients from 8AM to [place] with a busy schedule and are most often not able to respond to non-urgent calls until the end of the day or the next business day. If you should have URGENT concerns during the day, please call and speak to the nurse or schedule a same day appointment so that we can address your concern without delay.   Thank you, again, for choosing me as your health care partner. I appreciate your trust and look forward to learning more about you.   [name], DNP, AGNP-c ___________________________________________________________  Health Maintenance Recommendations Screening Testing Mammogram Every 1 -2 years based on history and risk factors Starting at age 18 Pap Smear Ages 21-39 every 3 years Ages 60-65 every 5 years with HPV testing More frequent testing may be required based on results and history Colon Cancer Screening Every 1-10 years based on test performed, risk factors, and history Starting at age 62 Bone Density Screening  Every 2-10 years based on history Starting at age 56 for women Recommendations for men differ based on medication usage, history, and risk factors AAA Screening One time ultrasound Men 63-42 years old who have every smoked Lung Cancer Screening Low Dose Lung CT every 12 months Age 29-80 years with a 30 pack-year smoking history who still smoke or who have quit within the last 15 years  Screening Labs Routine  Labs: Complete Blood Count (CBC), Complete Metabolic Panel (CMP), Cholesterol (Lipid Panel) Every 6-12 months based on history and medications May be recommended more frequently based on current conditions or previous results Hemoglobin A1c Lab Every 3-12 months based on history and previous results Starting at age 19 or earlier with diagnosis of diabetes, high cholesterol, BMI >26, and/or risk  factors Frequent monitoring for patients with diabetes to ensure blood sugar control Thyroid Panel (TSH w/ T3 & T4) Every 6 months based on history, symptoms, and risk factors May be repeated more often if on medication HIV One time testing for all patients 20 and older May be repeated more frequently for patients with increased risk factors or exposure Hepatitis C One time testing for all patients 40 and older May be repeated more frequently for patients with increased risk factors or exposure Gonorrhea, Chlamydia Every 12 months for all sexually active persons 13-24 years Additional monitoring may be recommended for those who are considered high risk or who have symptoms PSA Men 40-4 years old with risk factors Additional screening may be recommended from age 74-69 based on risk factors, symptoms, and history  Vaccine Recommendations Tetanus Booster All adults every 10 years Flu Vaccine All patients 6 months and older every year COVID Vaccine All patients 12 years and older Initial dosing with booster May recommend additional booster based on age and health history HPV Vaccine 2 doses all patients age 42-26 Dosing may be considered for patients over 26 Shingles Vaccine (Shingrix) 2 doses all adults 21 years and older Pneumonia (Pneumovax 23) All adults 62 years and older May recommend earlier dosing based on health history Pneumonia (Prevnar 75) All adults 51 years and older Dosed 1 year after Pneumovax 23  Additional Screening, Testing, and Vaccinations may be recommended on an individualized basis based on family history, health history, risk factors, and/or exposure.  __________________________________________________________  Diet Recommendations for All Patients  I recommend that all patients maintain a diet low in saturated fats, carbohydrates, and cholesterol. While this can be challenging at first, it is not impossible and small changes can make big differences.   Things to try: Decreasing the amount of soda, sweet tea, and/or juice to one or less per day and replace with water While water is always the first choice, if you do not like water you may consider adding a water additive without sugar to improve the taste other sugar free drinks Replace potatoes with a brightly colored vegetable at dinner Use healthy oils, such as canola oil or olive oil, instead of butter or hard margarine Limit your bread intake to two pieces or less a day Replace regular pasta with low carb pasta options Bake, broil, or grill foods instead of frying Monitor portion sizes  Eat smaller, more frequent meals throughout the day instead of large meals  An important thing to remember is, if you love foods that are not great for your health, you don't have to give them up completely. Instead, allow these foods to be a reward when you have done well. Allowing yourself to still  have special treats every once in a while is a nice way to tell yourself thank you for working hard to keep yourself healthy.   Also remember that every day is a new day. If you have a bad day and "fall off the wagon", you can still climb right back up and keep moving along on your journey!  We have resources available to help you!  Some websites that may be helpful include: www.http://carter.biz/  Www.VeryWellFit.com _____________________________________________________________  Activity Recommendations for All Patients  I recommend that all adults get at least 20 minutes of moderate physical activity that elevates your heart rate at least 5 days out of the week.  Some examples include: Walking or jogging at a pace that allows you to carry on a conversation Cycling (stationary bike or outdoors) Water aerobics Yoga Weight lifting Dancing If physical limitations prevent you from putting stress on your joints, exercise in a pool or seated in a chair are excellent options.  Do determine your MAXIMUM heart  rate for activity: YOUR AGE - 220 = MAX HeartRate   Remember! Do not push yourself too hard.  Start slowly and build up your pace, speed, weight, time in exercise, etc.  Allow your body to rest between exercise and get good sleep. You will need more water than normal when you are exerting yourself. Do not wait until you are thirsty to drink. Drink with a purpose of getting in at least 8, 8 ounce glasses of water a day plus more depending on how much you exercise and sweat.    If you begin to develop dizziness, chest pain, abdominal pain, jaw pain, shortness of breath, headache, vision changes, lightheadedness, or other concerning symptoms, stop the activity and allow your body to rest. If your symptoms are severe, seek emergency evaluation immediately. If your symptoms are concerning, but not severe, please let us know so that we can recommend further evaluation.

## 2021-09-11 NOTE — Progress Notes (Signed)
Orma Render, DNP, AGNP-c Primary Care & Sports Medicine [address]   [place], [place] [address] [phone]  New patient visit   Patient: Kimberly Welch   DOB: 1951-02-14   71 y.o. Female  MRN: [id number] Visit Date: 09/11/2021  Patient Care Team: Oren Barella, Coralee Pesa, NP as PCP - General (Nurse Practitioner) Eldred Manges, MD (Inactive) as Consulting Physician (Obstetrics and Gynecology) Gatha Mayer, MD as Consulting Physician (Gastroenterology)  Today's healthcare provider: Orma Render, NP   Chief Complaint  Patient presents with   New Patient (Initial Visit)    Patient presents today to establish care.Patient stated she would like a full lab panel done today. She would like a referral for mammogram and discuss colorguard.    Subjective    Kimberly Welch is a 71 y.o. female who presents today as a new patient to establish care.    Patient endorses the following concerns presently: Lab panel, mammogram, colon ca screening.     History reviewed and reveals the following: Past Medical History:  Diagnosis Date   Cholelithiasis    symptomatic   GERD (gastroesophageal reflux disease)    Hyperlipidemia    Hypertension    Type 2 diabetes mellitus ([place])    Wears partial dentures    UPPER AND LOWER   Past Surgical History:  Procedure Laterality Date   CHOLECYSTECTOMY N/A 11/28/2015   Procedure: LAPAROSCOPIC CHOLECYSTECTOMY;  Surgeon: Mickeal Skinner, MD;  [facility];  Service: General;  Laterality: N/A;   D & C HYSTEROSCOPY/  RESECTION ENDOMETRIAL POLYP'S  01-11-2009   DILATATION & CURRETTAGE/HYSTEROSCOPY WITH RESECTOCOPE N/A 04/27/2018   Procedure: DILATION  AND CURETTAGE / HYSTEROSCOPY WITH RESECTION;  Surgeon: Eldred Manges, MD;  Location: [facility];  Service: Gynecology;  Laterality: N/A;   LAPAROSCOPIC APPENDECTOMY N/A 04/21/2021   Procedure: APPENDECTOMY LAPAROSCOPIC;  Surgeon:  Clovis Riley, MD;  Location: [facility];  Service: General;  Laterality: N/A;   Family Status  Relation Name Status   Mother  Deceased   Father  Deceased   Brother  (Not Specified)   Neg Hx  (Not Specified)   Family History  Problem Relation Age of [place]   Prostate cancer Brother    Colon cancer Neg Hx    Appendicitis Neg Hx    Social History   Socioeconomic History   Marital status: Significant Other    Spouse name: Not on file   Number of children: Not on file   Years of education: Not on file   Highest education level: Not on file  Occupational History   Not on file  Tobacco Use   Smoking status: Never   Smokeless tobacco: Never  Vaping Use   Vaping Use: Never used  Substance and Sexual Activity   Alcohol use: Yes    Alcohol/week: 0.0 standard drinks    Comment: RARE   Drug use: No   Sexual activity: Not on file  Other Topics Concern   Not on file  Social History Narrative   Not on file   Social Determinants of Health   Financial Resource Strain: Not on file  Food Insecurity: Not on file  Transportation Needs: Not on file  Physical Activity: Not on file  Stress: Not on file  Social Connections: Not on file   Outpatient Medications Prior to Visit  Medication Sig   Artificial Tear Ointment (DRY EYES OP) Place 1 drop into both eyes daily as  needed (dry eyes).   aspirin EC 325 MG tablet Take 325 mg by mouth daily as needed (when eating fried food.). Pt only takes this when she doesn't have the 81 mg tabs   aspirin EC 81 MG tablet Take 162 mg by mouth daily as needed (when eat fried foods). Swallow whole.   cycloSPORINE (RESTASIS) 0.05 % ophthalmic emulsion Restasis 0.05 % eye drops in a dropperette   latanoprost (XALATAN) 0.005 % ophthalmic solution latanoprost 0.005 % eye drops   losartan-hydrochlorothiazide (HYZAAR) 50-12.5 MG tablet Take 1 tablet by mouth daily.   metFORMIN (GLUCOPHAGE) 500 MG tablet Take 1 tablet (500 mg total) by mouth daily with  breakfast.   triamcinolone cream (KENALOG) 0.1 % triamcinolone acetonide 0.1 % topical cream   albuterol (VENTOLIN HFA) 108 (90 Base) MCG/ACT inhaler Inhale 2 puffs into the lungs every 6 (six) hours as needed. (Patient not taking: Reported on 09/16/2021)   COVID-19 mRNA bivalent vaccine, Pfizer, (PFIZER COVID-19 VAC BIVALENT) injection Inject into the muscle. (Patient not taking: Reported on 09/16/2021)   meclizine (ANTIVERT) 25 MG tablet Take 1 tablet (25 mg total) by mouth 3 (three) times daily as needed for dizziness. (Patient not taking: Reported on 09/16/2021)   [DISCONTINUED] acetaminophen (TYLENOL) 325 MG tablet Take 325 mg by mouth every 6 (six) hours as needed for moderate pain or headache. (Patient not taking: Reported on 09/16/2021)   [DISCONTINUED] ondansetron (ZOFRAN) 8 MG tablet Take 1 tablet (8 mg total) by mouth every 8 (eight) hours as needed for nausea or vomiting.   [DISCONTINUED] pravastatin (PRAVACHOL) 20 MG tablet Take 1 tablet (20 mg total) by mouth daily. (Patient taking differently: Take 20 mg by mouth at bedtime.)   [DISCONTINUED] traMADol (ULTRAM) 50 MG tablet Take 1 tablet (50 mg total) by mouth every 6 (six) hours as needed for moderate pain.   No facility-administered medications prior to visit.   Allergies  Allergen Reactions   Sulfa Antibiotics Swelling   Immunization History  Administered Date(s) Administered   Fluad Quad(high Dose 65+) 03/28/2019, 04/12/2021   PFIZER(Purple Top)SARS-COV-2 Vaccination 09/26/2019, 10/21/2019, 06/22/2020   Pfizer Covid-19 Vaccine Bivalent Booster 90yrs & up 06/20/2021   Pneumococcal Conjugate-13 02/09/2018   Pneumococcal Polysaccharide-23 10/03/2020   Tdap 02/09/2018    Health Maintenance Due: Health Maintenance  Topic Date Due   Zoster Vaccines- Shingrix (1 of 2) Never done   DEXA SCAN  Never done   COLONOSCOPY (Pts 45-66yrs Insurance coverage will need to be confirmed)  02/27/2018   MAMMOGRAM  01/27/2020   FOOT EXAM   10/03/2021   HEMOGLOBIN A1C  10/10/2021   OPHTHALMOLOGY EXAM  01/25/2022   TETANUS/TDAP  02/10/2028   Pneumonia Vaccine 107+ Years old  Completed   INFLUENZA VACCINE  Completed   COVID-19 Vaccine  Completed   Hepatitis C Screening  Completed   HPV VACCINES  Aged Out    Review of Systems All review of systems negative except what is listed in the HPI   Objective    BP 117/82    Pulse 100    Ht 4\' 11"  (1.499 m)    Wt 150 lb 3.2 oz (68.1 kg)    LMP 08/05/2003    SpO2 98%    BMI 30.34 kg/m  Physical Exam Vitals and nursing note reviewed.  Constitutional:      General: She is not in acute distress.    Appearance: Normal appearance.  Eyes:     Extraocular Movements: Extraocular movements intact.     Conjunctiva/sclera:  Conjunctivae normal.     Pupils: Pupils are equal, round, and reactive to light.  Neck:     Vascular: No carotid bruit.  Cardiovascular:     Rate and Rhythm: Normal rate and regular rhythm.     Pulses: Normal pulses.     Heart sounds: Normal heart sounds. No murmur heard. Pulmonary:     Effort: Pulmonary effort is normal.     Breath sounds: Normal breath sounds. No wheezing.  Abdominal:     General: Bowel sounds are normal.     Palpations: Abdomen is soft.  Musculoskeletal:        General: Normal range of motion.     Cervical back: Normal range of motion.     Right lower leg: No edema.     Left lower leg: No edema.  Skin:    General: Skin is warm and dry.     Capillary Refill: Capillary refill takes less than 2 seconds.  Neurological:     General: No focal deficit present.     Mental Status: She is alert and oriented to person, place, and time.  Psychiatric:        Mood and Affect: Mood normal.        Behavior: Behavior normal.        Thought Content: Thought content normal.        Judgment: Judgment normal.    No results found for any visits on 09/11/21.  Assessment & Plan      Problem List Items Addressed This Visit     Essential hypertension  (Chronic)    Chronic.BP well controlled today. No alarm sx. No refills needed. Labs today.       Relevant Medications   pravastatin (PRAVACHOL) 20 MG tablet   Other Relevant Orders   Comprehensive metabolic panel   CBC with Differential/Platelet   Lipid panel   Hemoglobin A1c   Type 2 diabetes mellitus ([place]) - Primary    Chronic. On metformin daily.  Will obtain labs today.      Relevant Medications   pravastatin (PRAVACHOL) 20 MG tablet   Hyperlipidemia associated with type 2 diabetes mellitus (HCC)    Chronic. On pravastatin.  Will monitor labs today.       Relevant Medications   pravastatin (PRAVACHOL) 20 MG tablet   Other Relevant Orders   Comprehensive metabolic panel   CBC with Differential/Platelet   Lipid panel   Hemoglobin A1c   Encounter for medical examination to establish care     Return in about 3 months (around 12/09/2021) for next week for labs. .     Time: 45 minutes, >50% spent counseling, care coordination, chart review, and documentation.    Brenda Cowher, Coralee Pesa, NP, DNP, AGNP-C Primary Care & [facility] Medicine [facility] [place]

## 2021-09-16 DIAGNOSIS — K219 Gastro-esophageal reflux disease without esophagitis: Secondary | ICD-10-CM | POA: Insufficient documentation

## 2021-09-16 DIAGNOSIS — N952 Postmenopausal atrophic vaginitis: Secondary | ICD-10-CM | POA: Insufficient documentation

## 2021-09-16 NOTE — Assessment & Plan Note (Signed)
Chronic. On pravastatin.  Will monitor labs today.

## 2021-09-16 NOTE — Assessment & Plan Note (Signed)
Chronic.BP well controlled today. No alarm sx. No refills needed. Labs today.

## 2021-09-16 NOTE — Assessment & Plan Note (Signed)
Chronic. On metformin daily.  Will obtain labs today.

## 2021-09-19 ENCOUNTER — Ambulatory Visit (HOSPITAL_BASED_OUTPATIENT_CLINIC_OR_DEPARTMENT_OTHER): Payer: Medicare Other

## 2021-09-19 DIAGNOSIS — E785 Hyperlipidemia, unspecified: Secondary | ICD-10-CM | POA: Diagnosis not present

## 2021-09-19 DIAGNOSIS — E1169 Type 2 diabetes mellitus with other specified complication: Secondary | ICD-10-CM | POA: Diagnosis not present

## 2021-09-19 DIAGNOSIS — E669 Obesity, unspecified: Secondary | ICD-10-CM | POA: Diagnosis not present

## 2021-09-19 DIAGNOSIS — I10 Essential (primary) hypertension: Secondary | ICD-10-CM | POA: Diagnosis not present

## 2021-09-20 ENCOUNTER — Telehealth (HOSPITAL_BASED_OUTPATIENT_CLINIC_OR_DEPARTMENT_OTHER): Payer: Self-pay

## 2021-09-20 DIAGNOSIS — E871 Hypo-osmolality and hyponatremia: Secondary | ICD-10-CM

## 2021-09-20 LAB — COMPREHENSIVE METABOLIC PANEL
ALT: 8 IU/L (ref 0–32)
AST: 19 IU/L (ref 0–40)
Albumin/Globulin Ratio: 2 (ref 1.2–2.2)
Albumin: 4.7 g/dL (ref 3.7–4.7)
Alkaline Phosphatase: 94 IU/L (ref 44–121)
BUN/Creatinine Ratio: 13 (ref 12–28)
BUN: 11 mg/dL (ref 8–27)
Bilirubin Total: 0.5 mg/dL (ref 0.0–1.2)
CO2: 25 mmol/L (ref 20–29)
Calcium: 9.8 mg/dL (ref 8.7–10.3)
Chloride: 92 mmol/L — ABNORMAL LOW (ref 96–106)
Creatinine, Ser: 0.82 mg/dL (ref 0.57–1.00)
Globulin, Total: 2.4 g/dL (ref 1.5–4.5)
Glucose: 103 mg/dL — ABNORMAL HIGH (ref 70–99)
Potassium: 4 mmol/L (ref 3.5–5.2)
Sodium: 130 mmol/L — ABNORMAL LOW (ref 134–144)
Total Protein: 7.1 g/dL (ref 6.0–8.5)
eGFR: 76 mL/min/{1.73_m2} (ref >59)

## 2021-09-20 LAB — CBC WITH DIFFERENTIAL/PLATELET
Basophils Absolute: 0.1 10*3/uL (ref 0.0–0.2)
Basos: 1 %
EOS (ABSOLUTE): 0.2 10*3/uL (ref 0.0–0.4)
Eos: 3 %
Hematocrit: 35 % (ref 34.0–46.6)
Hemoglobin: 11.7 g/dL (ref 11.1–15.9)
Immature Grans (Abs): 0 10*3/uL (ref 0.0–0.1)
Immature Granulocytes: 0 %
Lymphocytes Absolute: 2.9 10*3/uL (ref 0.7–3.1)
Lymphs: 40 %
MCH: 28.9 pg (ref 26.6–33.0)
MCHC: 33.4 g/dL (ref 31.5–35.7)
MCV: 86 fL (ref 79–97)
Monocytes Absolute: 0.6 10*3/uL (ref 0.1–0.9)
Monocytes: 8 %
Neutrophils Absolute: 3.5 10*3/uL (ref 1.4–7.0)
Neutrophils: 48 %
Platelets: 295 10*3/uL (ref 150–450)
RBC: 4.05 x10E6/uL (ref 3.77–5.28)
RDW: 12.8 % (ref 11.7–15.4)
WBC: 7.3 10*3/uL (ref 3.4–10.8)

## 2021-09-20 LAB — LIPID PANEL
Chol/HDL Ratio: 3.7 ratio (ref 0.0–4.4)
Cholesterol, Total: 171 mg/dL (ref 100–199)
HDL: 46 mg/dL (ref >39)
LDL Chol Calc (NIH): 105 mg/dL — ABNORMAL HIGH (ref 0–99)
Triglycerides: 113 mg/dL (ref 0–149)
VLDL Cholesterol Cal: 20 mg/dL (ref 5–40)

## 2021-09-20 LAB — HEMOGLOBIN A1C
Est. average glucose Bld gHb Est-mCnc: 134 mg/dL
Hgb A1c MFr Bld: 6.3 % — ABNORMAL HIGH (ref 4.8–5.6)

## 2021-09-23 ENCOUNTER — Other Ambulatory Visit (HOSPITAL_BASED_OUTPATIENT_CLINIC_OR_DEPARTMENT_OTHER): Payer: Self-pay

## 2021-09-23 ENCOUNTER — Ambulatory Visit (HOSPITAL_BASED_OUTPATIENT_CLINIC_OR_DEPARTMENT_OTHER): Payer: Medicare Other

## 2021-10-01 ENCOUNTER — Other Ambulatory Visit (HOSPITAL_BASED_OUTPATIENT_CLINIC_OR_DEPARTMENT_OTHER): Payer: Self-pay | Admitting: Nurse Practitioner

## 2021-10-01 DIAGNOSIS — E871 Hypo-osmolality and hyponatremia: Secondary | ICD-10-CM | POA: Diagnosis not present

## 2021-10-02 LAB — COMPREHENSIVE METABOLIC PANEL
ALT: 10 IU/L (ref 0–32)
AST: 18 IU/L (ref 0–40)
Albumin/Globulin Ratio: 2 (ref 1.2–2.2)
Albumin: 4.8 g/dL — ABNORMAL HIGH (ref 3.7–4.7)
Alkaline Phosphatase: 93 IU/L (ref 44–121)
BUN/Creatinine Ratio: 10 — ABNORMAL LOW (ref 12–28)
BUN: 9 mg/dL (ref 8–27)
Bilirubin Total: 0.4 mg/dL (ref 0.0–1.2)
CO2: 24 mmol/L (ref 20–29)
Calcium: 9.9 mg/dL (ref 8.7–10.3)
Chloride: 93 mmol/L — ABNORMAL LOW (ref 96–106)
Creatinine, Ser: 0.87 mg/dL (ref 0.57–1.00)
Globulin, Total: 2.4 g/dL (ref 1.5–4.5)
Glucose: 124 mg/dL — ABNORMAL HIGH (ref 70–99)
Potassium: 3.9 mmol/L (ref 3.5–5.2)
Sodium: 132 mmol/L — ABNORMAL LOW (ref 134–144)
Total Protein: 7.2 g/dL (ref 6.0–8.5)
eGFR: 71 mL/min/{1.73_m2} (ref >59)

## 2021-10-02 LAB — ACTH: ACTH: 12.1 pg/mL (ref 7.2–63.3)

## 2021-10-02 LAB — CORTISOL: Cortisol: 8.3 ug/dL

## 2021-10-02 LAB — PRO B NATRIURETIC PEPTIDE: NT-Pro BNP: <36 pg/mL (ref 0–301)

## 2021-10-10 ENCOUNTER — Other Ambulatory Visit (HOSPITAL_BASED_OUTPATIENT_CLINIC_OR_DEPARTMENT_OTHER): Payer: Self-pay | Admitting: Nurse Practitioner

## 2021-10-10 DIAGNOSIS — I10 Essential (primary) hypertension: Secondary | ICD-10-CM

## 2021-10-10 MED ORDER — LOSARTAN POTASSIUM 50 MG PO TABS: 50.0000 mg | ORAL_TABLET | Freq: Every day | ORAL | 3 refills | Status: DC

## 2021-11-01 DIAGNOSIS — K648 Other hemorrhoids: Secondary | ICD-10-CM | POA: Diagnosis not present

## 2021-11-01 DIAGNOSIS — D122 Benign neoplasm of ascending colon: Secondary | ICD-10-CM | POA: Diagnosis not present

## 2021-11-01 DIAGNOSIS — K635 Polyp of colon: Secondary | ICD-10-CM | POA: Diagnosis not present

## 2021-11-01 DIAGNOSIS — Z79899 Other long term (current) drug therapy: Secondary | ICD-10-CM | POA: Diagnosis not present

## 2021-11-01 DIAGNOSIS — C181 Malignant neoplasm of appendix: Secondary | ICD-10-CM | POA: Diagnosis not present

## 2021-11-01 DIAGNOSIS — Z1211 Encounter for screening for malignant neoplasm of colon: Secondary | ICD-10-CM | POA: Diagnosis not present

## 2021-11-18 IMAGING — CT CT ABD-PELV W/ CM
2 of 5 series · 16 of 46 positions shown, 18 images · IV contrast (APPLIED)
Comparison: CT abdomen pelvis 04/15/2021

CLINICAL DATA: Right lower quadrant pain.  Worsened

EXAM:
CT ABDOMEN AND PELVIS WITH CONTRAST
TECHNIQUE: Multidetector CT imaging of the abdomen and pelvis was performed
using the standard protocol following bolus administration of
intravenous contrast.
CONTRAST:  100mL OMNIPAQUE IOHEXOL 350 MG/ML SOLN

[Series 3: abd/ pelvis 5.0 i30f 2 · axial · 0.89mm/px · z∈[+784,+1134]mm · 13 of 80 slices shown, 15 images]
[im 5/80  soft-tissue]
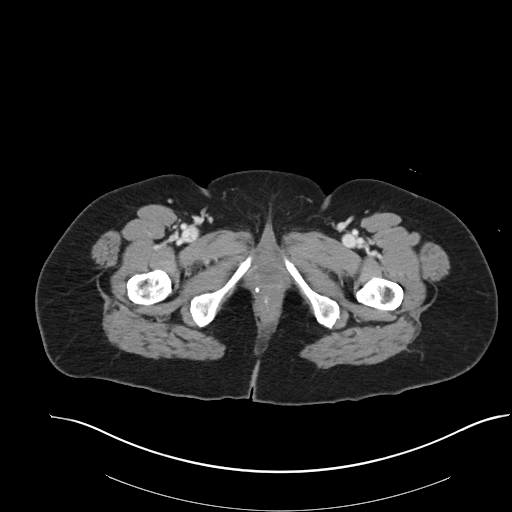
[im 5/80  bone]
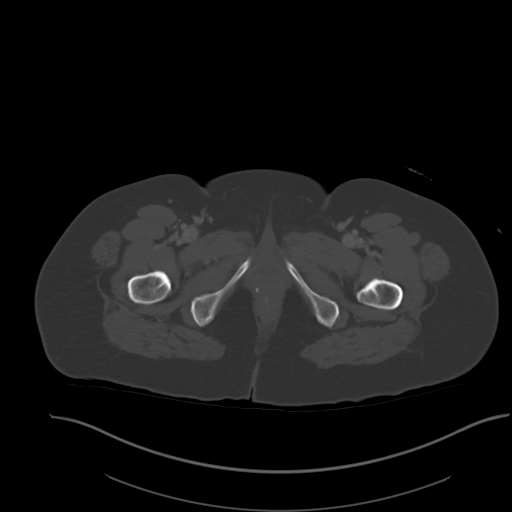
[im 13/80  soft-tissue]
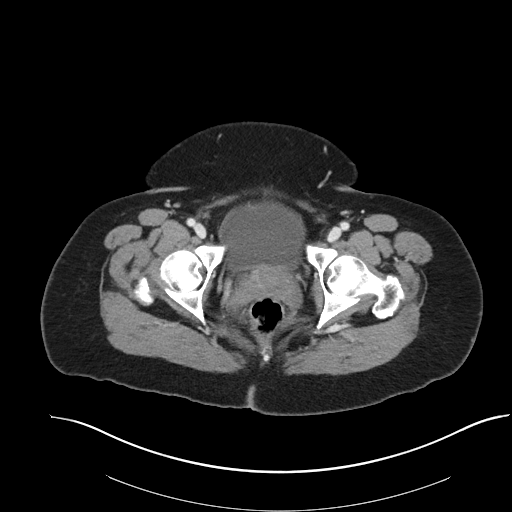
[im 17/80  soft-tissue]
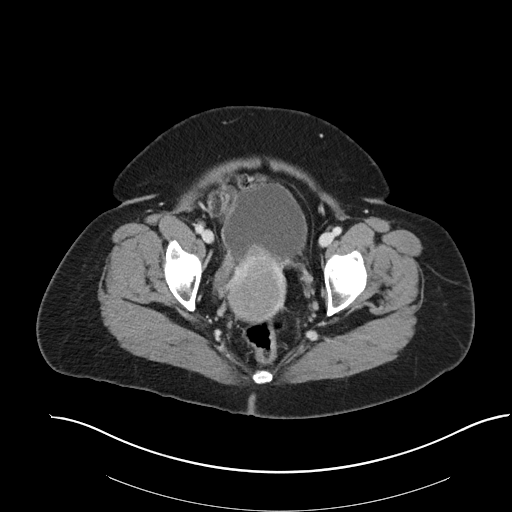
[im 21/80  soft-tissue]
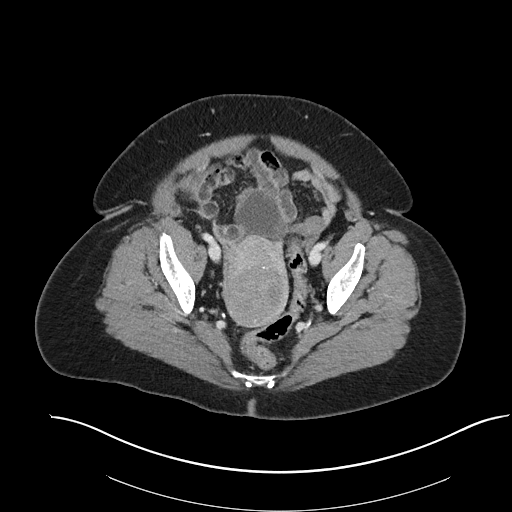
[im 30/80  soft-tissue]
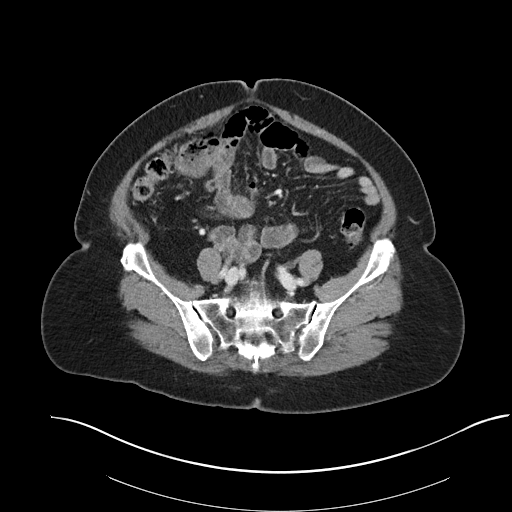
[im 34/80  soft-tissue]
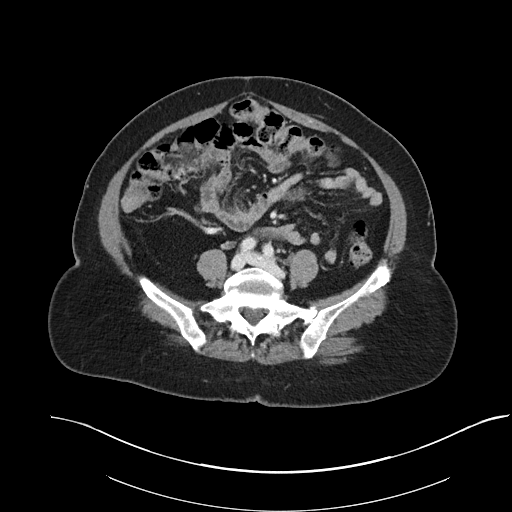
[im 42/80  soft-tissue]
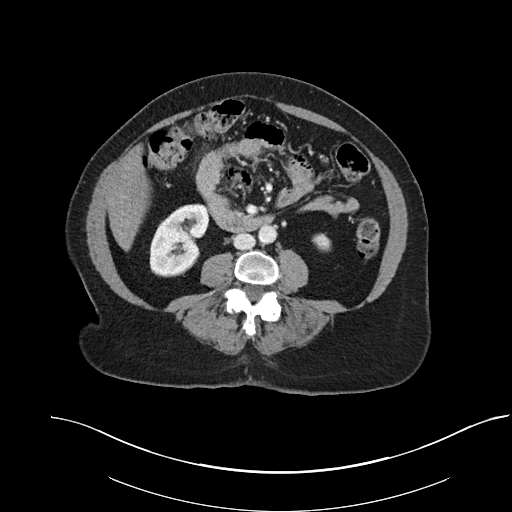
[im 46/80  soft-tissue]
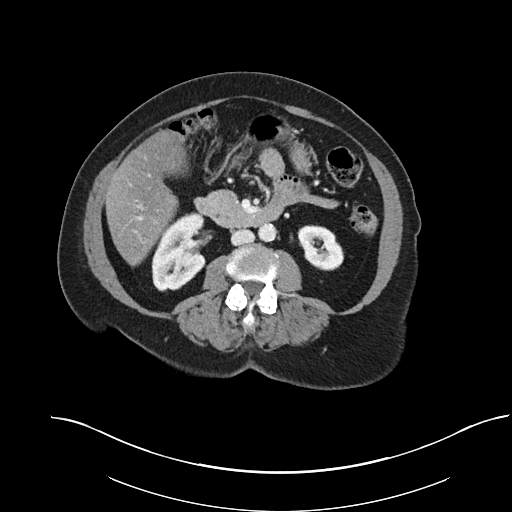
[im 50/80  soft-tissue]
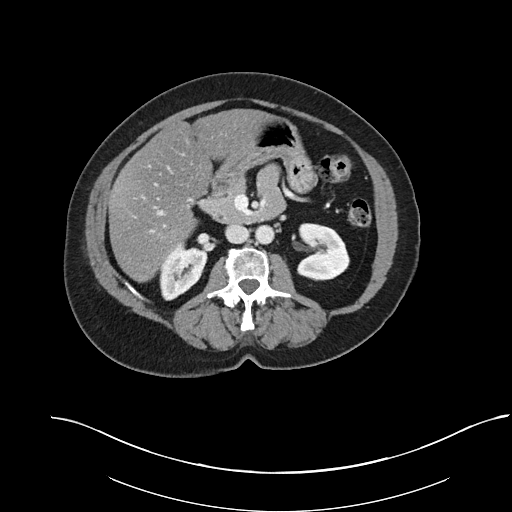
[im 50/80  bone]
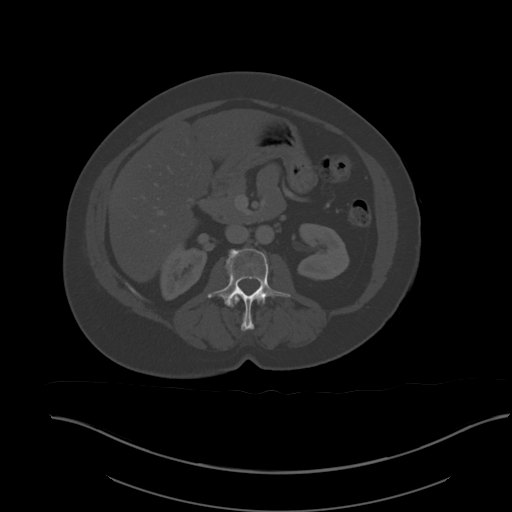
[im 59/80  soft-tissue]
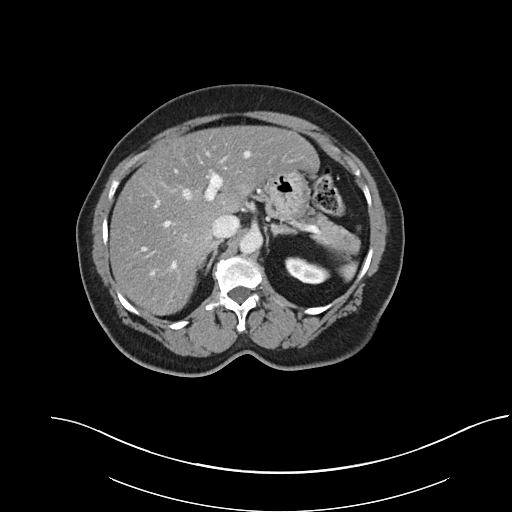
[im 63/80  soft-tissue]
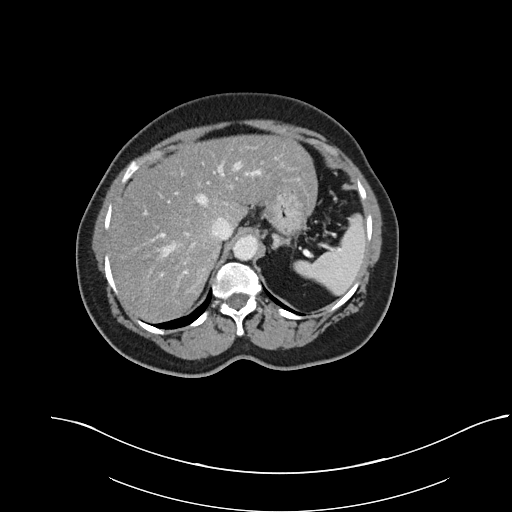
[im 67/80  soft-tissue]
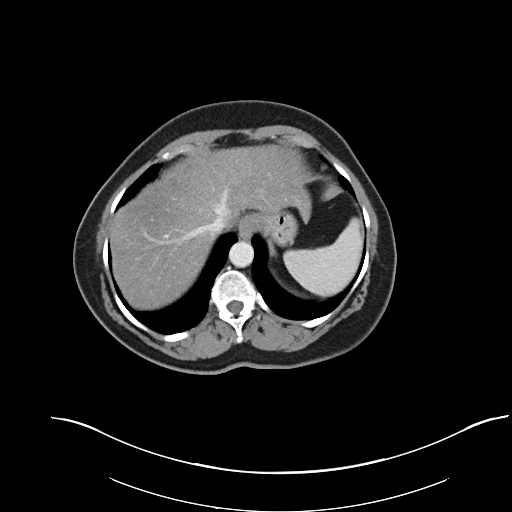
[im 75/80  soft-tissue]
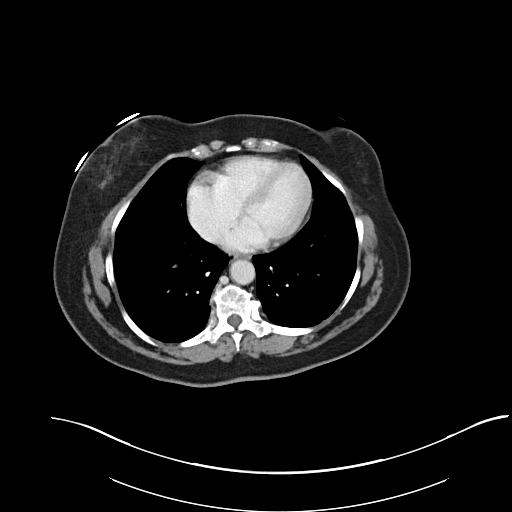

[Series 6: coronal soft tissue · coronal · 0.75mm/px · 3 of 99 slices shown]
[im 33/99  soft-tissue]
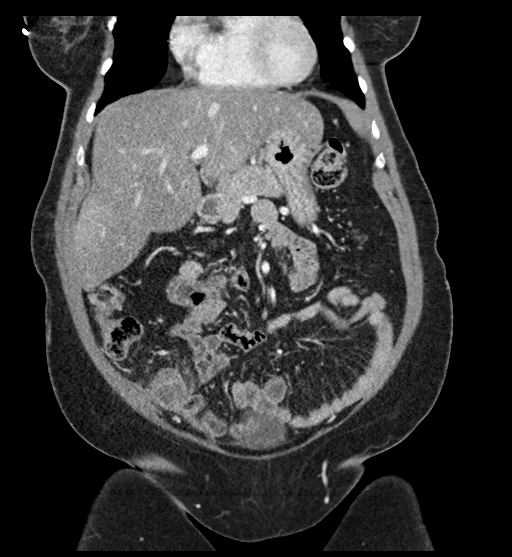
[im 44/99  soft-tissue]
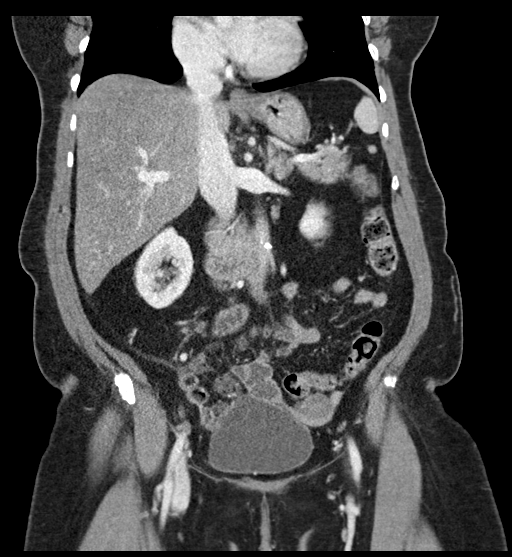
[im 55/99  soft-tissue]
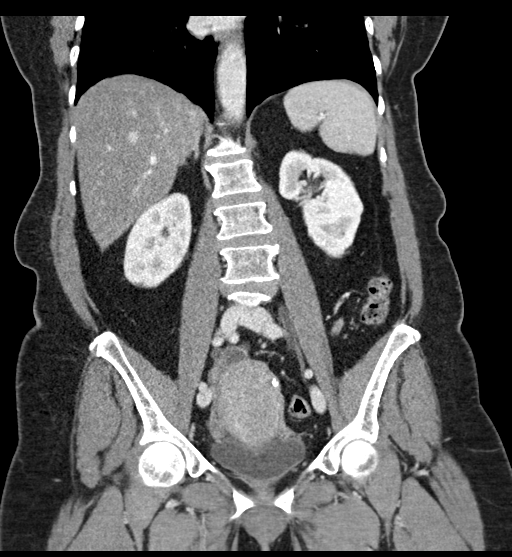

[16 of 46 positions shown; findings below may reference images not displayed]

FINDINGS: Lower chest: No acute abnormality.

Hepatobiliary: The hepatic parenchyma is diffusely hypodense
compared to the splenic parenchyma consistent with fatty
infiltration. No focal liver abnormality. The gallbladder is not
visualized and is likely surgically absent. No biliary dilatation.

Pancreas: No focal lesion. Normal pancreatic contour. No surrounding
inflammatory changes. No main pancreatic ductal dilatation.

Spleen: Normal in size without focal abnormality.

Adrenals/Urinary Tract:

No adrenal nodule bilaterally.

Bilateral kidneys enhance symmetrically.

No hydronephrosis. No hydroureter.

The urinary bladder is unremarkable.

Stomach/Bowel: Stomach is within normal limits. No evidence of bowel
wall thickening or dilatation. Scattered colonic diverticulosis. The
tip of the appendix is again noted to be enlarged in caliber
measuring up to 23 mm. Interval development of periappendiceal fat
stranding and appendiceal wall thickening. No definite appendiceal
wall discontinuity. The appendix is noted to course posteroinferior
to the cecum.

Vascular/Lymphatic: No abdominal aorta or iliac aneurysm. Mild
atherosclerotic plaque of the aorta and its branches. No abdominal,
pelvic, or inguinal lymphadenopathy.

Reproductive: There is a 5.5 cm heterogeneous slightly hypodense
lesion within the posterior wall of the uterus. Subcentimeter
calcified lesions are also noted within the uterine wall. Otherwise
the uterus and bilateral adnexa are unremarkable.

Other: No intraperitoneal free fluid. No intraperitoneal free gas.
No organized fluid collection.

Musculoskeletal:

No abdominal wall hernia or abnormality.

No suspicious lytic or blastic osseous lesions. No acute displaced
fracture.
IMPRESSION: 1. Interval development of non-perforated acute tip appendicitis.
Underlying mucocele or malignancy is not excluded. Recommend
surgical consultation.
2. Scattered colonic diverticulosis with no acute diverticulitis.
3. Hepatic steatosis.
4. Uterine fibroids with the largest measuring up to 5.5 cm.
5.  Aortic Atherosclerosis ([id number]).

## 2021-11-25 DIAGNOSIS — E119 Type 2 diabetes mellitus without complications: Secondary | ICD-10-CM | POA: Diagnosis not present

## 2021-11-25 DIAGNOSIS — R978 Other abnormal tumor markers: Secondary | ICD-10-CM | POA: Diagnosis not present

## 2021-11-25 DIAGNOSIS — D378 Neoplasm of uncertain behavior of other specified digestive organs: Secondary | ICD-10-CM | POA: Diagnosis not present

## 2021-11-25 DIAGNOSIS — E785 Hyperlipidemia, unspecified: Secondary | ICD-10-CM | POA: Diagnosis not present

## 2021-11-25 DIAGNOSIS — C181 Malignant neoplasm of appendix: Secondary | ICD-10-CM | POA: Diagnosis not present

## 2021-11-25 DIAGNOSIS — I10 Essential (primary) hypertension: Secondary | ICD-10-CM | POA: Diagnosis not present

## 2021-12-09 ENCOUNTER — Encounter (HOSPITAL_BASED_OUTPATIENT_CLINIC_OR_DEPARTMENT_OTHER): Payer: Self-pay | Admitting: Nurse Practitioner

## 2021-12-09 ENCOUNTER — Ambulatory Visit (INDEPENDENT_AMBULATORY_CARE_PROVIDER_SITE_OTHER): Payer: Medicare Other | Admitting: Nurse Practitioner

## 2021-12-09 VITALS — BP 117/72 | HR 86 | Ht <= 58 in | Wt 151.8 lb

## 2021-12-09 DIAGNOSIS — I10 Essential (primary) hypertension: Secondary | ICD-10-CM

## 2021-12-09 DIAGNOSIS — E1142 Type 2 diabetes mellitus with diabetic polyneuropathy: Secondary | ICD-10-CM | POA: Diagnosis not present

## 2021-12-09 DIAGNOSIS — E785 Hyperlipidemia, unspecified: Secondary | ICD-10-CM | POA: Diagnosis not present

## 2021-12-09 DIAGNOSIS — E1169 Type 2 diabetes mellitus with other specified complication: Secondary | ICD-10-CM | POA: Diagnosis not present

## 2021-12-09 MED ORDER — LOSARTAN POTASSIUM 50 MG PO TABS: 50.0000 mg | ORAL_TABLET | Freq: Every day | ORAL | 3 refills | Status: DC

## 2021-12-09 MED ORDER — PRAVASTATIN SODIUM 20 MG PO TABS: 20.0000 mg | ORAL_TABLET | Freq: Every day | ORAL | 3 refills | Status: DC

## 2021-12-09 MED ORDER — TIRZEPATIDE 2.5 MG/0.5ML ~~LOC~~ SOAJ: 2.5000 mg | SUBCUTANEOUS | 0 refills | Status: DC

## 2021-12-09 NOTE — Assessment & Plan Note (Signed)
Chronic.  Currently interested in trying alternative medication to metformin as this is causing some gastrointestinal distress.  Discussed the option of injectable GLP-1 medications which can be helpful for weight loss as well.  She is interested in this today.  We will send prescription for [facility] and work to get prior authorization.  Recommend continuing with metformin while waiting for prior authorization and during the first 4 weeks of medication start until reaching therapeutic dose of Mounjaro at week 5.  Education on injections today however discussed with patient that once she gets medication she may come into the office and we can provide her with the first injection to show her how it is done.  She plans to do that.  She will follow-up with any concerns or new symptoms. ?

## 2021-12-09 NOTE — Assessment & Plan Note (Signed)
Chronic.  On statin therapy.  In the setting of diabetes and hypertension recommend very close monitoring and dietary control. ?We will obtain labs today. ?

## 2021-12-09 NOTE — Progress Notes (Signed)
?Kimberly Keeler, DNP, AGNP-c ?[facility] ?[address] ?[address] ?[place], [place] [address] ?[phone] Office [phone] Fax ? ?ESTABLISHED PATIENT- Chronic Health and/or Follow-Up Visit ? ?Blood pressure 117/72, pulse 86, height '4\' 10"'$  (1.473 m), weight 151 lb 12.8 oz (68.9 kg), last menstrual period 08/05/2003, SpO2 97 %. ? ?No chief complaint on file. ? ? ?HPI ? ?Kimberly Welch  is a 71 y.o. 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 ?HTN ?Taking losartan ?No symptoms of CP, ShOB, palpitations, dizziness, HA ?DM ?Wants to try something different for blood sugar control ?Some loose stool with metformin ?Would like to discuss options that can also help with weight loss.  ? ?HLD ?Taking pravastatin ?No SE of medication ?Asthma ?Using albuterol ?No alarm sx present.  ?Some increased need due to allergies, but otherwise stable.  ? ?ROS ?All ROS negative with exception of what is listed in HPI ? ?PHYSICAL EXAM ?Physical Exam ?Vitals and nursing note reviewed.  ?Constitutional:   ?   General: She is not in acute distress. ?   Appearance: Normal appearance.  ?HENT:  ?   Head: Normocephalic.  ?Eyes:  ?   Extraocular Movements: Extraocular movements intact.  ?   Conjunctiva/sclera: Conjunctivae normal.  ?   Pupils: Pupils are equal, round, and reactive to light.  ?Neck:  ?   Vascular: No carotid bruit.  ?Cardiovascular:  ?   Rate and Rhythm: Normal rate and regular rhythm.  ?   Pulses: Normal pulses.  ?   Heart sounds: Normal heart sounds. No murmur heard. ?Pulmonary:  ?   Effort: Pulmonary effort is normal.  ?   Breath sounds: Normal breath sounds. No wheezing.  ?Abdominal:  ?   General: Bowel sounds are normal. There is no distension.  ?   Palpations: Abdomen is soft.  ?   Tenderness: There is no abdominal tenderness. There is no guarding.  ?Musculoskeletal:     ?    General: Normal range of motion.  ?   Cervical back: Normal range of motion and neck supple.  ?   Right lower leg: No edema.  ?   Left lower leg: No edema.  ?Lymphadenopathy:  ?   Cervical: No cervical adenopathy.  ?Skin: ?   General: Skin is warm and dry.  ?   Capillary Refill: Capillary refill takes less than 2 seconds.  ?Neurological:  ?   General: No focal deficit present.  ?   Mental Status: She is alert and oriented to person, place, and time.  ?Psychiatric:     ?   Mood and Affect: Mood normal.     ?   Behavior: Behavior normal.     ?   Thought Content: Thought content normal.     ?   Judgment: Judgment normal.  ? ? ?ASSESSMENT & PLAN ?Problem List Items Addressed This Visit   ? ? Essential hypertension (Chronic)  ?  Chronic.  Stable.  Blood pressure well controlled today.  No alarm symptoms present at this time.  We will send refills today and obtain labs for further evaluation today.  Recommend follow-up if symptoms worsen or new symptoms develop. ? ?  ?  ? Relevant Medications  ? tirzepatide (MOUNJARO) 2.5 MG/0.5ML Pen  ? losartan (COZAAR) 50 MG tablet  ? pravastatin (PRAVACHOL) 20 MG tablet  ? Other Relevant Orders  ? CBC with Differential/Platelet  ? Comprehensive  metabolic panel  ? Type 2 diabetes mellitus ([place]) - Primary  ?  Chronic.  Currently interested in trying alternative medication to metformin as this is causing some gastrointestinal distress.  Discussed the option of injectable GLP-1 medications which can be helpful for weight loss as well.  She is interested in this today.  We will send prescription for [facility] and work to get prior authorization.  Recommend continuing with metformin while waiting for prior authorization and during the first 4 weeks of medication start until reaching therapeutic dose of Mounjaro at week 5.  Education on injections today however discussed with patient that once she gets medication she may come into the office and we can provide her with the first injection to  show her how it is done.  She plans to do that.  She will follow-up with any concerns or new symptoms. ? ?  ?  ? Relevant Medications  ? tirzepatide (MOUNJARO) 2.5 MG/0.5ML Pen  ? losartan (COZAAR) 50 MG tablet  ? pravastatin (PRAVACHOL) 20 MG tablet  ? Other Relevant Orders  ? CBC with Differential/Platelet  ? Comprehensive metabolic panel  ? Hemoglobin A1c  ? Hyperlipidemia associated with type 2 diabetes mellitus ([place])  ?  Chronic.  On statin therapy.  In the setting of diabetes and hypertension recommend very close monitoring and dietary control. ?We will obtain labs today. ? ?  ?  ? Relevant Medications  ? tirzepatide (MOUNJARO) 2.5 MG/0.5ML Pen  ? losartan (COZAAR) 50 MG tablet  ? pravastatin (PRAVACHOL) 20 MG tablet  ? Other Relevant Orders  ? CBC with Differential/Platelet  ? Comprehensive metabolic panel  ? ? ? ?FOLLOW-UP ?Return in about 3 months (around 03/11/2022) for to check on new diabetes medication. ? ? ?Kimberly Keeler, DNP, AGNP-c ?12/09/2021  2:01 PM ?

## 2021-12-09 NOTE — Assessment & Plan Note (Signed)
Chronic.  Stable.  Blood pressure well controlled today.  No alarm symptoms present at this time.  We will send refills today and obtain labs for further evaluation today.  Recommend follow-up if symptoms worsen or new symptoms develop. ?

## 2021-12-09 NOTE — Patient Instructions (Signed)
It was a pleasure seeing you today. I hope your time spent with [place] was pleasant and helpful. Please let us know if there is anything we can do to improve the service you receive.  ? ?I am going to send the new medication for diabetes in to the pharmacy and see if we can get insurance coverage. I am looking at three different options, Mounjaro, Ozempic, or Trulicity. Hopefully we can get approval on one of these :-) ? ?I will let you know if your labs show any concerns.  ? ?You are looking great!! I am so happy about your recent news on the cancer!! ? ? ?Important Office Information ?Lab Results ?If labs were ordered, please note that you will see results through [place] as soon as they come available from [facility].  ?It takes up to 5 business days for the results to be routed to me and for me to review them once all of the lab results have come through from [facility]. I will make recommendations based on your results and send these through [place] or someone from the office will call you to discuss. If your labs are abnormal, we may contact you to schedule a visit to discuss the results and make recommendations.  ?If you have not heard from [place] within 5 business days or you have waited longer than a week and your lab results have not come through on [place], please feel free to call the office or send a message through [place] to follow-up on these labs.  ? ?Referrals ?If referrals were placed today, the office where the referral was sent will contact you either by phone or through [place] to set up scheduling. Please note that it can take up to a week for the referral office to contact you. If you do not hear from them in a week, please contact the referral office directly to inquire about scheduling.  ? ?Condition Treated ?If your condition worsens or you begin to have new symptoms, please schedule a follow-up appointment for further evaluation. If you are not sure if an appointment is needed, you may call the office  to leave a message for the nurse and someone will contact you with recommendations.  ?If you have an urgent or life threatening emergency, please do not call the office, but seek emergency evaluation by calling 911 or going to the nearest emergency room for evaluation.  ? ?MyChart and Phone Calls ?Please do not use MyChart for urgent messages. It may take up to 3 business days for MyChart messages to be read by staff and if they are unable to handle the request, an additional 3 business days for them to be routed to me and for my response.  ?Messages sent to the provider through [place] do not come directly to the provider, please allow time for these messages to be routed and for me to respond.  ?We get a large volume of MyChart messages daily and these are responded to in the order received.  ? ?For urgent messages, please call the office at [phone] and speak with the front office staff or leave a message on the line of my assistant for guidance.  ?We are seeing patients from the hours of 8:00 am through 5:00 pm and calls directly to the nurse may not be answered immediately due to seeing patients, but your call will be returned as soon as possible.  ?Phone  messages received after 4:00 PM Monday through Thursday may not be returned until the  following business day. Phone messages received after 11:00 AM on Friday may not be returned until Monday.  ? ?After Hours ?We share on call hours with providers from other offices. If you have an urgent need after hours that cannot wait until the next business day, please contact the on call provider by calling the office number. A nurse will speak with you and contact the provider if needed for recommendations.  ?If you have an urgent or life threatening emergency after hours, please do not call the on call provider, but seek emergency evaluation by calling 911 or going to the nearest emergency room for evaluation.  ? ?Paperwork ?All paperwork requires a minimum of 5  days to complete and return to you or the designated personnel. Please keep this in mind when bringing in forms or sending requests for paperwork completion to the office.  ?  ?

## 2021-12-10 LAB — CBC WITH DIFFERENTIAL/PLATELET
Basophils Absolute: 0 10*3/uL (ref 0.0–0.2)
Basos: 1 %
EOS (ABSOLUTE): 0.2 10*3/uL (ref 0.0–0.4)
Eos: 2 %
Hematocrit: 35.8 % (ref 34.0–46.6)
Hemoglobin: 11.7 g/dL (ref 11.1–15.9)
Immature Grans (Abs): 0 10*3/uL (ref 0.0–0.1)
Immature Granulocytes: 0 %
Lymphocytes Absolute: 3.6 10*3/uL — ABNORMAL HIGH (ref 0.7–3.1)
Lymphs: 41 %
MCH: 28 pg (ref 26.6–33.0)
MCHC: 32.7 g/dL (ref 31.5–35.7)
MCV: 86 fL (ref 79–97)
Monocytes Absolute: 0.7 10*3/uL (ref 0.1–0.9)
Monocytes: 8 %
Neutrophils Absolute: 4.2 10*3/uL (ref 1.4–7.0)
Neutrophils: 48 %
Platelets: 279 10*3/uL (ref 150–450)
RBC: 4.18 x10E6/uL (ref 3.77–5.28)
RDW: 12.9 % (ref 11.7–15.4)
WBC: 8.7 10*3/uL (ref 3.4–10.8)

## 2021-12-10 LAB — COMPREHENSIVE METABOLIC PANEL
ALT: 10 IU/L (ref 0–32)
AST: 18 IU/L (ref 0–40)
Albumin/Globulin Ratio: 1.5 (ref 1.2–2.2)
Albumin: 4.6 g/dL (ref 3.7–4.7)
Alkaline Phosphatase: 97 IU/L (ref 44–121)
BUN/Creatinine Ratio: 15 (ref 12–28)
BUN: 12 mg/dL (ref 8–27)
Bilirubin Total: 0.4 mg/dL (ref 0.0–1.2)
CO2: 25 mmol/L (ref 20–29)
Calcium: 10 mg/dL (ref 8.7–10.3)
Chloride: 94 mmol/L — ABNORMAL LOW (ref 96–106)
Creatinine, Ser: 0.8 mg/dL (ref 0.57–1.00)
Globulin, Total: 3 g/dL (ref 1.5–4.5)
Glucose: 94 mg/dL (ref 70–99)
Potassium: 3.9 mmol/L (ref 3.5–5.2)
Sodium: 134 mmol/L (ref 134–144)
Total Protein: 7.6 g/dL (ref 6.0–8.5)
eGFR: 79 mL/min/{1.73_m2} (ref >59)

## 2021-12-10 LAB — HEMOGLOBIN A1C
Est. average glucose Bld gHb Est-mCnc: 137 mg/dL
Hgb A1c MFr Bld: 6.4 % — ABNORMAL HIGH (ref 4.8–5.6)

## 2021-12-11 ENCOUNTER — Telehealth (HOSPITAL_BASED_OUTPATIENT_CLINIC_OR_DEPARTMENT_OTHER): Payer: Self-pay | Admitting: Nurse Practitioner

## 2021-12-11 NOTE — Telephone Encounter (Signed)
Receiveed fax transmission from pt's ins company on 5/9 denying coverage for tirzepatide [facility]) 2.5 MG/0.5ML Pen. Documents will be in provider's yellow dot tray. ?

## 2022-01-06 ENCOUNTER — Telehealth (HOSPITAL_BASED_OUTPATIENT_CLINIC_OR_DEPARTMENT_OTHER): Payer: Self-pay

## 2022-01-06 NOTE — Telephone Encounter (Signed)
Patient states discussing with provider discontinuing metformin and beginning new prescription. Prior auth required for prescriptions sent it. Please assist.

## 2022-01-16 NOTE — Telephone Encounter (Signed)
Updated communication required

## 2022-03-11 ENCOUNTER — Ambulatory Visit (INDEPENDENT_AMBULATORY_CARE_PROVIDER_SITE_OTHER): Payer: Medicare Other | Admitting: Nurse Practitioner

## 2022-03-11 ENCOUNTER — Encounter (HOSPITAL_BASED_OUTPATIENT_CLINIC_OR_DEPARTMENT_OTHER): Payer: Self-pay | Admitting: Nurse Practitioner

## 2022-03-11 ENCOUNTER — Other Ambulatory Visit (HOSPITAL_BASED_OUTPATIENT_CLINIC_OR_DEPARTMENT_OTHER): Payer: Self-pay

## 2022-03-11 VITALS — BP 130/78 | HR 75 | Temp 98.7°F | Ht 59.0 in | Wt 154.0 lb

## 2022-03-11 DIAGNOSIS — E785 Hyperlipidemia, unspecified: Secondary | ICD-10-CM

## 2022-03-11 DIAGNOSIS — I10 Essential (primary) hypertension: Secondary | ICD-10-CM

## 2022-03-11 DIAGNOSIS — E1169 Type 2 diabetes mellitus with other specified complication: Secondary | ICD-10-CM | POA: Diagnosis not present

## 2022-03-11 DIAGNOSIS — E1142 Type 2 diabetes mellitus with diabetic polyneuropathy: Secondary | ICD-10-CM

## 2022-03-11 LAB — POCT UA - MICROALBUMIN
Albumin/Creatinine Ratio, Urine, POC: <30
Creatinine, POC: 200 mg/dL
Microalbumin Ur, POC: 10 mg/L

## 2022-03-11 MED ORDER — ZOSTER VAC RECOMB ADJUVANTED 50 MCG/0.5ML IM SUSR
0.5000 mL | Freq: Once | INTRAMUSCULAR | 1 refills | Status: AC
Start: 2022-03-11 — End: 2022-03-12
  Filled 2022-03-11: qty 0.5, 1d supply, fill #0

## 2022-03-11 NOTE — Patient Instructions (Addendum)
For now we will continue with the metformin. We will plan to check labs at your next visit.   You can go down to the pharmacy for your shingles vaccine.   We will plan to follow-up in 6 months to recheck labs.

## 2022-03-11 NOTE — Progress Notes (Unsigned)
Kimberly Keeler, DNP, AGNP-c [facility] [address] [place], [place] [address] [phone] Office [phone] Fax  ESTABLISHED PATIENT- Chronic Health and/or Follow-Up Visit  Blood pressure 130/78, pulse 75, temperature 98.7 F (37.1 C), height '4\' 11"'$  (1.499 m), weight 154 lb (69.9 kg), last menstrual period 08/05/2003, SpO2 99 %.  Follow-up (Patient presents today for 3 month follow up. She never got the [facility] as it was denied. She feels the metformin is working well for her. She would like shingles vaccine. )   HPI  Kimberly Welch  is a 71 y.o. year old female presenting today for evaluation and management of the following: DM She is tolerating the medication well. She would like to ask her oncologist if they have any recommendations for a different medication aside from metformin. She had considered mounjaro but was concerned when she learned of cancer risks. She would like to make sure the oncologist approves of other medications before she considers a change.  She hasnt been checking her blood sugars regularly.  She tells me that every once in a while when she is stressed out she will feel her heart beating in her chest. This only happens when she is stressed.  She is not having any CP, dizziness, ShOB, LE edema, or vision changes She has noticed some sinus headaches recently, but nothing daily.   ROS All ROS negative with exception of what is listed in HPI  PHYSICAL EXAM Physical Exam Vitals and nursing note reviewed.  Constitutional:      General: She is not in acute distress.    Appearance: Normal appearance.  HENT:     Head: Normocephalic.  Eyes:     Extraocular Movements: Extraocular movements intact.     Conjunctiva/sclera: Conjunctivae normal.     Pupils: Pupils are equal, round, and reactive to light.  Neck:     Vascular: No carotid bruit.  Cardiovascular:     Rate and Rhythm: Normal rate and regular rhythm.      Pulses: Normal pulses.     Heart sounds: Normal heart sounds. No murmur heard. Pulmonary:     Effort: Pulmonary effort is normal.     Breath sounds: Normal breath sounds. No wheezing.  Abdominal:     General: Bowel sounds are normal. There is no distension.     Palpations: Abdomen is soft.     Tenderness: There is no abdominal tenderness. There is no guarding.  Musculoskeletal:        General: Normal range of motion.     Cervical back: Normal range of motion and neck supple.     Right lower leg: No edema.     Left lower leg: No edema.  Lymphadenopathy:     Cervical: No cervical adenopathy.  Skin:    General: Skin is warm and dry.     Capillary Refill: Capillary refill takes less than 2 seconds.  Neurological:     General: No focal deficit present.     Mental Status: She is alert and oriented to person, place, and time.  Psychiatric:        Mood and Affect: Mood normal.        Behavior: Behavior normal.        Thought Content: Thought content normal.        Judgment: Judgment normal.     ASSESSMENT & PLAN Problem List Items Addressed This Visit     Essential hypertension - Primary (Chronic)    Chronic.  Blood pressure initially elevated today however repeat check is within normal limits.  She is not having the alarm symptoms present at this time.  Will plan to obtain labs for evaluation for urine microalbumin.  Previous labs reviewed today.  Will plan to follow-up in approximately 3 months or sooner if needed.      Type 2 diabetes mellitus (HCC)    Chronic.  No alarm symptoms or concerns present today.  Patient is not checking blood sugars however her most recent A1c was improved.  She did not start Mounjaro after recent prescription initially because medication was not approved by insurance however once it was approved she reports she had concerns with the possibility of carcinoma which is understandable.  She would like to discuss other options for her blood sugar with her  oncologist and plans to do that at her next visit.  At this time we will plan to continue with the metformin as she is tolerating this well and her blood sugars more recently improved.  We will plan to follow-up in approximately 3 months with labs or sooner if needed.      Relevant Orders   POCT UA - Microalbumin (Completed)   Hyperlipidemia associated with type 2 diabetes mellitus (HCC)    Chronic.  Currently on statin therapy.  Previous labs reviewed today.  No changes to medication or plan of care at this time.  Will plan to follow-up in 3 months with repeat labs.        FOLLOW-UP Return in about 6 months (around 09/11/2022) for DM. HTN.   Kimberly Keeler, DNP, AGNP-c 03/11/2022 10:46 AM

## 2022-03-12 ENCOUNTER — Encounter (HOSPITAL_BASED_OUTPATIENT_CLINIC_OR_DEPARTMENT_OTHER): Payer: Self-pay | Admitting: Nurse Practitioner

## 2022-03-12 NOTE — Assessment & Plan Note (Signed)
Chronic.  No alarm symptoms or concerns present today.  Patient is not checking blood sugars however her most recent A1c was improved.  She did not start Mounjaro after recent prescription initially because medication was not approved by insurance however once it was approved she reports she had concerns with the possibility of carcinoma which is understandable.  She would like to discuss other options for her blood sugar with her oncologist and plans to do that at her next visit.  At this time we will plan to continue with the metformin as she is tolerating this well and her blood sugars more recently improved.  We will plan to follow-up in approximately 3 months with labs or sooner if needed.

## 2022-03-12 NOTE — Assessment & Plan Note (Signed)
Chronic.  Currently on statin therapy.  Previous labs reviewed today.  No changes to medication or plan of care at this time.  Will plan to follow-up in 3 months with repeat labs.

## 2022-03-12 NOTE — Assessment & Plan Note (Signed)
Chronic.  Blood pressure initially elevated today however repeat check is within normal limits.  She is not having the alarm symptoms present at this time.  Will plan to obtain labs for evaluation for urine microalbumin.  Previous labs reviewed today.  Will plan to follow-up in approximately 3 months or sooner if needed.

## 2022-03-13 ENCOUNTER — Other Ambulatory Visit: Payer: Self-pay | Admitting: Internal Medicine

## 2022-03-13 NOTE — Addendum Note (Signed)
Addended by: Thomes Cake on: 03/13/2022 03:23 PM   Modules accepted: Orders

## 2022-03-14 MED ORDER — METFORMIN HCL 500 MG PO TABS: 500.0000 mg | ORAL_TABLET | Freq: Every day | ORAL | 0 refills | Status: DC

## 2022-03-17 ENCOUNTER — Ambulatory Visit (INDEPENDENT_AMBULATORY_CARE_PROVIDER_SITE_OTHER): Payer: Medicare Other | Admitting: Podiatry

## 2022-03-17 DIAGNOSIS — B351 Tinea unguium: Secondary | ICD-10-CM

## 2022-03-17 DIAGNOSIS — L603 Nail dystrophy: Secondary | ICD-10-CM | POA: Diagnosis not present

## 2022-03-17 NOTE — Progress Notes (Signed)
  Subjective:  Patient ID: Kimberly Welch, female    DOB: 02/07/1951,  MRN: [id number]  Chief Complaint  Patient presents with   Nail Problem    Left great toenail split about 2 weeks ago. Patient has been soaking in epsom salt and using triple antibiotic ointment on the nail. Patient denies nausea, vomiting, fever, chills and pain    71 y.o. female presents with the above complaint. History confirmed with patient.   Objective:  Physical Exam: warm, good capillary refill, no trophic changes or ulcerative lesions, normal DP and PT pulses, normal sensory exam, and central splitting of the left great toenail that goes to midportion of nail plate, overall nail plate is well adhered no signs of infection there is some slight white discoloration.  Assessment:   1. Onychoschizia      Plan:  Patient was evaluated and treated and all questions answered.  Debrided the loose portions of the nail plate today.  I discussed with her that at this point I do not recommend avulsion of the nail plate at this time.  This may need to occur if it continues to worsen or becomes quite loose.  There was some white discoloration she may be developing early superficial white onychomycosis.  I recommended topical treatment with ciclopirox and use administration of this was reviewed and Rx sent to pharmacy.  She will return to see me as needed if it does not improve or worsens  Return if symptoms worsen or fail to improve.

## 2022-04-09 ENCOUNTER — Other Ambulatory Visit: Payer: Self-pay | Admitting: Nurse Practitioner

## 2022-04-29 DIAGNOSIS — H01111 Allergic dermatitis of right upper eyelid: Secondary | ICD-10-CM | POA: Diagnosis not present

## 2022-04-29 DIAGNOSIS — H02841 Edema of right upper eyelid: Secondary | ICD-10-CM | POA: Diagnosis not present

## 2022-05-26 DIAGNOSIS — E279 Disorder of adrenal gland, unspecified: Secondary | ICD-10-CM | POA: Diagnosis not present

## 2022-05-26 DIAGNOSIS — D378 Neoplasm of uncertain behavior of other specified digestive organs: Secondary | ICD-10-CM | POA: Diagnosis not present

## 2022-05-26 DIAGNOSIS — R978 Other abnormal tumor markers: Secondary | ICD-10-CM | POA: Diagnosis not present

## 2022-05-26 DIAGNOSIS — C181 Malignant neoplasm of appendix: Secondary | ICD-10-CM | POA: Diagnosis not present

## 2022-05-26 DIAGNOSIS — Z882 Allergy status to sulfonamides status: Secondary | ICD-10-CM | POA: Diagnosis not present

## 2022-06-10 ENCOUNTER — Other Ambulatory Visit: Payer: Self-pay | Admitting: Nurse Practitioner

## 2022-06-12 DIAGNOSIS — H40013 Open angle with borderline findings, low risk, bilateral: Secondary | ICD-10-CM | POA: Diagnosis not present

## 2022-06-12 DIAGNOSIS — E119 Type 2 diabetes mellitus without complications: Secondary | ICD-10-CM | POA: Diagnosis not present

## 2022-06-12 DIAGNOSIS — H25043 Posterior subcapsular polar age-related cataract, bilateral: Secondary | ICD-10-CM | POA: Diagnosis not present

## 2022-06-12 DIAGNOSIS — H25013 Cortical age-related cataract, bilateral: Secondary | ICD-10-CM | POA: Diagnosis not present

## 2022-06-12 LAB — HM DIABETES EYE EXAM

## 2022-07-04 ENCOUNTER — Other Ambulatory Visit: Payer: Self-pay | Admitting: Nurse Practitioner

## 2022-07-19 ENCOUNTER — Other Ambulatory Visit: Payer: Self-pay | Admitting: Nurse Practitioner

## 2022-08-01 DIAGNOSIS — H40013 Open angle with borderline findings, low risk, bilateral: Secondary | ICD-10-CM | POA: Diagnosis not present

## 2022-08-12 DIAGNOSIS — H40013 Open angle with borderline findings, low risk, bilateral: Secondary | ICD-10-CM | POA: Diagnosis not present

## 2022-08-25 ENCOUNTER — Other Ambulatory Visit: Payer: Self-pay | Admitting: Nurse Practitioner

## 2022-09-11 ENCOUNTER — Ambulatory Visit (INDEPENDENT_AMBULATORY_CARE_PROVIDER_SITE_OTHER): Payer: Medicare Other | Admitting: Family Medicine

## 2022-09-11 ENCOUNTER — Encounter (HOSPITAL_BASED_OUTPATIENT_CLINIC_OR_DEPARTMENT_OTHER): Payer: Self-pay | Admitting: Family Medicine

## 2022-09-11 ENCOUNTER — Ambulatory Visit (HOSPITAL_BASED_OUTPATIENT_CLINIC_OR_DEPARTMENT_OTHER): Payer: Medicare Other | Admitting: Nurse Practitioner

## 2022-09-11 VITALS — BP 142/89 | HR 95 | Ht 59.0 in | Wt 154.2 lb

## 2022-09-11 DIAGNOSIS — Z1283 Encounter for screening for malignant neoplasm of skin: Secondary | ICD-10-CM | POA: Diagnosis not present

## 2022-09-11 DIAGNOSIS — E119 Type 2 diabetes mellitus without complications: Secondary | ICD-10-CM

## 2022-09-11 DIAGNOSIS — I10 Essential (primary) hypertension: Secondary | ICD-10-CM | POA: Diagnosis not present

## 2022-09-11 DIAGNOSIS — Z23 Encounter for immunization: Secondary | ICD-10-CM | POA: Diagnosis not present

## 2022-09-11 MED ORDER — LOSARTAN POTASSIUM 50 MG PO TABS: 50.0000 mg | ORAL_TABLET | Freq: Every day | ORAL | 0 refills | Status: DC

## 2022-09-11 MED ORDER — HYDROCHLOROTHIAZIDE 12.5 MG PO CAPS: 12.5000 mg | ORAL_CAPSULE | Freq: Every day | ORAL | 0 refills | Status: DC

## 2022-09-11 MED ORDER — METFORMIN HCL 500 MG PO TABS: ORAL_TABLET | ORAL | 0 refills | Status: DC

## 2022-09-11 NOTE — Progress Notes (Signed)
Established Patient Office Visit  Subjective   Patient ID: Kimberly Welch, female    DOB: Dec 24, 1950  Age: 72 y.o. MRN: [id number]  Chief Complaint  Patient presents with   Follow-up    Pt here for f/u on DM and HTN     HPI  Hypertension Medication compliance: losartan 25 mg as prescribed.  Denies chest pain, shortness of breath, lower extremity edema, vision changes, headaches. Endorses occasional heart palpitations, resolve quickly.  Pertinent lab work: last CMP 05/27/23 GFR 72 Monitoring at home: does not monitor BP at home Tolerating medication well: no side effects  Continue current medication regimen: losartan 25 mg QD, will add HCTZ 12.5 mg QD.  Follow-up: 3 months   Diabetes: Medication compliance: metformin 500  mg at night Denies chest pain, shortness of breath, vision changes, polydipsia, polyphagia, polyuria. Denies hypoglycemia.  Pertinent lab work: A1C: last A1C on 12/09/21: 6.4 will get today. Last microalbumin urine 03/2022.  Monitoring: blood sugar readings at home: does not check blood sugar at home, needs another meter.           Continue current medication regimen: metformin 500 mg Q day  Well controlled: will make adjustments based on A1C results today  Follow-up: 3 months with PCP    Review of Systems  Eyes:  Negative for blurred vision and double vision.  Respiratory:  Negative for shortness of breath.   Cardiovascular:  Negative for chest pain.  Gastrointestinal:  Negative for abdominal pain, nausea and vomiting.  Neurological:  Negative for dizziness and headaches.      Objective:     BP (!) 142/89   Pulse 95   Ht '4\' 11"'$  (1.499 m)   Wt 154 lb 3.2 oz (69.9 kg)   LMP 08/05/2003   SpO2 99%   BMI 31.14 kg/m  BP Readings from Last 3 Encounters:  09/11/22 (!) 142/89  03/11/22 130/78  12/09/21 117/72      Physical Exam Vitals and nursing note reviewed.  Constitutional:      General: She is not in acute distress.    Appearance: Normal  appearance.  Cardiovascular:     Rate and Rhythm: Normal rate and regular rhythm.     Heart sounds: Normal heart sounds.  Pulmonary:     Effort: Pulmonary effort is normal.     Breath sounds: Normal breath sounds.  Skin:    General: Skin is warm and dry.     Capillary Refill: Capillary refill takes less than 2 seconds.  Neurological:     General: No focal deficit present.     Mental Status: She is alert. Mental status is at baseline.  Psychiatric:        Mood and Affect: Mood normal.        Behavior: Behavior normal.        Thought Content: Thought content normal.        Judgment: Judgment normal.     No results found for any visits on 09/11/22.    The 10-year ASCVD risk score (Arnett DK, et al., 2019) is: 28.8%    Assessment & Plan:   Problem List Items Addressed This Visit     Essential hypertension (Chronic)    Taking losartan 25 mg as prescribed.  Denies chest pain, shortness of breath, lower extremity edema, vision changes, headaches.  Endorses occasional heart palpitations which resolved quickly.  Her last CMP done 05/27/2023 GFR 72.  She reports that she does not check her blood pressure at home, does  not report any side effects to medications.  Blood pressure is not well-controlled.  Will add hydrochlorothiazide 12.5 mg daily and she will continue with losartan 25 mg daily.  She will follow-up in 3 months.  Encourage patient to start taking her blood pressure at home and to notify office if she started to get low readings.  Recommend Dash diet and moderate exercise.  Patient reports that she has not been walking lately.      Relevant Medications   losartan (COZAAR) 50 MG tablet   hydrochlorothiazide (MICROZIDE) 12.5 MG capsule   Type 2 diabetes mellitus (HCC)    Taking metformin 500 mg at night.  Denies chest pain, shortness of breath, vision changes, polydipsia, polyphagia, polyuria.  Denies any symptoms of hypoglycemia.  Her last A1c done on 12/09/2021 is 6.4, we will  get A1c today.  We will also get microalbumin urine today.  She reports that she does not check her blood sugars at home, she needs another meter I encouraged her to do so.  Continue with metformin 500 mg daily will make adjustments based on today's A1c results.  She will follow-up with her PCP in 3 months.      Relevant Medications   losartan (COZAAR) 50 MG tablet   metFORMIN (GLUCOPHAGE) 500 MG tablet   Other Relevant Orders   Hemoglobin A1c   Comprehensive metabolic panel   Microalbumin / creatinine urine ratio   Screening for skin cancer - Primary   Relevant Orders   Ambulatory referral to Dermatology   Other Visit Diagnoses     Need for immunization against influenza       Relevant Orders   Flu Vaccine QUAD High Dose(Fluad) (Completed)     Agrees with plan of care discussed.  Questions answered. Sinus rinses for nasal congestion.  Referral to dermatology for skin cancer check.  She will get influenza today.   Return in about 3 months (around 12/10/2022) for htn, dm .    Chalmers Guest, FNP

## 2022-09-11 NOTE — Assessment & Plan Note (Signed)
Taking metformin 500 mg at night.  Denies chest pain, shortness of breath, vision changes, polydipsia, polyphagia, polyuria.  Denies any symptoms of hypoglycemia.  Her last A1c done on 12/09/2021 is 6.4, we will get A1c today.  We will also get microalbumin urine today.  She reports that she does not check her blood sugars at home, she needs another meter I encouraged her to do so.  Continue with metformin 500 mg daily will make adjustments based on today's A1c results.  She will follow-up with her PCP in 3 months.

## 2022-09-11 NOTE — Assessment & Plan Note (Signed)
Taking losartan 25 mg as prescribed.  Denies chest pain, shortness of breath, lower extremity edema, vision changes, headaches.  Endorses occasional heart palpitations which resolved quickly.  Her last CMP done 05/27/2023 GFR 72.  She reports that she does not check her blood pressure at home, does not report any side effects to medications.  Blood pressure is not well-controlled.  Will add hydrochlorothiazide 12.5 mg daily and she will continue with losartan 25 mg daily.  She will follow-up in 3 months.  Encourage patient to start taking her blood pressure at home and to notify office if she started to get low readings.  Recommend Dash diet and moderate exercise.  Patient reports that she has not been walking lately.

## 2022-09-13 LAB — COMPREHENSIVE METABOLIC PANEL
ALT: 11 IU/L (ref 0–32)
AST: 18 IU/L (ref 0–40)
Albumin/Globulin Ratio: 1.7 (ref 1.2–2.2)
Albumin: 4.6 g/dL (ref 3.8–4.8)
Alkaline Phosphatase: 107 IU/L (ref 44–121)
BUN/Creatinine Ratio: 15 (ref 12–28)
BUN: 14 mg/dL (ref 8–27)
Bilirubin Total: 0.3 mg/dL (ref 0.0–1.2)
CO2: 23 mmol/L (ref 20–29)
Calcium: 10 mg/dL (ref 8.7–10.3)
Chloride: 102 mmol/L (ref 96–106)
Creatinine, Ser: 0.94 mg/dL (ref 0.57–1.00)
Globulin, Total: 2.7 g/dL (ref 1.5–4.5)
Glucose: 101 mg/dL — ABNORMAL HIGH (ref 70–99)
Potassium: 4.4 mmol/L (ref 3.5–5.2)
Sodium: 141 mmol/L (ref 134–144)
Total Protein: 7.3 g/dL (ref 6.0–8.5)
eGFR: 64 mL/min/{1.73_m2} (ref >59)

## 2022-09-13 LAB — MICROALBUMIN / CREATININE URINE RATIO
Creatinine, Urine: 123.9 mg/dL
Microalb/Creat Ratio: 8 mg/g creat (ref 0–29)
Microalbumin, Urine: 10.5 ug/mL

## 2022-09-13 LAB — HEMOGLOBIN A1C
Est. average glucose Bld gHb Est-mCnc: 134 mg/dL
Hgb A1c MFr Bld: 6.3 % — ABNORMAL HIGH (ref 4.8–5.6)

## 2022-09-18 DIAGNOSIS — H16223 Keratoconjunctivitis sicca, not specified as Sjogren's, bilateral: Secondary | ICD-10-CM | POA: Diagnosis not present

## 2022-09-26 ENCOUNTER — Encounter (HOSPITAL_BASED_OUTPATIENT_CLINIC_OR_DEPARTMENT_OTHER): Payer: Self-pay

## 2022-10-10 ENCOUNTER — Ambulatory Visit (INDEPENDENT_AMBULATORY_CARE_PROVIDER_SITE_OTHER): Payer: Medicare Other | Admitting: Nurse Practitioner

## 2022-10-10 ENCOUNTER — Encounter: Payer: Self-pay | Admitting: Nurse Practitioner

## 2022-10-10 VITALS — BP 122/80 | HR 80 | Wt 155.6 lb

## 2022-10-10 DIAGNOSIS — R109 Unspecified abdominal pain: Secondary | ICD-10-CM | POA: Diagnosis not present

## 2022-10-10 MED ORDER — TAMSULOSIN HCL 0.4 MG PO CAPS: 0.4000 mg | ORAL_CAPSULE | Freq: Every day | ORAL | 3 refills | Status: DC

## 2022-10-10 MED ORDER — TRAMADOL HCL 50 MG PO TABS: 50.0000 mg | ORAL_TABLET | Freq: Three times a day (TID) | ORAL | 0 refills | Status: AC | PRN

## 2022-10-10 NOTE — Progress Notes (Unsigned)
Orma Render, DNP, AGNP-c [place], [place] [address] [phone]  Subjective:   Kimberly Welch is a 72 y.o. female presents to day for evaluation of:  [name] presents today with a chief complaint of sharp pain in the side and back, which began last night. The patient expresses concern about the possibility of an infection and notes the absence of a bowel movement today. Additionally, she speculates whether gas could be the source of the pain. The patient also describes difficulties with sleep, including waking up two or three times a night and an inability to sleep on her left side.  The patient shares the personal responsibility of caring for her disabled brother, who is currently dealing with depression, highlighting this as a source of stress.   PMH, Medications, and Allergies reviewed and updated in chart as appropriate.   ROS negative except for what is listed in HPI. Objective:  BP 122/80   Pulse 80   Wt 155 lb 9.6 oz (70.6 kg)   LMP 08/05/2003   BMI 31.43 kg/m  Physical Exam Vitals and nursing note reviewed.  Constitutional:      General: She is not in acute distress.    Appearance: Normal appearance.  HENT:     Head: Normocephalic.  Eyes:     Extraocular Movements: Extraocular movements intact.     Conjunctiva/sclera: Conjunctivae normal.     Pupils: Pupils are equal, round, and reactive to light.  Neck:     Vascular: No carotid bruit.  Cardiovascular:     Rate and Rhythm: Normal rate and regular rhythm.     Pulses: Normal pulses.     Heart sounds: Normal heart sounds. No murmur heard. Pulmonary:     Effort: Pulmonary effort is normal.     Breath sounds: Normal breath sounds. No wheezing.  Abdominal:     General: Bowel sounds are normal. There is no distension.     Palpations: Abdomen is soft.     Tenderness: There is abdominal tenderness. There is no guarding.     Comments: Left flank pain with radiation into left  lower quadrant in wave like manner.   Musculoskeletal:        General: Normal range of motion.     Cervical back: Normal range of motion and neck supple.     Right lower leg: No edema.     Left lower leg: No edema.  Lymphadenopathy:     Cervical: No cervical adenopathy.  Skin:    General: Skin is warm and dry.     Capillary Refill: Capillary refill takes less than 2 seconds.  Neurological:     General: No focal deficit present.     Mental Status: She is alert and oriented to person, place, and time.  Psychiatric:        Mood and Affect: Mood normal.           Assessment & Plan:   Problem List Items Addressed This Visit     Abdominal wall pain in left flank - Primary    Symptoms consistent with kidney stones on the left side with no current signs of infection on UA. No alarm symptoms are present and the pain characteristics align with movement of renal calculus.  Plan: - Prescribe flomax for ease of passing stone.  - Pain management with Tramadol provided.  - If no improvement by Monday, recommend follow-up      Relevant Medications   tamsulosin (FLOMAX) 0.4 MG CAPS capsule  traMADol (ULTRAM) 50 MG tablet      Orma Render, DNP, AGNP-c 10/13/2022  7:17 PM    History, Medications, Surgery, SDOH, and Family History reviewed and updated as appropriate.

## 2022-10-10 NOTE — Patient Instructions (Signed)
If you are not feeling better by Monday, please let me know.   I am SO happy to see you.

## 2022-10-13 ENCOUNTER — Telehealth (HOSPITAL_BASED_OUTPATIENT_CLINIC_OR_DEPARTMENT_OTHER): Payer: Self-pay | Admitting: Family Medicine

## 2022-10-13 DIAGNOSIS — R109 Unspecified abdominal pain: Secondary | ICD-10-CM | POA: Insufficient documentation

## 2022-10-13 HISTORY — DX: Unspecified abdominal pain: R10.9

## 2022-10-13 NOTE — Assessment & Plan Note (Signed)
Symptoms consistent with kidney stones on the left side with no current signs of infection on UA. No alarm symptoms are present and the pain characteristics align with movement of renal calculus.  Plan: - Prescribe flomax for ease of passing stone.  - Pain management with Tramadol provided.  - If no improvement by Monday, recommend follow-up

## 2022-10-13 NOTE — Telephone Encounter (Signed)
Called patient to schedule Medicare Annual Wellness Visit (AWV). No voicemail available to leave a message.  Last date of AWV: 10/03/2020   Please schedule an AWVS appointment at any time with [facility].  If any questions, please contact me at [phone].    Thank you,  [place] Direct dial  [phone]

## 2022-11-17 ENCOUNTER — Encounter: Payer: Self-pay | Admitting: Dermatology

## 2022-11-17 ENCOUNTER — Ambulatory Visit: Payer: Medicare Other | Admitting: Dermatology

## 2022-11-17 VITALS — BP 106/66

## 2022-11-17 DIAGNOSIS — L820 Inflamed seborrheic keratosis: Secondary | ICD-10-CM

## 2022-11-17 DIAGNOSIS — L918 Other hypertrophic disorders of the skin: Secondary | ICD-10-CM

## 2022-11-17 DIAGNOSIS — B078 Other viral warts: Secondary | ICD-10-CM | POA: Diagnosis not present

## 2022-11-17 NOTE — Progress Notes (Signed)
   New Patient Visit   Subjective  Kimberly Welch is a 72 y.o. female who presents for the following:  Patient has growths on her right cheek and bilateral neck x 3 years. She states they have become bigger in the last year and get itchy.   The following portions of the chart were reviewed this encounter and updated as appropriate: medications, allergies, medical history  Review of Systems:  No other skin or systemic complaints except as noted in HPI or Assessment and Plan.  Objective  Well appearing patient in no apparent distress; mood and affect are within normal limits.   A focused examination was performed of the following areas: Face and neck.  Relevant exam findings are noted in the Assessment and Plan.    Assessment & Plan     WART Filiform warts  Exam: verrucous papule(s)  Discussed viral / HPV (Human Papilloma Virus) etiology and risk of spread /infectivity to other areas of body as well as to other people.  Multiple treatments and methods may be required to clear warts and it is possible treatment may not be successful.  Treatment risks include discoloration; scarring and there is still potential for wart recurrence.  Treatment Plan:   Destruction Procedure Note Destruction method: cryotherapy   Informed consent: discussed and consent obtained   Lesion destroyed using liquid nitrogen: Yes   Outcome: patient tolerated procedure well with no complications   Post-procedure details: wound care instructions given   Locations: right cheek, right neck # of Lesions Treated: 6  INFLAMED SEBORRHEIC KERATOSIS Exam: Erythematous keratotic or waxy stuck-on papule or plaque.  Symptomatic, irritating, patient would like treated.  Benign-appearing.  Call clinic for new or changing lesions.   Prior to procedure, discussed risks of blister formation, small wound, skin dyspigmentation, or rare scar following treatment. Recommend Vaseline ointment to treated areas while  healing.  Destruction Procedure Note Destruction method: cryotherapy   Informed consent: discussed and consent obtained   Lesion destroyed using liquid nitrogen: Yes   Outcome: patient tolerated procedure well with no complications   Post-procedure details: wound care instructions given   Locations: left cheek, right cheek, left forehead, right  forehead, left neck, right neck # of Lesions Treated: 16   Prior to procedure, discussed risks of blister formation, small wound, skin dyspigmentation, or rare scar following cryotherapy. Recommend Vaseline ointment to treated areas while healing.    Acrochordons (Skin Tags) - Fleshy, skin-colored pedunculated papules - Benign appearing.  - Observe. - If desired, they can be removed with an in office procedure that is not covered by insurance. - Please call the clinic if you notice any new or changing lesions.    No follow-ups on file.  Jaclynn Guarneri, CMA, am acting as scribe for Langston Reusing, MD.   Documentation: I have reviewed the above documentation for accuracy and completeness, and I agree with the above.  Langston Reusing, MD

## 2022-11-17 NOTE — Patient Instructions (Signed)
    Due to recent changes in healthcare laws, you may see results of your pathology and/or laboratory studies on MyChart before the doctors have had a chance to review them. We understand that in some cases there may be results that are confusing or concerning to you. Please understand that not all results are received at the same time and often the doctors may need to interpret multiple results in order to provide you with the best plan of care or course of treatment. Therefore, we ask that you please give us 2 business days to thoroughly review all your results before contacting the office for clarification. Should we see a critical lab result, you will be contacted sooner.   If You Need Anything After Your Visit  If you have any questions or concerns for your doctor, please call our main line at [phone] If no one answers, please leave a voicemail as directed and we will return your call as soon as possible. Messages left after 4 pm will be answered the following business day.   You may also send us a message via MyChart. We typically respond to MyChart messages within 1-2 business days.  For prescription refills, please ask your pharmacy to contact our office. Our fax number is [phone].  If you have an urgent issue when the clinic is closed that cannot wait until the next business day, you can page your doctor at the number below.    Please note that while we do our best to be available for urgent issues outside of office hours, we are not available 24/7.   If you have an urgent issue and are unable to reach us, you may choose to seek medical care at your doctor's office, retail clinic, urgent care center, or emergency room.  If you have a medical emergency, please immediately call 911 or go to the emergency department. In the event of inclement weather, please call our main line at [phone] for an update on the status of any delays or closures.  Dermatology Medication  Tips: Please keep the boxes that topical medications come in in order to help keep track of the instructions about where and how to use these. Pharmacies typically print the medication instructions only on the boxes and not directly on the medication tubes.   If your medication is too expensive, please contact our office at [phone] or send us a message through MyChart.   We are unable to tell what your co-pay for medications will be in advance as this is different depending on your insurance coverage. However, we may be able to find a substitute medication at lower cost or fill out paperwork to get insurance to cover a needed medication.   If a prior authorization is required to get your medication covered by your insurance company, please allow us 1-2 business days to complete this process.  Drug prices often vary depending on where the prescription is filled and some pharmacies may offer cheaper prices.  The website www.goodrx.com contains coupons for medications through different pharmacies. The prices here do not account for what the cost may be with help from insurance (it may be cheaper with your insurance), but the website can give you the price if you did not use any insurance.  - You can print the associated coupon and take it with your prescription to the pharmacy.  - You may also stop by our office during regular business hours and pick up a GoodRx coupon card.  -   If you need your prescription sent electronically to a different pharmacy, notify our office through [facility] MyChart or by phone at [phone]    Cryotherapy Aftercare  Wash gently with soap and water everyday.   Apply Vaseline and Band-Aid daily until healed.  

## 2022-11-18 DIAGNOSIS — H401212 Low-tension glaucoma, right eye, moderate stage: Secondary | ICD-10-CM | POA: Diagnosis not present

## 2022-11-24 DIAGNOSIS — I10 Essential (primary) hypertension: Secondary | ICD-10-CM | POA: Diagnosis not present

## 2022-11-24 DIAGNOSIS — E278 Other specified disorders of adrenal gland: Secondary | ICD-10-CM | POA: Diagnosis not present

## 2022-11-24 DIAGNOSIS — R978 Other abnormal tumor markers: Secondary | ICD-10-CM | POA: Diagnosis not present

## 2022-11-24 DIAGNOSIS — E785 Hyperlipidemia, unspecified: Secondary | ICD-10-CM | POA: Diagnosis not present

## 2022-11-24 DIAGNOSIS — C181 Malignant neoplasm of appendix: Secondary | ICD-10-CM | POA: Diagnosis not present

## 2022-11-24 DIAGNOSIS — E119 Type 2 diabetes mellitus without complications: Secondary | ICD-10-CM | POA: Diagnosis not present

## 2022-11-24 DIAGNOSIS — R932 Abnormal findings on diagnostic imaging of liver and biliary tract: Secondary | ICD-10-CM | POA: Diagnosis not present

## 2022-11-24 DIAGNOSIS — R9389 Abnormal findings on diagnostic imaging of other specified body structures: Secondary | ICD-10-CM | POA: Diagnosis not present

## 2022-11-24 DIAGNOSIS — Z7984 Long term (current) use of oral hypoglycemic drugs: Secondary | ICD-10-CM | POA: Diagnosis not present

## 2022-12-04 ENCOUNTER — Telehealth: Payer: Self-pay | Admitting: Nurse Practitioner

## 2022-12-04 NOTE — Telephone Encounter (Signed)
Contacted Kimberly Welch to schedule their annual wellness visit. Appointment made for 12/09/22.  Rudell Cobb AWV direct phone # [phone]

## 2022-12-04 NOTE — Telephone Encounter (Signed)
Called patient to schedule Medicare Annual Wellness Visit (AWV). Left message for patient to call back and schedule Medicare Annual Wellness Visit (AWV).  Last date of AWV: 10/03/20  Please schedule an appointment at any time with [facility].  If any questions, please contact me at [phone].  Thank you ,  Rudell Cobb AWV direct phone # [phone]

## 2022-12-09 ENCOUNTER — Ambulatory Visit (INDEPENDENT_AMBULATORY_CARE_PROVIDER_SITE_OTHER): Payer: Medicare Other

## 2022-12-09 VITALS — BP 128/62 | HR 90 | Temp 97.9°F | Ht 59.0 in | Wt 154.4 lb

## 2022-12-09 DIAGNOSIS — Z0000 Encounter for general adult medical examination without abnormal findings: Secondary | ICD-10-CM

## 2022-12-09 NOTE — Patient Instructions (Signed)
Kimberly Welch , Thank you for taking time to come for your Medicare Wellness Visit. I appreciate your ongoing commitment to your health goals. Please review the following plan we discussed and let me know if I can assist you in the future.   These are the goals we discussed:  Goals      lose weight     Continue to exercise, eat a healthier diet, decrease the amount of soda.     Patient Stated     12/09/2022[facility]        This is a list of the screening recommended for you and due dates:  Health Maintenance  Topic Date Due   Zoster (Shingles) Vaccine (1 of 2) Never done   DEXA scan (bone density measurement)  Never done   Colon Cancer Screening  02/27/2018   Mammogram  01/27/2020   Complete foot exam   10/03/2021   COVID-19 Vaccine (5 - 2023-24 season) 04/04/2022   Flu Shot  03/05/2023   Hemoglobin A1C  03/12/2023   Eye exam for diabetics  06/13/2023   Yearly kidney function blood test for diabetes  09/12/2023   Yearly kidney health urinalysis for diabetes  09/12/2023   Medicare Annual Wellness Visit  12/09/2023   DTaP/Tdap/Td vaccine (2 - Td or Tdap) 02/10/2028   Pneumonia Vaccine  Completed   Hepatitis C Screening: USPSTF Recommendation to screen - Ages 46-79 yo.  Completed   HPV Vaccine  Aged Out    Advanced directives: Advance directive discussed with you today. Even though you declined this today please call our office should you change your mind and we can give you the proper paperwork for you to fill out.  Conditions/risks identified: none  Next appointment: Follow up in one year for your annual wellness visit    Preventive Care 65 Years and Older, Female Preventive care refers to lifestyle choices and visits with your health care provider that can promote health and wellness. What does preventive care include? A yearly physical exam. This is also called an annual well check. Dental exams once or twice a year. Routine eye exams. Ask your  health care provider how often you should have your eyes checked. Personal lifestyle choices, including: Daily care of your teeth and gums. Regular physical activity. Eating a healthy diet. Avoiding tobacco and drug use. Limiting alcohol use. Practicing safe sex. Taking low-dose aspirin every day. Taking vitamin and mineral supplements as recommended by your health care provider. What happens during an annual well check? The services and screenings done by your health care provider during your annual well check will depend on your age, overall health, lifestyle risk factors, and family history of disease. Counseling  Your health care provider may ask you questions about your: Alcohol use. Tobacco use. Drug use. Emotional well-being. Home and relationship well-being. Sexual activity. Eating habits. History of falls. Memory and ability to understand (cognition). Work and work Astronomer. Reproductive health. Screening  You may have the following tests or measurements: Height, weight, and BMI. Blood pressure. Lipid and cholesterol levels. These may be checked every 5 years, or more frequently if you are over 52 years old. Skin check. Lung cancer screening. You may have this screening every year starting at age 36 if you have a 30-pack-year history of smoking and currently smoke or have quit within the past 15 years. Fecal occult blood test (FOBT) of the stool. You may have this test every year starting at age  50. Flexible sigmoidoscopy or colonoscopy. You may have a sigmoidoscopy every 5 years or a colonoscopy every 10 years starting at age 70. Hepatitis C blood test. Hepatitis B blood test. Sexually transmitted disease (STD) testing. Diabetes screening. This is done by checking your blood sugar (glucose) after you have not eaten for a while (fasting). You may have this done every 1-3 years. Bone density scan. This is done to screen for osteoporosis. You may have this done  starting at age 55. Mammogram. This may be done every 1-2 years. Talk to your health care provider about how often you should have regular mammograms. Talk with your health care provider about your test results, treatment options, and if necessary, the need for more tests. Vaccines  Your health care provider may recommend certain vaccines, such as: Influenza vaccine. This is recommended every year. Tetanus, diphtheria, and acellular pertussis (Tdap, Td) vaccine. You may need a Td booster every 10 years. Zoster vaccine. You may need this after age 68. Pneumococcal 13-valent conjugate (PCV13) vaccine. One dose is recommended after age 58. Pneumococcal polysaccharide (PPSV23) vaccine. One dose is recommended after age 78. Talk to your health care provider about which screenings and vaccines you need and how often you need them. This information is not intended to replace advice given to you by your health care provider. Make sure you discuss any questions you have with your health care provider. Document Released: 08/17/2015 Document Revised: 04/09/2016 Document Reviewed: 05/22/2015 Elsevier Interactive Patient Education  2017 [employer] Prevention in the Home Falls can cause injuries. They can happen to people of all ages. There are many things you can do to make your home safe and to help prevent falls. What can I do on the outside of my home? Regularly fix the edges of walkways and driveways and fix any cracks. Remove anything that might make you trip as you walk through a door, such as a raised step or threshold. Trim any bushes or trees on the path to your home. Use bright outdoor lighting. Clear any walking paths of anything that might make someone trip, such as rocks or tools. Regularly check to see if handrails are loose or broken. Make sure that both sides of any steps have handrails. Any raised decks and porches should have guardrails on the edges. Have any leaves, snow, or  ice cleared regularly. Use sand or salt on walking paths during winter. Clean up any spills in your garage right away. This includes oil or grease spills. What can I do in the bathroom? Use night lights. Install grab bars by the toilet and in the tub and shower. Do not use towel bars as grab bars. Use non-skid mats or decals in the tub or shower. If you need to sit down in the shower, use a plastic, non-slip stool. Keep the floor dry. Clean up any water that spills on the floor as soon as it happens. Remove soap buildup in the tub or shower regularly. Attach bath mats securely with double-sided non-slip rug tape. Do not have throw rugs and other things on the floor that can make you trip. What can I do in the bedroom? Use night lights. Make sure that you have a light by your bed that is easy to reach. Do not use any sheets or blankets that are too big for your bed. They should not hang down onto the floor. Have a firm chair that has side arms. You can use this for support while you  get dressed. Do not have throw rugs and other things on the floor that can make you trip. What can I do in the kitchen? Clean up any spills right away. Avoid walking on wet floors. Keep items that you use a lot in easy-to-reach places. If you need to reach something above you, use a strong step stool that has a grab bar. Keep electrical cords out of the way. Do not use floor polish or wax that makes floors slippery. If you must use wax, use non-skid floor wax. Do not have throw rugs and other things on the floor that can make you trip. What can I do with my stairs? Do not leave any items on the stairs. Make sure that there are handrails on both sides of the stairs and use them. Fix handrails that are broken or loose. Make sure that handrails are as long as the stairways. Check any carpeting to make sure that it is firmly attached to the stairs. Fix any carpet that is loose or worn. Avoid having throw rugs at  the top or bottom of the stairs. If you do have throw rugs, attach them to the floor with carpet tape. Make sure that you have a light switch at the top of the stairs and the bottom of the stairs. If you do not have them, ask someone to add them for you. What else can I do to help prevent falls? Wear shoes that: Do not have high heels. Have rubber bottoms. Are comfortable and fit you well. Are closed at the toe. Do not wear sandals. If you use a stepladder: Make sure that it is fully opened. Do not climb a closed stepladder. Make sure that both sides of the stepladder are locked into place. Ask someone to hold it for you, if possible. Clearly mark and make sure that you can see: Any grab bars or handrails. First and last steps. Where the edge of each step is. Use tools that help you move around (mobility aids) if they are needed. These include: Canes. Walkers. Scooters. Crutches. Turn on the lights when you go into a dark area. Replace any light bulbs as soon as they burn out. Set up your furniture so you have a clear path. Avoid moving your furniture around. If any of your floors are uneven, fix them. If there are any pets around you, be aware of where they are. Review your medicines with your doctor. Some medicines can make you feel dizzy. This can increase your chance of falling. Ask your doctor what other things that you can do to help prevent falls. This information is not intended to replace advice given to you by your health care provider. Make sure you discuss any questions you have with your health care provider. Document Released: 05/17/2009 Document Revised: 12/27/2015 Document Reviewed: 08/25/2014 Elsevier Interactive Patient Education  2017 [employer].

## 2022-12-09 NOTE — Progress Notes (Signed)
Subjective:   Kimberly Welch is a 72 y.o. female who presents for Medicare Annual (Subsequent) preventive examination.  Review of Systems     Cardiac Risk Factors include: advanced age (>28men, >18 women);diabetes mellitus;dyslipidemia;hypertension;obesity (BMI >30kg/m2)     Objective:    Today's Vitals   12/09/22 1356  BP: 128/62  Pulse: 90  Temp: 97.9 F (36.6 C)  TempSrc: Oral  SpO2: 98%  Weight: 154 lb 6.4 oz (70 kg)  Height: 4\' 11"  (1.499 m)   Body mass index is 31.19 kg/m.     12/09/2022    2:09 PM 06/17/2021   12:14 PM 04/27/2020    8:26 PM 04/27/2018    6:19 AM 01/06/2017    3:13 PM 11/28/2015    8:38 AM 09/22/2015   12:11 AM  Advanced Directives  Does Patient Have a Medical Advance Directive? No No No No No No No  Would patient like information on creating a medical advance directive? No - Patient declined No - Patient declined No - Patient declined Yes (MAU/Ambulatory/Procedural Areas - Information given) Yes (ED - Information included in AVS) Yes - Educational materials given No - patient declined information    Current Medications (verified) Outpatient Encounter Medications as of 12/09/2022  Medication Sig   albuterol (VENTOLIN HFA) 108 (90 Base) MCG/ACT inhaler Inhale 2 puffs into the lungs every 6 (six) hours as needed.   Artificial Tear Ointment (DRY EYES OP) Place 1 drop into both eyes daily as needed (dry eyes).   aspirin EC 81 MG tablet Take 162 mg by mouth daily as needed (when eat fried foods). Swallow whole.   hydrochlorothiazide (MICROZIDE) 12.5 MG capsule Take 1 capsule (12.5 mg total) by mouth daily.   ibuprofen (ADVIL) 200 MG tablet Take 1 tablet by mouth as needed.   latanoprost (XALATAN) 0.005 % ophthalmic solution latanoprost 0.005 % eye drops   losartan (COZAAR) 50 MG tablet Take 1 tablet (50 mg total) by mouth daily.   metFORMIN (GLUCOPHAGE) 500 MG tablet TAKE 1 TAB BY MOUTH DAILY WITH BREAKFAST. ANNUAL APPT DUE IN SEPT MUST SEE PROVIDER FOR  REFILLS   ondansetron (ZOFRAN) 4 MG tablet Take 1 tablet by mouth as needed.   pravastatin (PRAVACHOL) 20 MG tablet Take 1 tablet (20 mg total) by mouth at bedtime.   tamsulosin (FLOMAX) 0.4 MG CAPS capsule Take 1 capsule (0.4 mg total) by mouth daily.   aspirin EC 325 MG tablet Take 325 mg by mouth daily as needed (when eating fried food.). Pt only takes this when she doesn't have the 81 mg tabs (Patient not taking: Reported on 10/10/2022)   cycloSPORINE (RESTASIS) 0.05 % ophthalmic emulsion Restasis 0.05 % eye drops in a dropperette (Patient not taking: Reported on 10/10/2022)   meclizine (ANTIVERT) 25 MG tablet Take 1 tablet (25 mg total) by mouth 3 (three) times daily as needed for dizziness. (Patient not taking: Reported on 10/10/2022)   triamcinolone cream (KENALOG) 0.1 % triamcinolone acetonide 0.1 % topical cream (Patient not taking: Reported on 10/10/2022)   No facility-administered encounter medications on file as of 12/09/2022.    Allergies (verified) Sulfa antibiotics   History: Past Medical History:  Diagnosis Date   Cholelithiasis    symptomatic   GERD (gastroesophageal reflux disease)    Hyperlipidemia    Hypertension    Type 2 diabetes mellitus (HCC)    Wears partial dentures    UPPER AND LOWER   Past Surgical History:  Procedure Laterality Date   CHOLECYSTECTOMY N/A 11/28/2015  Procedure: LAPAROSCOPIC CHOLECYSTECTOMY;  Surgeon: Rodman Pickle, MD;  Location: [facility];  Service: General;  Laterality: N/A;   D & C HYSTEROSCOPY/  RESECTION ENDOMETRIAL POLYP'S  01-11-2009   DILATATION & CURRETTAGE/HYSTEROSCOPY WITH RESECTOCOPE N/A 04/27/2018   Procedure: DILATION  AND CURETTAGE / HYSTEROSCOPY WITH RESECTION;  Surgeon: Hal Morales, MD;  Location: [facility];  Service: Gynecology;  Laterality: N/A;   LAPAROSCOPIC APPENDECTOMY N/A 04/21/2021   Procedure: APPENDECTOMY LAPAROSCOPIC;  Surgeon: Berna Bue, MD;  Location: [facility];   Service: General;  Laterality: N/A;   Family History  Problem Relation Age of Onset   Prostate cancer Brother    Colon cancer Neg Hx    Appendicitis Neg Hx    Social History   Socioeconomic History   Marital status: Significant Other    Spouse name: Not on file   Number of children: Not on file   Years of education: Not on file   Highest education level: Not on file  Occupational History   Not on file  Tobacco Use   Smoking status: Never   Smokeless tobacco: Never  Vaping Use   Vaping Use: Never used  Substance and Sexual Activity   Alcohol use: Not Currently    Comment: RARE   Drug use: No   Sexual activity: Not on file  Other Topics Concern   Not on file  Social History Narrative   Not on file   Social Determinants of Health   Financial Resource Strain: Low Risk  (12/09/2022)   Overall Financial Resource Strain (CARDIA)    Difficulty of Paying Living Expenses: Not hard at all  Food Insecurity: No Food Insecurity (12/09/2022)   Hunger Vital Sign    Worried About Running Out of Food in the Last Year: Never true    Ran Out of Food in the Last Year: Never true  Transportation Needs: No Transportation Needs (12/09/2022)   PRAPARE - Administrator, Civil Service (Medical): No    Lack of Transportation (Non-Medical): No  Physical Activity: Inactive (12/09/2022)   Exercise Vital Sign    Days of Exercise per Week: 0 days    Minutes of Exercise per Session: 0 min  Stress: Stress Concern Present (12/09/2022)   [employer] of Occupational Health - Occupational Stress Questionnaire    Feeling of Stress : To some extent  Social Connections: Not on file    Tobacco Counseling Counseling given: Not Answered   Clinical Intake:  Pre-visit preparation completed: Yes  Pain : No/denies pain     Nutritional Status: BMI > 30  Obese Nutritional Risks: None Diabetes: Yes  How often do you need to have someone help you when you read instructions, pamphlets, or  other written materials from your doctor or pharmacy?: 1 - Never  Diabetic? Yes Nutrition Risk Assessment:  Has the patient had any N/V/D within the last 2 months?  No  Does the patient have any non-healing wounds?  No  Has the patient had any unintentional weight loss or weight gain?  No   Diabetes:  Is the patient diabetic?  Yes  If diabetic, was a CBG obtained today?  No  Did the patient bring in their glucometer from home?  No  How often do you monitor your CBG's? Does not.   Financial Strains and Diabetes Management:  Are you having any financial strains with the device, your supplies or your medication? No .  Does the patient want to be  seen by Chronic Care Management for management of their diabetes?  No  Would the patient like to be referred to a Nutritionist or for Diabetic Management?  No   Diabetic Exams:  Diabetic Eye Exam: Completed 06/12/2022 Diabetic Foot Exam: Overdue, Pt has been advised about the importance in completing this exam. Pt is scheduled for diabetic foot exam on next appointment.   Interpreter Needed?: No  Information entered by :: NAllen LPN   Activities of Daily Living    12/09/2022    2:12 PM 09/11/2022    1:17 PM  In your present state of health, do you have any difficulty performing the following activities:  Hearing? 0 0  Vision? 0 0  Difficulty concentrating or making decisions? 0 0  Walking or climbing stairs? 0 0  Dressing or bathing? 0 0  Doing errands, shopping? 0 0  Preparing Food and eating ? N   Using the Toilet? N   In the past six months, have you accidently leaked urine? Y   Do you have problems with loss of bowel control? N   Managing your Medications? N   Managing your Finances? N   Housekeeping or managing your Housekeeping? N     Patient Care Team: Early, Sung Amabile, NP as PCP - General (Nurse Practitioner) Hal Morales, MD (Inactive) as Consulting Physician (Obstetrics and Gynecology) Iva Boop, MD as  Consulting Physician (Gastroenterology)  Indicate any recent Medical Services you may have received from other than [facility] providers in the past year (date may be approximate).     Assessment:   This is a routine wellness examination for Myah.  Hearing/Vision screen Vision Screening - Comments:: Regular eye exams, [facility]  Dietary issues and exercise activities discussed: Current Exercise Habits: The patient does not participate in regular exercise at present   Goals Addressed             This Visit's Progress    Patient Stated       12/09/2022[facility]       Depression Screen    12/09/2022    2:09 PM 09/11/2022    1:16 PM 12/09/2021    2:00 PM 09/16/2021    7:54 AM 04/20/2020    3:52 PM 03/28/2019   10:47 AM 02/09/2018   10:27 AM  PHQ 2/9 Scores  PHQ - 2 Score 0 0 0 0 1 1 0  PHQ- 9 Score 1 0       Exception Documentation  Medical reason Medical reason        Fall Risk    12/09/2022    2:09 PM 09/11/2022    1:16 PM 12/09/2021    2:00 PM 09/16/2021    7:54 AM 04/20/2020    3:52 PM  Fall Risk   Falls in the past year? 0 0 0 0 0  Number falls in past yr: 0 0 0 0 0  Injury with Fall? 0 0 0 0 0  Risk for fall due to : Medication side effect No Fall Risks No Fall Risks No Fall Risks   Follow up Falls prevention discussed;Education provided;Falls evaluation completed Falls evaluation completed Education provided;Falls evaluation completed Falls evaluation completed     FALL RISK PREVENTION PERTAINING TO THE HOME:  Any stairs in or around the home? Yes  If so, are there any without handrails? No  Home free of loose throw rugs in walkways, pet beds, electrical cords, etc? Yes  Adequate  lighting in your home to reduce risk of falls? Yes   ASSISTIVE DEVICES UTILIZED TO PREVENT FALLS:  Life alert? No  Use of a cane, walker or w/c? No  Grab bars in the bathroom? No  Shower chair or bench in shower? No  Elevated toilet seat or a handicapped toilet?  No   TIMED UP AND GO:  Was the test performed? Yes .  Length of time to ambulate 10 feet: 5 sec.   Gait steady and fast without use of assistive device  Cognitive Function:        12/09/2022    2:15 PM  6CIT Screen  What Year? 0 points  What month? 0 points  What time? 0 points  Count back from 20 0 points  Months in reverse 0 points  Repeat phrase 0 points  Total Score 0 points    Immunizations Immunization History  Administered Date(s) Administered   Fluad Quad(high Dose 65+) 03/28/2019, 04/12/2021, 09/11/2022   PFIZER(Purple Top)SARS-COV-2 Vaccination 09/26/2019, 10/21/2019, 06/22/2020   Pfizer Covid-19 Vaccine Bivalent Booster 26yrs & up 06/20/2021   Pneumococcal Conjugate-13 02/09/2018   Pneumococcal Polysaccharide-23 10/03/2020   Tdap 02/09/2018    TDAP status: Up to date  Flu Vaccine status: Up to date  Pneumococcal vaccine status: Up to date  Covid-19 vaccine status: Completed vaccines  Qualifies for Shingles Vaccine? Yes   Zostavax completed No   Shingrix Completed?: No.    Education has been provided regarding the importance of this vaccine. Patient has been advised to call insurance company to determine out of pocket expense if they have not yet received this vaccine. Advised may also receive vaccine at local pharmacy or Health Dept. Verbalized acceptance and understanding.  Screening Tests Health Maintenance  Topic Date Due   Zoster Vaccines- Shingrix (1 of 2) Never done   DEXA SCAN  Never done   COLONOSCOPY (Pts 45-53yrs Insurance coverage will need to be confirmed)  02/27/2018   MAMMOGRAM  01/27/2020   FOOT EXAM  10/03/2021   Medicare Annual Wellness (AWV)  10/03/2021   COVID-19 Vaccine (5 - 2023-24 season) 04/04/2022   INFLUENZA VACCINE  03/05/2023   HEMOGLOBIN A1C  03/12/2023   OPHTHALMOLOGY EXAM  06/13/2023   Diabetic kidney evaluation - eGFR measurement  09/12/2023   Diabetic kidney evaluation - Urine ACR  09/12/2023   DTaP/Tdap/Td (2 -  Td or Tdap) 02/10/2028   Pneumonia Vaccine 77+ Years old  Completed   Hepatitis C Screening  Completed   HPV VACCINES  Aged Out    Health Maintenance  Health Maintenance Due  Topic Date Due   Zoster Vaccines- Shingrix (1 of 2) Never done   DEXA SCAN  Never done   COLONOSCOPY (Pts 45-13yrs Insurance coverage will need to be confirmed)  02/27/2018   MAMMOGRAM  01/27/2020   FOOT EXAM  10/03/2021   Medicare Annual Wellness (AWV)  10/03/2021   COVID-19 Vaccine (5 - 2023-24 season) 04/04/2022    Colorectal cancer screening: Type of screening: Colonoscopy. Completed 2022. Repeat every 10 years  Mammogram status: Ordered today. Pt provided with contact info and advised to call to schedule appt.   Bone Density status: Ordered today. Pt provided with contact info and advised to call to schedule appt.  Lung Cancer Screening: (Low Dose CT Chest recommended if Age 82-80 years, 30 pack-year currently smoking OR have quit w/in 15years.) does not qualify.   Lung Cancer Screening Referral: no  Additional Screening:  Hepatitis C Screening: does qualify; Completed 10/08/2015  Vision Screening: Recommended annual ophthalmology exams for early detection of glaucoma and other disorders of the eye. Is the patient up to date with their annual eye exam?  Yes  Who is the provider or what is the name of the office in which the patient attends annual eye exams? [facility] If pt is not established with a provider, would they like to be referred to a provider to establish care? No .   Dental Screening: Recommended annual dental exams for proper oral hygiene  Community Resource Referral / Chronic Care Management: CRR required this visit?  No   CCM required this visit?  No      Plan:     I have personally reviewed and noted the following in the patient's chart:   Medical and social history Use of alcohol, tobacco or illicit drugs  Current medications and supplements including opioid  prescriptions. Patient is not currently taking opioid prescriptions. Functional ability and status Nutritional status Physical activity Advanced directives List of other physicians Hospitalizations, surgeries, and ER visits in previous 12 months Vitals Screenings to include cognitive, depression, and falls Referrals and appointments  In addition, I have reviewed and discussed with patient certain preventive protocols, quality metrics, and best practice recommendations. A written personalized care plan for preventive services as well as general preventive health recommendations were provided to patient.     Barb Merino, LPN   08/09/1094   Nurse Notes: none

## 2022-12-10 ENCOUNTER — Ambulatory Visit (HOSPITAL_BASED_OUTPATIENT_CLINIC_OR_DEPARTMENT_OTHER): Payer: Medicare Other | Admitting: Family Medicine

## 2022-12-12 ENCOUNTER — Other Ambulatory Visit (HOSPITAL_BASED_OUTPATIENT_CLINIC_OR_DEPARTMENT_OTHER): Payer: Self-pay | Admitting: Family Medicine

## 2022-12-12 DIAGNOSIS — I10 Essential (primary) hypertension: Secondary | ICD-10-CM

## 2023-01-01 ENCOUNTER — Other Ambulatory Visit (HOSPITAL_BASED_OUTPATIENT_CLINIC_OR_DEPARTMENT_OTHER): Payer: Self-pay | Admitting: Nurse Practitioner

## 2023-01-01 DIAGNOSIS — E1169 Type 2 diabetes mellitus with other specified complication: Secondary | ICD-10-CM

## 2023-01-01 DIAGNOSIS — I10 Essential (primary) hypertension: Secondary | ICD-10-CM

## 2023-01-04 ENCOUNTER — Other Ambulatory Visit: Payer: Self-pay | Admitting: Nurse Practitioner

## 2023-01-04 DIAGNOSIS — R109 Unspecified abdominal pain: Secondary | ICD-10-CM

## 2023-01-19 ENCOUNTER — Ambulatory Visit: Payer: Federal, State, Local not specified - PPO | Admitting: Dermatology

## 2023-01-26 ENCOUNTER — Encounter: Payer: Self-pay | Admitting: Dermatology

## 2023-01-26 ENCOUNTER — Ambulatory Visit (INDEPENDENT_AMBULATORY_CARE_PROVIDER_SITE_OTHER): Payer: Medicare Other | Admitting: Dermatology

## 2023-01-26 DIAGNOSIS — N898 Other specified noninflammatory disorders of vagina: Secondary | ICD-10-CM | POA: Insufficient documentation

## 2023-01-26 DIAGNOSIS — L820 Inflamed seborrheic keratosis: Secondary | ICD-10-CM | POA: Diagnosis not present

## 2023-01-26 HISTORY — DX: Other specified noninflammatory disorders of vagina: N89.8

## 2023-01-26 NOTE — Progress Notes (Signed)
   Follow-Up Visit   Subjective  Kimberly Welch is a 72 y.o. female who presents for the following: ISK  Patient present today for follow up visit for ISK. Patient was last evaluated on 11/17/2022. Patient reports the areas treated are better. Patient reports no medication changes.  The following portions of the chart were reviewed this encounter and updated as appropriate: medications, allergies, medical history  Review of Systems:  No other skin or systemic complaints except as noted in HPI or Assessment and Plan.  Objective  Well appearing patient in no apparent distress; mood and affect are within normal limits.  A focused examination was performed of the following areas: Neck and face  Relevant exam findings are noted in the Assessment and Plan.  Head - Anterior (Face), Neck - Anterior Inflamed SEBORRHEIC KERATOSES       Assessment & Plan   INFLAMED SEBORRHEIC KERATOSIS Exam: Erythematous keratotic or waxy stuck-on papule or plaque.  Symptomatic, irritating, patient would like treated.  Benign-appearing.  Advised to contact our office for new or changing lesions.   Prior to procedure, discussed risks of blister formation, small wound, skin dyspigmentation, or rare scar following treatment. Recommend Vaseline ointment to treated areas while healing.  Inflamed seborrheic keratosis (2) Head - Anterior (Face); Neck - Anterior  Symptomatic, irritating, patient would like treated.  Benign-appearing.  Call clinic for new or changing lesions.   Prior to procedure, discussed risks of blister formation, small wound, skin dyspigmentation, or rare scar following treatment. Recommend Vaseline ointment to treated areas while healing.   Destruction of lesion - Head - Anterior (Face), Neck - Anterior Complexity: simple   Destruction method: cryotherapy   Informed consent: discussed and consent obtained   Timeout:  patient name, date of birth, surgical site, and procedure  verified Lesion destroyed using liquid nitrogen: Yes   Outcome: patient tolerated procedure well with no complications   Post-procedure details: wound care instructions given     Return if symptoms worsen or fail to improve, for ISK Cryotherapy.  Documentation: I have reviewed the above documentation for accuracy and completeness, and I agree with the above.  Stasia Cavalier, am acting as scribe for Langston Reusing, DO.  Langston Reusing, DO

## 2023-01-26 NOTE — Patient Instructions (Signed)
Cryotherapy Aftercare  Wash gently with soap and water everyday.   Apply Vaseline and Band-Aid daily until healed.   Due to recent changes in healthcare laws, you may see results of your pathology and/or laboratory studies on MyChart before the doctors have had a chance to review them. We understand that in some cases there may be results that are confusing or concerning to you. Please understand that not all results are received at the same time and often the doctors may need to interpret multiple results in order to provide you with the best plan of care or course of treatment. Therefore, we ask that you please give us 2 business days to thoroughly review all your results before contacting the office for clarification. Should we see a critical lab result, you will be contacted sooner.   If You Need Anything After Your Visit  If you have any questions or concerns for your doctor, please call our main line at [phone] If no one answers, please leave a voicemail as directed and we will return your call as soon as possible. Messages left after 4 pm will be answered the following business day.   You may also send us a message via MyChart. We typically respond to MyChart messages within 1-2 business days.  For prescription refills, please ask your pharmacy to contact our office. Our fax number is [phone].  If you have an urgent issue when the clinic is closed that cannot wait until the next business day, you can page your doctor at the number below.    Please note that while we do our best to be available for urgent issues outside of office hours, we are not available 24/7.   If you have an urgent issue and are unable to reach us, you may choose to seek medical care at your doctor's office, retail clinic, urgent care center, or emergency room.  If you have a medical emergency, please immediately call 911 or go to the emergency department. In the event of inclement weather, please call our  main line at [phone] for an update on the status of any delays or closures.  Dermatology Medication Tips: Please keep the boxes that topical medications come in in order to help keep track of the instructions about where and how to use these. Pharmacies typically print the medication instructions only on the boxes and not directly on the medication tubes.   If your medication is too expensive, please contact our office at [phone] or send us a message through MyChart.   We are unable to tell what your co-pay for medications will be in advance as this is different depending on your insurance coverage. However, we may be able to find a substitute medication at lower cost or fill out paperwork to get insurance to cover a needed medication.   If a prior authorization is required to get your medication covered by your insurance company, please allow us 1-2 business days to complete this process.  Drug prices often vary depending on where the prescription is filled and some pharmacies may offer cheaper prices.  The website www.goodrx.com contains coupons for medications through different pharmacies. The prices here do not account for what the cost may be with help from insurance (it may be cheaper with your insurance), but the website can give you the price if you did not use any insurance.  - You can print the associated coupon and take it with your prescription to the pharmacy.  - You may also   stop by our office during regular business hours and pick up a GoodRx coupon card.  - If you need your prescription sent electronically to a different pharmacy, notify our office through [place] MyChart or by phone at [phone]     

## 2023-02-10 ENCOUNTER — Other Ambulatory Visit (HOSPITAL_BASED_OUTPATIENT_CLINIC_OR_DEPARTMENT_OTHER): Payer: Self-pay | Admitting: Family Medicine

## 2023-02-10 DIAGNOSIS — I10 Essential (primary) hypertension: Secondary | ICD-10-CM

## 2023-02-26 ENCOUNTER — Telehealth: Payer: Self-pay | Admitting: Family Medicine

## 2023-02-26 ENCOUNTER — Other Ambulatory Visit: Payer: Self-pay

## 2023-02-26 DIAGNOSIS — I10 Essential (primary) hypertension: Secondary | ICD-10-CM

## 2023-02-26 MED ORDER — LOSARTAN POTASSIUM 50 MG PO TABS
50.0000 mg | ORAL_TABLET | Freq: Every day | ORAL | 1 refills | Status: DC
Start: 2023-02-26 — End: 2023-05-20

## 2023-02-26 NOTE — Telephone Encounter (Signed)
Pt left message needs Losartan refill.

## 2023-04-10 ENCOUNTER — Other Ambulatory Visit (HOSPITAL_BASED_OUTPATIENT_CLINIC_OR_DEPARTMENT_OTHER): Payer: Self-pay

## 2023-05-18 DIAGNOSIS — D739 Disease of spleen, unspecified: Secondary | ICD-10-CM | POA: Diagnosis not present

## 2023-05-18 DIAGNOSIS — E119 Type 2 diabetes mellitus without complications: Secondary | ICD-10-CM | POA: Diagnosis not present

## 2023-05-18 DIAGNOSIS — Z08 Encounter for follow-up examination after completed treatment for malignant neoplasm: Secondary | ICD-10-CM | POA: Diagnosis not present

## 2023-05-18 DIAGNOSIS — K7689 Other specified diseases of liver: Secondary | ICD-10-CM | POA: Diagnosis not present

## 2023-05-18 DIAGNOSIS — R978 Other abnormal tumor markers: Secondary | ICD-10-CM | POA: Diagnosis not present

## 2023-05-18 DIAGNOSIS — Z9049 Acquired absence of other specified parts of digestive tract: Secondary | ICD-10-CM | POA: Diagnosis not present

## 2023-05-18 DIAGNOSIS — E785 Hyperlipidemia, unspecified: Secondary | ICD-10-CM | POA: Diagnosis not present

## 2023-05-18 DIAGNOSIS — I10 Essential (primary) hypertension: Secondary | ICD-10-CM | POA: Diagnosis not present

## 2023-05-18 DIAGNOSIS — C181 Malignant neoplasm of appendix: Secondary | ICD-10-CM | POA: Diagnosis not present

## 2023-05-18 DIAGNOSIS — Z7984 Long term (current) use of oral hypoglycemic drugs: Secondary | ICD-10-CM | POA: Diagnosis not present

## 2023-05-18 DIAGNOSIS — Z85038 Personal history of other malignant neoplasm of large intestine: Secondary | ICD-10-CM | POA: Diagnosis not present

## 2023-05-20 ENCOUNTER — Ambulatory Visit: Payer: Medicare Other | Admitting: Medical

## 2023-05-20 VITALS — BP 124/70 | HR 88 | Wt 153.2 lb

## 2023-05-20 DIAGNOSIS — D259 Leiomyoma of uterus, unspecified: Secondary | ICD-10-CM | POA: Diagnosis not present

## 2023-05-20 DIAGNOSIS — N393 Stress incontinence (female) (male): Secondary | ICD-10-CM | POA: Diagnosis not present

## 2023-05-20 DIAGNOSIS — Z8509 Personal history of malignant neoplasm of other digestive organs: Secondary | ICD-10-CM

## 2023-05-20 DIAGNOSIS — I10 Essential (primary) hypertension: Secondary | ICD-10-CM | POA: Diagnosis not present

## 2023-05-20 MED ORDER — LOSARTAN POTASSIUM 50 MG PO TABS: 50.0000 mg | ORAL_TABLET | Freq: Every day | ORAL | 0 refills | Status: DC

## 2023-05-20 MED ORDER — METFORMIN HCL 500 MG PO TABS: ORAL_TABLET | ORAL | 0 refills | Status: DC

## 2023-05-20 NOTE — Progress Notes (Signed)
Subjective:  Kimberly Welch is a 72 y.o. female who presents for Chief Complaint  Patient presents with   Consult    Referral to urology for kidney stones     Here for concerns from recent scan.  She notes that she goes to [facility] twice yearly.    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  So far things have been cancer free at recent visits.  She had a consult on May 18, 2023 including CT scan and labs.  At that time she apparently had complained about bladder issues, leakage with coughing and laughing.  She had a recent cold and had a whole lot of leakage during coughing spells.  Her oncologist recommended she follow-up PCP about referral to urology.  She notes no prior pelvic floor physical therapy.  She denies any bulging of the genital organs.  She notes that most the time is just mild incontinence with laughing or cough but this recent respiratory tract infection really aggravated the problem.  She also mentions kidney stones on scan  No other aggravating or relieving factors.    No other c/o.  Past Medical History:  Diagnosis Date   Cholelithiasis    symptomatic   GERD (gastroesophageal reflux disease)    Hyperlipidemia    Hypertension    Type 2 diabetes mellitus (HCC)    Wears partial dentures    UPPER AND LOWER   Current Outpatient Medications on File Prior to Visit  Medication Sig Dispense Refill   albuterol (VENTOLIN HFA) 108 (90 Base) MCG/ACT inhaler Inhale 2 puffs into the lungs every 6 (six) hours as needed.     Artificial Tear Ointment (DRY EYES OP) Place 1 drop into both eyes daily as needed (dry eyes).     aspirin EC 81 MG tablet Take 162 mg by mouth daily as needed (when eat fried foods). Swallow whole.     hydrochlorothiazide (MICROZIDE) 12.5 MG capsule TAKE 1 CAPSULE BY MOUTH EVERY DAY 90 capsule 0   ibuprofen (ADVIL) 200 MG tablet Take 1 tablet by mouth as needed.     meclizine (ANTIVERT) 25 MG tablet Take 1 tablet  (25 mg total) by mouth 3 (three) times daily as needed for dizziness. 30 tablet 0   ondansetron (ZOFRAN) 4 MG tablet Take 1 tablet by mouth as needed.     pravastatin (PRAVACHOL) 20 MG tablet TAKE 1 TABLET BY MOUTH EVERYDAY AT BEDTIME 90 tablet 1   tamsulosin (FLOMAX) 0.4 MG CAPS capsule TAKE 1 CAPSULE BY MOUTH EVERY DAY 90 capsule 1   No current facility-administered medications on file prior to visit.     The following portions of the patient's history were reviewed and updated as appropriate: allergies, current medications, past family history, past medical history, past social history, past surgical history and problem list.  ROS Otherwise as in subjective above   Objective: BP 124/70   Pulse 88   Wt 153 lb 3.2 oz (69.5 kg)   LMP 08/05/2003   BMI 30.94 kg/m   General appearance: alert, no distress, well developed, well nourished Abdomen: +bs, soft, non tender, non distended, no masses, no hepatomegaly, no splenomegaly Pulses: 2+ radial pulses, 2+ pedal pulses, normal cap refill Ext: no edema   Assessment: Encounter Diagnoses  Name Primary?   Stress incontinence of urine Yes   Essential hypertension    Uterine leiomyoma, unspecified location    History of malignant neoplasm of appendix  Plan: I reviewed her recent CT scan from 05/18/2023.  There was a comment about a punctate left kidney stone.  I reassured her about that  Referral to urology for urinary incontinence.  She is on Flomax for prior urinary hesitancy issues  She has no other pelvic or abdominal pain.  We discussed fatty liver findings on CT scan and the need to work on healthy low-fat low sugar diet  Continue current medications  Continue routine follow-up with oncology  Asya was seen today for consult.  Diagnoses and all orders for this visit:  Stress incontinence of urine -     Ambulatory referral to Urology  Essential hypertension -     losartan (COZAAR) 50 MG tablet; Take 1 tablet  (50 mg total) by mouth daily.  Uterine leiomyoma, unspecified location  History of malignant neoplasm of appendix  Other orders -     metFORMIN (GLUCOPHAGE) 500 MG tablet; TAKE 1 TAB BY MOUTH DAILY WITH BREAKFAST. ANNUAL APPT DUE IN SEPT MUST SEE PROVIDER FOR REFILLS    Follow up: with urology

## 2023-05-21 DIAGNOSIS — H40023 Open angle with borderline findings, high risk, bilateral: Secondary | ICD-10-CM | POA: Diagnosis not present

## 2023-06-03 ENCOUNTER — Ambulatory Visit (INDEPENDENT_AMBULATORY_CARE_PROVIDER_SITE_OTHER): Payer: Medicare Other | Admitting: Urology

## 2023-06-03 ENCOUNTER — Encounter: Payer: Self-pay | Admitting: Urology

## 2023-06-03 VITALS — BP 138/81 | HR 78 | Ht 59.0 in | Wt 153.0 lb

## 2023-06-03 DIAGNOSIS — N200 Calculus of kidney: Secondary | ICD-10-CM

## 2023-06-03 DIAGNOSIS — N393 Stress incontinence (female) (male): Secondary | ICD-10-CM | POA: Diagnosis not present

## 2023-06-03 LAB — URINALYSIS, ROUTINE W REFLEX MICROSCOPIC
Bilirubin, UA: NEGATIVE
Glucose, UA: NEGATIVE
Ketones, UA: NEGATIVE
Leukocytes,UA: NEGATIVE
Nitrite, UA: NEGATIVE
Protein,UA: NEGATIVE
RBC, UA: NEGATIVE
Specific Gravity, UA: 1.01 (ref 1.005–1.030)
Urobilinogen, Ur: 0.2 mg/dL (ref 0.2–1.0)
pH, UA: 6 (ref 5.0–7.5)

## 2023-06-03 NOTE — Progress Notes (Signed)
H&P  Chief Complaint: Possible kidney stone  History of Present Illness: 72 year old female comes in today, sent by [facility] in [place] for possible kidney stone.  The patient has a history of appendiceal carcinoma, status post laparoscopic appendectomy in September 2022.  She had stage I disease and has been on observation since that time.  Recent CT abdomen and pelvis on the 14th of this month performed at [facility] revealed a punctate left renal stone.  She is not currently having abdominal pain.  She denies having passed a kidney stone in the past.  She has had no gross hematuria.  She has been on tamsulosin.  She also has occasional episodes of stress incontinence, mainly with sneezing or coughing.  Not currently an issue today.  She does not wear pads.  Past Medical History:  Diagnosis Date   Cholelithiasis    symptomatic   GERD (gastroesophageal reflux disease)    Hyperlipidemia    Hypertension    Type 2 diabetes mellitus (HCC)    Wears partial dentures    UPPER AND LOWER    Past Surgical History:  Procedure Laterality Date   CHOLECYSTECTOMY N/A 11/28/2015   Procedure: LAPAROSCOPIC CHOLECYSTECTOMY;  Surgeon: Rodman Pickle, MD;  Location: [facility];  Service: General;  Laterality: N/A;   D & C HYSTEROSCOPY/  RESECTION ENDOMETRIAL POLYP'S  01-11-2009   DILATATION & CURRETTAGE/HYSTEROSCOPY WITH RESECTOCOPE N/A 04/27/2018   Procedure: DILATION  AND CURETTAGE / HYSTEROSCOPY WITH RESECTION;  Surgeon: Hal Morales, MD;  Location: [facility];  Service: Gynecology;  Laterality: N/A;   LAPAROSCOPIC APPENDECTOMY N/A 04/21/2021   Procedure: APPENDECTOMY LAPAROSCOPIC;  Surgeon: Berna Bue, MD;  Location: [facility] OR;  Service: General;  Laterality: N/A;    Home Medications:  Allergies as of 06/03/2023       Reactions   Sulfa Antibiotics Swelling        Medication List        Accurate as of June 03, 2023  8:55 AM.  If you have any questions, ask your nurse or doctor.          albuterol 108 (90 Base) MCG/ACT inhaler Commonly known as: VENTOLIN HFA Inhale 2 puffs into the lungs every 6 (six) hours as needed.   aspirin EC 81 MG tablet Take 162 mg by mouth daily as needed (when eat fried foods). Swallow whole.   DRY EYES OP Place 1 drop into both eyes daily as needed (dry eyes).   hydrochlorothiazide 12.5 MG capsule Commonly known as: MICROZIDE TAKE 1 CAPSULE BY MOUTH EVERY DAY   ibuprofen 200 MG tablet Commonly known as: ADVIL Take 1 tablet by mouth as needed.   losartan 50 MG tablet Commonly known as: COZAAR Take 1 tablet (50 mg total) by mouth daily.   meclizine 25 MG tablet Commonly known as: ANTIVERT Take 1 tablet (25 mg total) by mouth 3 (three) times daily as needed for dizziness.   metFORMIN 500 MG tablet Commonly known as: GLUCOPHAGE TAKE 1 TAB BY MOUTH DAILY WITH BREAKFAST. ANNUAL APPT DUE IN SEPT MUST SEE PROVIDER FOR REFILLS   ondansetron 4 MG tablet Commonly known as: ZOFRAN Take 1 tablet by mouth as needed.   pravastatin 20 MG tablet Commonly known as: PRAVACHOL TAKE 1 TABLET BY MOUTH EVERYDAY AT BEDTIME   tamsulosin 0.4 MG Caps capsule Commonly known as: FLOMAX TAKE 1 CAPSULE BY MOUTH EVERY DAY        Allergies:  Allergies  Allergen Reactions  Sulfa Antibiotics Swelling    Family History  Problem Relation Age of Onset   Prostate cancer Brother    Colon cancer Neg Hx    Appendicitis Neg Hx     Social History:  reports that she has never smoked. She has never used smokeless tobacco. She reports that she does not currently use alcohol. She reports that she does not use drugs.  ROS: A complete review of systems was performed.  All systems are negative except for pertinent findings as noted.  Physical Exam:  Vital signs in last 24 hours: LMP 08/05/2003  Constitutional:  Alert and oriented, No acute distress Cardiovascular: Regular rate   Respiratory: Normal respiratory effort GI: Abdomen is soft, nontender, nondistended, no abdominal masses. No CVAT.  Mildly obese. Lymphatic: No lymphadenopathy Neurologic: Grossly intact, no focal deficits Psychiatric: Normal mood and affect    I have reviewed notes from referring/previous physicians  I have reviewed urinalysis results--clear  I have independently reviewed prior imaging--CT scan images from 2022.  Most recent CT reading from 2 weeks ago reviewed  I have reviewed prior urinalyses.   Impression/Assessment:  1.  Asymptomatic left nephrolithiasis.  Small stone, no prior history  2.  Stress urinary incontinence, mild  Plan:  1.  Kegel exercises given patient  2.  She can stop her tamsulosin.  I do not think she needs further management of her asymptomatic left renal stone  3.  Office visit as needed

## 2023-06-15 ENCOUNTER — Other Ambulatory Visit: Payer: Self-pay

## 2023-06-15 ENCOUNTER — Other Ambulatory Visit: Payer: Medicare Other

## 2023-06-15 DIAGNOSIS — E119 Type 2 diabetes mellitus without complications: Secondary | ICD-10-CM

## 2023-06-15 DIAGNOSIS — Z0000 Encounter for general adult medical examination without abnormal findings: Secondary | ICD-10-CM | POA: Diagnosis not present

## 2023-06-16 ENCOUNTER — Ambulatory Visit (INDEPENDENT_AMBULATORY_CARE_PROVIDER_SITE_OTHER): Payer: Medicare Other | Admitting: Nurse Practitioner

## 2023-06-16 ENCOUNTER — Encounter: Payer: Self-pay | Admitting: Nurse Practitioner

## 2023-06-16 VITALS — BP 124/80 | HR 72 | Ht 59.0 in | Wt 153.8 lb

## 2023-06-16 DIAGNOSIS — E1169 Type 2 diabetes mellitus with other specified complication: Secondary | ICD-10-CM

## 2023-06-16 DIAGNOSIS — Z1231 Encounter for screening mammogram for malignant neoplasm of breast: Secondary | ICD-10-CM

## 2023-06-16 DIAGNOSIS — B372 Candidiasis of skin and nail: Secondary | ICD-10-CM

## 2023-06-16 DIAGNOSIS — N951 Menopausal and female climacteric states: Secondary | ICD-10-CM | POA: Diagnosis not present

## 2023-06-16 DIAGNOSIS — Z23 Encounter for immunization: Secondary | ICD-10-CM | POA: Diagnosis not present

## 2023-06-16 DIAGNOSIS — B379 Candidiasis, unspecified: Secondary | ICD-10-CM | POA: Diagnosis not present

## 2023-06-16 DIAGNOSIS — R7989 Other specified abnormal findings of blood chemistry: Secondary | ICD-10-CM | POA: Diagnosis not present

## 2023-06-16 DIAGNOSIS — I10 Essential (primary) hypertension: Secondary | ICD-10-CM

## 2023-06-16 DIAGNOSIS — Z13820 Encounter for screening for osteoporosis: Secondary | ICD-10-CM | POA: Diagnosis not present

## 2023-06-16 DIAGNOSIS — E785 Hyperlipidemia, unspecified: Secondary | ICD-10-CM

## 2023-06-16 DIAGNOSIS — H9209 Otalgia, unspecified ear: Secondary | ICD-10-CM

## 2023-06-16 DIAGNOSIS — Z0000 Encounter for general adult medical examination without abnormal findings: Secondary | ICD-10-CM | POA: Diagnosis not present

## 2023-06-16 LAB — CMP14+EGFR
ALT: 7 IU/L (ref 0–32)
AST: 16 [IU]/L (ref 0–40)
Albumin: 4.4 g/dL (ref 3.8–4.8)
Alkaline Phosphatase: 103 IU/L (ref 44–121)
BUN/Creatinine Ratio: 14 (ref 12–28)
BUN: 13 mg/dL (ref 8–27)
Bilirubin Total: 0.3 mg/dL (ref 0.0–1.2)
CO2: 24 mmol/L (ref 20–29)
Calcium: 9.9 mg/dL (ref 8.7–10.3)
Chloride: 101 mmol/L (ref 96–106)
Creatinine, Ser: 0.91 mg/dL (ref 0.57–1.00)
Globulin, Total: 2.8 g/dL (ref 1.5–4.5)
Glucose: 96 mg/dL (ref 70–99)
Potassium: 4.4 mmol/L (ref 3.5–5.2)
Sodium: 140 mmol/L (ref 134–144)
Total Protein: 7.2 g/dL (ref 6.0–8.5)
eGFR: 67 mL/min/{1.73_m2} (ref >59)

## 2023-06-16 LAB — CBC WITH DIFFERENTIAL/PLATELET
Basophils Absolute: 0.1 10*3/uL (ref 0.0–0.2)
Basos: 1 %
EOS (ABSOLUTE): 0.3 10*3/uL (ref 0.0–0.4)
Eos: 4 %
Hematocrit: 35.7 % (ref 34.0–46.6)
Hemoglobin: 12.1 g/dL (ref 11.1–15.9)
Immature Grans (Abs): 0 10*3/uL (ref 0.0–0.1)
Immature Granulocytes: 0 %
Lymphocytes Absolute: 3 10*3/uL (ref 0.7–3.1)
Lymphs: 39 %
MCH: 30.7 pg (ref 26.6–33.0)
MCHC: 33.9 g/dL (ref 31.5–35.7)
MCV: 91 fL (ref 79–97)
Monocytes Absolute: 0.6 10*3/uL (ref 0.1–0.9)
Monocytes: 7 %
Neutrophils Absolute: 3.8 10*3/uL (ref 1.4–7.0)
Neutrophils: 49 %
Platelets: 292 10*3/uL (ref 150–450)
RBC: 3.94 x10E6/uL (ref 3.77–5.28)
RDW: 12.9 % (ref 11.7–15.4)
WBC: 7.8 10*3/uL (ref 3.4–10.8)

## 2023-06-16 LAB — HEMOGLOBIN A1C
Est. average glucose Bld gHb Est-mCnc: 140 mg/dL
Hgb A1c MFr Bld: 6.5 % — ABNORMAL HIGH (ref 4.8–5.6)

## 2023-06-16 LAB — LIPID PANEL
Chol/HDL Ratio: 3.8 ratio (ref 0.0–4.4)
Cholesterol, Total: 183 mg/dL (ref 100–199)
HDL: 48 mg/dL (ref >39)
LDL Chol Calc (NIH): 117 mg/dL — ABNORMAL HIGH (ref 0–99)
Triglycerides: 98 mg/dL (ref 0–149)
VLDL Cholesterol Cal: 18 mg/dL (ref 5–40)

## 2023-06-16 LAB — TSH: TSH: 1.48 u[IU]/mL (ref 0.450–4.500)

## 2023-06-16 MED ORDER — NYSTATIN 100000 UNIT/GM EX CREA: 1.0000 | TOPICAL_CREAM | Freq: Two times a day (BID) | CUTANEOUS | 2 refills | Status: DC

## 2023-06-16 MED ORDER — FLUCONAZOLE 150 MG PO TABS: ORAL_TABLET | ORAL | 0 refills | Status: DC

## 2023-06-16 MED ORDER — MOMETASONE FUROATE 0.1 % EX CREA: TOPICAL_CREAM | CUTANEOUS | 1 refills | Status: DC

## 2023-06-16 NOTE — Patient Instructions (Signed)

## 2023-06-16 NOTE — Progress Notes (Signed)
Shawna Clamp, DNP, AGNP-c [facility] [address] [place], [place] [address] Main Office [phone]  BP 124/80   Pulse 72   Ht 4\' 11"  (1.499 m)   Wt 153 lb 12.8 oz (69.8 kg)   LMP 08/05/2003   BMI 31.06 kg/m    Subjective:    Patient ID: Kimberly Welch, female    DOB: 29-Sep-1950, 72 y.o.   MRN: [id number]  HPI: Kimberly Welch is a 72 y.o. female presenting on 06/16/2023 for comprehensive medical examination.   Quorra presents for a routine physical examination. She reports frequent earaches, particularly during cold weather, which she has experienced throughout her life. The patient also mentions a persistent rash under her breasts, which has not responded to Neosporin treatment.  She has been managing glaucoma with eye drops and has regular check-ups with her ophthalmologist.   She also reports a recent diagnosis of kidney stones, which prompted a reduction in soda consumption and an increase in water intake to support kidney health. The patient notes that the stones have since passed without any noticeable symptoms.  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 She expresses feelings of stress and occasional shortness of breath due to the demands of these responsibilities.  The patient has been proactive in her health management, scheduling regular doctor visits and adjusting her diet as needed. She has also sought to reduce stress by considering the need for additional support in her caregiving roles. Despite these challenges, the patient maintains a positive outlook and is committed to her health and well-being.  Dr. Annice Pih- eye doctor  Pertinent items are noted in HPI.  IMMUNIZATIONS:   Flu Vaccine: Flu vaccine given today Prevnar 13: Prevnar 13 completed, documentation in chart Prevnar 20: Prevnar 20 completed, documentation in chart Pneumovax 23: Pneumovax  completed, documentation in chart Vac Shingrix: Shingrix declined, patient will complete at a later date HPV: N/A or Aged Out Tetanus: Tetanus completed in the last 10 years COVID: Declined today. Information on where to obtain the vaccine was provided.  RSV: No  HEALTH MAINTENANCE: Pap Smear HM Status: N/A Mammogram HM Status: is up to date Colon Cancer Screening HM Status: is up to date Bone Density HM Status: is already scheduled STI Testing HM Status: N/A Lung CT HM Status: N/A  Concerns with vision, hearing, or dentition: No  Most Recent Depression Screen:     06/16/2023    1:34 PM 12/09/2022    2:09 PM 09/11/2022    1:16 PM 12/09/2021    2:00 PM 09/16/2021    7:54 AM  Depression screen PHQ 2/9  Decreased Interest 0 0 0 0 0  Down, Depressed, Hopeless 0 0 0 0 0  PHQ - 2 Score 0 0 0 0 0  Altered sleeping  0 0    Tired, decreased energy  1 0    Change in appetite  0 0    Feeling bad or failure about yourself   0 0    Trouble concentrating  0 0    Moving slowly or fidgety/restless  0 0    Suicidal thoughts  0 0    PHQ-9 Score  1 0    Difficult doing work/chores  Not difficult at all Not difficult at all     Most Recent Anxiety Screen:     09/11/2022    1:17 PM  GAD 7 : Generalized Anxiety Score  Nervous, Anxious,  on Edge 0  Control/stop worrying 0  Worry too much - different things 0  Trouble relaxing 0  Restless 0  Easily annoyed or irritable 0  Afraid - awful might happen 0  Total GAD 7 Score 0  Anxiety Difficulty Not difficult at all   Most Recent Fall Screen:    06/16/2023    1:34 PM 12/09/2022    2:09 PM 09/11/2022    1:16 PM 12/09/2021    2:00 PM 09/16/2021    7:54 AM  Fall Risk   Falls in the past year? 0 0 0 0 0  Number falls in past yr: 0 0 0 0 0  Injury with Fall? 0 0 0 0 0  Risk for fall due to : No Fall Risks Medication side effect No Fall Risks No Fall Risks No Fall Risks  Follow up Falls evaluation completed Falls prevention discussed;Education  provided;Falls evaluation completed Falls evaluation completed Education provided;Falls evaluation completed Falls evaluation completed    Past medical history, surgical history, medications, allergies, family history and social history reviewed with patient today and changes made to appropriate areas of the chart.  Past Medical History:  Past Medical History:  Diagnosis Date   Abdominal wall pain in left flank 10/13/2022   Acute appendicitis 04/20/2021   Cancer of appendix (HCC) 06/19/2021   Cholelithiasis    symptomatic   Ear ache 06/17/2018   GERD (gastroesophageal reflux disease)    Hyperlipidemia    Hypertension    Impacted cerumen of right ear 06/17/2018   Palpitations 07/05/2014   Pruritus of vagina 01/26/2023   Type 2 diabetes mellitus (HCC)    Wears partial dentures    UPPER AND LOWER   Medications:  Current Outpatient Medications on File Prior to Visit  Medication Sig   albuterol (VENTOLIN HFA) 108 (90 Base) MCG/ACT inhaler Inhale 2 puffs into the lungs every 6 (six) hours as needed.   Artificial Tear Ointment (DRY EYES OP) Place 1 drop into both eyes daily as needed (dry eyes).   aspirin EC 81 MG tablet Take 162 mg by mouth daily as needed (when eat fried foods). Swallow whole.   ibuprofen (ADVIL) 200 MG tablet Take 1 tablet by mouth as needed.   losartan (COZAAR) 50 MG tablet Take 1 tablet (50 mg total) by mouth daily.   meclizine (ANTIVERT) 25 MG tablet Take 1 tablet (25 mg total) by mouth 3 (three) times daily as needed for dizziness.   metFORMIN (GLUCOPHAGE) 500 MG tablet TAKE 1 TAB BY MOUTH DAILY WITH BREAKFAST. ANNUAL APPT DUE IN SEPT MUST SEE PROVIDER FOR REFILLS   ondansetron (ZOFRAN) 4 MG tablet Take 1 tablet by mouth as needed.   pravastatin (PRAVACHOL) 20 MG tablet TAKE 1 TABLET BY MOUTH EVERYDAY AT BEDTIME   No current facility-administered medications on file prior to visit.   Surgical History:  Past Surgical History:  Procedure Laterality Date    CHOLECYSTECTOMY N/A 11/28/2015   Procedure: LAPAROSCOPIC CHOLECYSTECTOMY;  Surgeon: De Blanch Kinsinger, MD;  Location: [facility];  Service: General;  Laterality: N/A;   D & C HYSTEROSCOPY/  RESECTION ENDOMETRIAL POLYP'S  01-11-2009   DILATATION & CURRETTAGE/HYSTEROSCOPY WITH RESECTOCOPE N/A 04/27/2018   Procedure: DILATION  AND CURETTAGE / HYSTEROSCOPY WITH RESECTION;  Surgeon: Hal Morales, MD;  Location: [facility];  Service: Gynecology;  Laterality: N/A;   LAPAROSCOPIC APPENDECTOMY N/A 04/21/2021   Procedure: APPENDECTOMY LAPAROSCOPIC;  Surgeon: Berna Bue, MD;  Location: [facility] OR;  Service:  General;  Laterality: N/A;   Allergies:  Allergies  Allergen Reactions   Sulfa Antibiotics Swelling   Family History:  Family History  Problem Relation Age of Onset   Prostate cancer Brother    Colon cancer Neg Hx    Appendicitis Neg Hx        Objective:    BP 124/80   Pulse 72   Ht 4\' 11"  (1.499 m)   Wt 153 lb 12.8 oz (69.8 kg)   LMP 08/05/2003   BMI 31.06 kg/m   Wt Readings from Last 3 Encounters:  06/16/23 153 lb 12.8 oz (69.8 kg)  06/03/23 153 lb (69.4 kg)  05/20/23 153 lb 3.2 oz (69.5 kg)    Physical Exam Vitals and nursing note reviewed.  Constitutional:      General: She is not in acute distress.    Appearance: Normal appearance.  HENT:     Head: Normocephalic and atraumatic.     Right Ear: Hearing, tympanic membrane, ear canal and external ear normal.     Left Ear: Hearing, tympanic membrane, ear canal and external ear normal.     Nose: Nose normal.     Right Sinus: No maxillary sinus tenderness or frontal sinus tenderness.     Left Sinus: No maxillary sinus tenderness or frontal sinus tenderness.     Mouth/Throat:     Lips: Pink.     Mouth: Mucous membranes are moist.     Pharynx: Oropharynx is clear.  Eyes:     General: Lids are normal. Vision grossly intact.     Extraocular Movements: Extraocular movements intact.      Conjunctiva/sclera: Conjunctivae normal.     Pupils: Pupils are equal, round, and reactive to light.     Funduscopic exam:    Right eye: Red reflex present.        Left eye: Red reflex present.    Visual Fields: Right eye visual fields normal and left eye visual fields normal.  Neck:     Thyroid: No thyromegaly.     Vascular: No carotid bruit.  Cardiovascular:     Rate and Rhythm: Normal rate and regular rhythm.     Chest Wall: PMI is not displaced.     Pulses: Normal pulses.          Dorsalis pedis pulses are 2+ on the right side and 2+ on the left side.       Posterior tibial pulses are 2+ on the right side and 2+ on the left side.     Heart sounds: Normal heart sounds. No murmur heard. Pulmonary:     Effort: Pulmonary effort is normal. No respiratory distress.     Breath sounds: Normal breath sounds.  Abdominal:     General: Abdomen is flat. Bowel sounds are normal. There is no distension.     Palpations: Abdomen is soft. There is no hepatomegaly, splenomegaly or mass.     Tenderness: There is no abdominal tenderness. There is no right CVA tenderness, left CVA tenderness, guarding or rebound.  Musculoskeletal:        General: Normal range of motion.     Cervical back: Full passive range of motion without pain, normal range of motion and neck supple. No tenderness.     Right lower leg: No edema.     Left lower leg: No edema.  Feet:     Left foot:     Toenail Condition: Left toenails are normal.  Lymphadenopathy:     Cervical: No  cervical adenopathy.     Upper Body:     Right upper body: No supraclavicular adenopathy.     Left upper body: No supraclavicular adenopathy.  Skin:    General: Skin is warm and dry.     Capillary Refill: Capillary refill takes less than 2 seconds.     Nails: There is no clubbing.  Neurological:     General: No focal deficit present.     Mental Status: She is alert and oriented to person, place, and time.     GCS: GCS eye subscore is 4. GCS verbal  subscore is 5. GCS motor subscore is 6.     Sensory: Sensation is intact.     Motor: Motor function is intact.     Coordination: Coordination is intact.     Gait: Gait is intact.     Deep Tendon Reflexes: Reflexes are normal and symmetric.  Psychiatric:        Attention and Perception: Attention normal.        Mood and Affect: Mood normal.        Speech: Speech normal.        Behavior: Behavior normal. Behavior is cooperative.        Thought Content: Thought content normal.        Cognition and Memory: Cognition and memory normal.        Judgment: Judgment normal.      Results for orders placed or performed in visit on 06/15/23  CBC with Differential/Platelet  Result Value Ref Range   WBC 7.8 3.4 - 10.8 x10E3/uL   RBC 3.94 3.77 - 5.28 x10E6/uL   Hemoglobin 12.1 11.1 - 15.9 g/dL   Hematocrit 16.1 09.6 - 46.6 %   MCV 91 79 - 97 fL   MCH 30.7 26.6 - 33.0 pg   MCHC 33.9 31.5 - 35.7 g/dL   RDW 04.5 40.9 - 81.1 %   Platelets 292 150 - 450 x10E3/uL   Neutrophils 49 Not Estab. %   Lymphs 39 Not Estab. %   Monocytes 7 Not Estab. %   Eos 4 Not Estab. %   Basos 1 Not Estab. %   Neutrophils Absolute 3.8 1.4 - 7.0 x10E3/uL   Lymphocytes Absolute 3.0 0.7 - 3.1 x10E3/uL   Monocytes Absolute 0.6 0.1 - 0.9 x10E3/uL   EOS (ABSOLUTE) 0.3 0.0 - 0.4 x10E3/uL   Basophils Absolute 0.1 0.0 - 0.2 x10E3/uL   Immature Granulocytes 0 Not Estab. %   Immature Grans (Abs) 0.0 0.0 - 0.1 x10E3/uL  CMP14+EGFR  Result Value Ref Range   Glucose 96 70 - 99 mg/dL   BUN 13 8 - 27 mg/dL   Creatinine, Ser 9.14 0.57 - 1.00 mg/dL   eGFR 67 >78 GN/FAO/1.30   BUN/Creatinine Ratio 14 12 - 28   Sodium 140 134 - 144 mmol/L   Potassium 4.4 3.5 - 5.2 mmol/L   Chloride 101 96 - 106 mmol/L   CO2 24 20 - 29 mmol/L   Calcium 9.9 8.7 - 10.3 mg/dL   Total Protein 7.2 6.0 - 8.5 g/dL   Albumin 4.4 3.8 - 4.8 g/dL   Globulin, Total 2.8 1.5 - 4.5 g/dL   Bilirubin Total 0.3 0.0 - 1.2 mg/dL   Alkaline Phosphatase 103 44  - 121 IU/L   AST 16 0 - 40 IU/L   ALT 7 0 - 32 IU/L  Hemoglobin A1c  Result Value Ref Range   Hgb A1c MFr Bld 6.5 (H) 4.8 - 5.6 %  Est. average glucose Bld gHb Est-mCnc 140 mg/dL  Lipid panel  Result Value Ref Range   Cholesterol, Total 183 100 - 199 mg/dL   Triglycerides 98 0 - 149 mg/dL   HDL 48 >64 mg/dL   VLDL Cholesterol Cal 18 5 - 40 mg/dL   LDL Chol Calc (NIH) 403 (H) 0 - 99 mg/dL   Chol/HDL Ratio 3.8 0.0 - 4.4 ratio  TSH  Result Value Ref Range   TSH 1.480 0.450 - 4.500 uIU/mL       Assessment & Plan:   Problem List Items Addressed This Visit     Essential hypertension (Chronic)    Blood pressure is well-controlled. Labs are within normal limits. No alarm symptoms present at this time.  - Continue current medications - Encourage increased water intake       Type 2 diabetes mellitus (HCC)    Weak bladder and occasional leakage. Previously on tamsulosin, currently stopped. Discussed holding tamsulosin and monitoring symptoms. If symptoms recur, will restart medication. - Hold tamsulosin - Monitor symptoms and restart if necessary      Encounter for annual physical exam - Primary    CPE completed today. Review of HM activities and recommendations discussed and provided on AVS. Anticipatory guidance, diet, and exercise recommendations provided. Medications, allergies, and hx reviewed and updated as necessary. Orders placed as listed below.  Plan: - Labs ordered. Will make changes as necessary based on results.  - I will review these results and send recommendations via MyChart or a telephone call.  - F/U with CPE in 1 year or sooner for acute/chronic health needs as directed.        Elevated LFTs    Repeat labs today for monitoring.       Earache    Chronic earaches, exacerbated by cold weather. - Advise wearing a hat and using cotton in ears during cold weather      Candidal intertrigo    Rash under breasts identified as a yeast infection. Patient has  been using Neosporin without relief. - Prescribe antifungal cream      Relevant Medications   fluconazole (DIFLUCAN) 150 MG tablet   nystatin cream (MYCOSTATIN)   mometasone (ELOCON) 0.1 % cream   Menopausal and female climacteric states    DEXA ordered for increased risk due to post-menopausal status.       Relevant Orders   DG Bone Density   Hyperlipidemia associated with type 2 diabetes mellitus (HCC)   Other Visit Diagnoses     Need for influenza vaccination       Relevant Orders   Flu Vaccine Trivalent High Dose (Fluad) (Completed)   Screening mammogram for breast cancer       Relevant Orders   MM 3D SCREENING MAMMOGRAM BILATERAL BREAST   Screening for osteoporosis       Relevant Orders   DG Bone Density         Follow up plan: Return for covid couple weeks. med check 6 months.  NEXT PREVENTATIVE PHYSICAL DUE IN 1 YEAR.  PATIENT COUNSELING PROVIDED FOR ALL ADULT PATIENTS: A well balanced diet low in saturated fats, cholesterol, and moderation in carbohydrates.  This can be as simple as monitoring portion sizes and cutting back on sugary beverages such as soda and juice to start with.    Daily water consumption of at least 64 ounces.  Physical activity at least 180 minutes per week.  If just starting out, start 10 minutes a day and work your  way up.   This can be as simple as taking the stairs instead of the elevator and walking 2-3 laps around the office  purposefully every day.   STD protection, partner selection, and regular testing if high risk.  Limited consumption of alcoholic beverages if alcohol is consumed. For men, I recommend no more than 14 alcoholic beverages per week, spread out throughout the week (max 2 per day). Avoid "binge" drinking or consuming large quantities of alcohol in one setting.  Please let me know if you feel you may need help with reduction or quitting alcohol consumption.   Avoidance of nicotine, if used. Please let me know if  you feel you may need help with reduction or quitting nicotine use.   Daily mental health attention. This can be in the form of 5 minute daily meditation, prayer, journaling, yoga, reflection, etc.  Purposeful attention to your emotions and mental state can significantly improve your overall wellbeing  and  Health.  Please know that I am here to help you with all of your health care goals and am happy to work with you to find a solution that works best for you.  The greatest advice I have received with any changes in life are to take it one step at a time, that even means if all you can focus on is the next 60 seconds, then do that and celebrate your victories.  With any changes in life, you will have set backs, and that is OK. The important thing to remember is, if you have a set back, it is not a failure, it is an opportunity to try again! Screening Testing Mammogram Every 1 -2 years based on history and risk factors Starting at age 17 Pap Smear Ages 21-39 every 3 years Ages 24-65 every 5 years with HPV testing More frequent testing may be required based on results and history Colon Cancer Screening Every 1-10 years based on test performed, risk factors, and history Starting at age 71 Bone Density Screening Every 2-10 years based on history Starting at age 36 for women Recommendations for men differ based on medication usage, history, and risk factors AAA Screening One time ultrasound Men 52-35 years old who have every smoked Lung Cancer Screening Low Dose Lung CT every 12 months Age 38-80 years with a 30 pack-year smoking history who still smoke or who have quit within the last 15 years   Screening Labs Routine  Labs: Complete Blood Count (CBC), Complete Metabolic Panel (CMP), Cholesterol (Lipid Panel) Every 6-12 months based on history and medications May be recommended more frequently based on current conditions or previous results Hemoglobin A1c Lab Every 3-12 months based  on history and previous results Starting at age 59 or earlier with diagnosis of diabetes, high cholesterol, BMI >26, and/or risk factors Frequent monitoring for patients with diabetes to ensure blood sugar control Thyroid Panel (TSH) Every 6 months based on history, symptoms, and risk factors May be repeated more often if on medication HIV One time testing for all patients 61 and older May be repeated more frequently for patients with increased risk factors or exposure Hepatitis C One time testing for all patients 65 and older May be repeated more frequently for patients with increased risk factors or exposure Gonorrhea, Chlamydia Every 12 months for all sexually active persons 13-24 years Additional monitoring may be recommended for those who are considered high risk or who have symptoms Every 12 months for any woman on birth control, regardless of sexual  activity PSA Men 38-43 years old with risk factors Additional screening may be recommended from age 47-69 based on risk factors, symptoms, and history  Vaccine Recommendations Tetanus Booster All adults every 10 years Flu Vaccine All patients 6 months and older every year COVID Vaccine All patients 12 years and older Initial dosing with booster May recommend additional booster based on age and health history HPV Vaccine 2 doses all patients age 31-26 Dosing may be considered for patients over 26 Shingles Vaccine (Shingrix) 2 doses all adults 55 years and older Pneumonia (Pneumovax 24) All adults 65 years and older May recommend earlier dosing based on health history One year apart from Prevnar 13 Pneumonia (Prevnar 22) All adults 65 years and older Dosed 1 year after Pneumovax 23 Pneumonia (Prevnar 20) One time alternative to the two dosing of 13 and 23 For all adults with initial dose of 23, 20 is recommended 1 year later For all adults with initial dose of 13, 23 is still recommended as second option 1 year  later

## 2023-06-24 ENCOUNTER — Encounter: Payer: Self-pay | Admitting: Nurse Practitioner

## 2023-06-24 DIAGNOSIS — N951 Menopausal and female climacteric states: Secondary | ICD-10-CM | POA: Insufficient documentation

## 2023-06-24 DIAGNOSIS — B372 Candidiasis of skin and nail: Secondary | ICD-10-CM

## 2023-06-24 HISTORY — DX: Menopausal and female climacteric states: N95.1

## 2023-06-24 HISTORY — DX: Candidiasis of skin and nail: B37.2

## 2023-06-24 NOTE — Assessment & Plan Note (Signed)
DEXA ordered for increased risk due to post-menopausal status.

## 2023-06-24 NOTE — Assessment & Plan Note (Signed)
Rash under breasts identified as a yeast infection. Patient has been using Neosporin without relief. - Prescribe antifungal cream

## 2023-06-24 NOTE — Assessment & Plan Note (Signed)
Weak bladder and occasional leakage. Previously on tamsulosin, currently stopped. Discussed holding tamsulosin and monitoring symptoms. If symptoms recur, will restart medication. - Hold tamsulosin - Monitor symptoms and restart if necessary

## 2023-06-24 NOTE — Assessment & Plan Note (Signed)

## 2023-06-24 NOTE — Assessment & Plan Note (Signed)
Repeat labs today for monitoring.

## 2023-06-24 NOTE — Assessment & Plan Note (Signed)
Chronic earaches, exacerbated by cold weather. - Advise wearing a hat and using cotton in ears during cold weather

## 2023-06-24 NOTE — Assessment & Plan Note (Addendum)
Blood pressure is well-controlled. Labs are within normal limits. No alarm symptoms present at this time.  - Continue current medications - Encourage increased water intake

## 2023-07-08 ENCOUNTER — Other Ambulatory Visit (INDEPENDENT_AMBULATORY_CARE_PROVIDER_SITE_OTHER): Payer: Medicare Other

## 2023-07-08 DIAGNOSIS — Z23 Encounter for immunization: Secondary | ICD-10-CM | POA: Diagnosis not present

## 2023-07-21 ENCOUNTER — Ambulatory Visit
Admission: RE | Admit: 2023-07-21 | Discharge: 2023-07-21 | Disposition: A | Discharge status: Home / Self Care | Payer: Medicare Other | Source: Ambulatory Visit | Attending: Nurse Practitioner

## 2023-07-21 DIAGNOSIS — Z1231 Encounter for screening mammogram for malignant neoplasm of breast: Secondary | ICD-10-CM | POA: Diagnosis not present

## 2023-08-19 ENCOUNTER — Other Ambulatory Visit: Payer: Self-pay | Admitting: Nurse Practitioner

## 2023-08-19 DIAGNOSIS — I10 Essential (primary) hypertension: Secondary | ICD-10-CM

## 2023-08-20 ENCOUNTER — Other Ambulatory Visit: Payer: Self-pay | Admitting: Nurse Practitioner

## 2023-08-20 LAB — HM DIABETES EYE EXAM

## 2023-09-09 ENCOUNTER — Ambulatory Visit (INDEPENDENT_AMBULATORY_CARE_PROVIDER_SITE_OTHER): Payer: Medicare Other | Admitting: Medical

## 2023-09-09 ENCOUNTER — Ambulatory Visit: Payer: Federal, State, Local not specified - PPO | Attending: Medical

## 2023-09-09 VITALS — BP 110/70 | HR 77 | Temp 97.2°F | Resp 97 | Wt 154.4 lb

## 2023-09-09 DIAGNOSIS — R002 Palpitations: Secondary | ICD-10-CM

## 2023-09-09 DIAGNOSIS — E1169 Type 2 diabetes mellitus with other specified complication: Secondary | ICD-10-CM | POA: Diagnosis not present

## 2023-09-09 DIAGNOSIS — I10 Essential (primary) hypertension: Secondary | ICD-10-CM | POA: Diagnosis not present

## 2023-09-09 DIAGNOSIS — F419 Anxiety disorder, unspecified: Secondary | ICD-10-CM | POA: Diagnosis not present

## 2023-09-09 NOTE — Progress Notes (Signed)
 Subjective:  Kimberly Welch is a 73 y.o. female who presents for Chief Complaint  Patient presents with   Palpitations    Heart palpitations for x 2 weeks. Doesn't happen every day, but under a lot of stress, Has family history of heart issues     Here for complaint of palpitations.  She has been feeling on and off brief palpitations for the last 3 weeks.  She has been under a lot of stress.  She takes care of her special needs brother, one of her good friends got diagnosed with dementia recently and having hard time dealing with that, and she helps look after her 67 year old niece.  Her 3-year-old niece's mother up and left the country and left her niece in the care of her grandmother.  So they have had some different family stressors lately  Kimberly Welch had 1 day recently where she felt some numbness in the left arm and left leg but that went away briefly  He has had some occasional nausea but no chest pain, shortness of breath, edema, dizziness, confusion, slurred speech, facial numbness or other worrisome symptoms.  Otherwise normal state of health.  No other aggravating or relieving factors.    No other c/o.  Past Medical History:  Diagnosis Date   Abdominal wall pain in left flank 10/13/2022   Acute appendicitis 04/20/2021   Cancer of appendix (HCC) 06/19/2021   Cholelithiasis    symptomatic   Ear ache 06/17/2018   GERD (gastroesophageal reflux disease)    Hyperlipidemia    Hypertension    Impacted cerumen of right ear 06/17/2018   Palpitations 07/05/2014   Pruritus of vagina 01/26/2023   Type 2 diabetes mellitus (HCC)    Wears partial dentures    UPPER AND LOWER   Current Outpatient Medications on File Prior to Visit  Medication Sig Dispense Refill   albuterol (VENTOLIN HFA) 108 (90 Base) MCG/ACT inhaler Inhale 2 puffs into the lungs every 6 (six) hours as needed.     Artificial Tear Ointment (DRY EYES OP) Place 1 drop into both eyes daily as needed (dry eyes).     aspirin EC  81 MG tablet Take 162 mg by mouth daily as needed (when eat fried foods). Swallow whole.     ibuprofen  (ADVIL ) 200 MG tablet Take 1 tablet by mouth as needed.     losartan  (COZAAR ) 50 MG tablet TAKE 1 TABLET BY MOUTH EVERY DAY 90 tablet 1   meclizine  (ANTIVERT ) 25 MG tablet Take 1 tablet (25 mg total) by mouth 3 (three) times daily as needed for dizziness. 30 tablet 0   metFORMIN  (GLUCOPHAGE ) 500 MG tablet TAKE 1 TAB BY MOUTH DAILY WITH BREAKFAST. ANNUAL APPT DUE IN SEPT MUST SEE PROVIDER FOR REFILLS 90 tablet 1   mometasone  (ELOCON ) 0.1 % cream Apply twice a day under the breasts for itching. Use for 7 days. Repeat if the symptoms return. 45 g 1   nystatin  cream (MYCOSTATIN ) Apply 1 Application topically 2 (two) times daily. Apply twice a day under the breasts for infection. Use for 7 days. Repeat if the symptoms return. 30 g 2   pravastatin  (PRAVACHOL ) 20 MG tablet TAKE 1 TABLET BY MOUTH EVERYDAY AT BEDTIME 90 tablet 1   ondansetron  (ZOFRAN ) 4 MG tablet Take 1 tablet by mouth as needed. (Patient not taking: Reported on 09/09/2023)     No current facility-administered medications on file prior to visit.     The following portions of the patient's history were reviewed and  updated as appropriate: allergies, current medications, past family history, past medical history, past social history, past surgical history and problem list.  ROS Otherwise as in subjective above  Objective: BP 110/70   Pulse 77   Temp (!) 97.2 F (36.2 C)   Resp (!) 97   Wt 154 lb 6.4 oz (70 kg)   LMP 08/05/2003   BMI 31.19 kg/m   General appearance: alert, no distress, well developed, well nourished HEENT: normocephalic, sclerae anicteric, conjunctiva pink and moist, TMs pearly, nares patent, no discharge or erythema, pharynx normal Oral cavity: MMM, no lesions Neck: supple, no lymphadenopathy, no thyromegaly, no masses, no bruits Heart: RRR, normal S1, S2, no murmurs Lungs: CTA bilaterally, no wheezes,  rhonchi, or rales Pulses: 2+ radial pulses, 2+ pedal pulses, normal cap refill Ext: no edema Psych, pleasant, answers questions appropriately Neuro: CN II through XII intact, nonfocal exam  EKG reviewed   Assessment: Encounter Diagnoses  Name Primary?   Heart palpitations Yes   Anxiety    Essential hypertension    Type 2 diabetes mellitus with other specified complication, without long-term current use of insulin  (HCC)      Plan: We discussed her concerns.  EKG normal today.  We will go further and do a ZIO weeklong event monitor in the event any other arrhythmias  In the meantime discussed deep breathing exercises, reduce stress for possible.  If the ZIO does not show anything abnormal we discussed possibly using beta-blocker such as propranolol to help with palpitations and nervous feeling  I reviewed blood work she just had done here in November 2024 and she was not anemic, thyroid  normal, electrolytes okay  She had questions about stroke and evaluation.  I offered carotid ultrasound or CT coronary to further evaluate for cardiovascular disease.  She declines for now  We discussed symptoms of stroke or TIA that would prompt a call 911 as well as MI symptoms that would prompt a call to #[phone]  Brizza was seen today for palpitations.  Diagnoses and all orders for this visit:  Heart palpitations -     LONG TERM MONITOR (3-14 DAYS); Future -     EKG 12-Lead  Anxiety  Essential hypertension  Type 2 diabetes mellitus with other specified complication, without long-term current use of insulin  (HCC)    Follow up: pending zio monitor

## 2023-09-09 NOTE — Progress Notes (Unsigned)
 EP to read.

## 2023-09-10 ENCOUNTER — Encounter: Payer: Self-pay | Admitting: Nurse Practitioner

## 2023-09-14 ENCOUNTER — Other Ambulatory Visit: Payer: Medicare Other

## 2023-09-14 DIAGNOSIS — R002 Palpitations: Secondary | ICD-10-CM

## 2023-10-11 NOTE — Progress Notes (Signed)
 The ZIO monitor did not show any significant abnormal heart rhythms.  As we discussed reduce stress where possible  If you are still having quite a few palpitations recommended possibly starting propranolol or metoprolol beta-blocker medication to help with palpitations.  Let me know if you need to do this

## 2023-12-15 ENCOUNTER — Ambulatory Visit (INDEPENDENT_AMBULATORY_CARE_PROVIDER_SITE_OTHER): Payer: Medicare Other

## 2023-12-15 DIAGNOSIS — Z0000 Encounter for general adult medical examination without abnormal findings: Secondary | ICD-10-CM

## 2023-12-15 NOTE — Progress Notes (Signed)
 Subjective:   Davanee Saley is a 73 y.o. who presents for a Medicare Wellness preventive visit.  As a reminder, Annual Wellness Visits don't include a physical exam, and some assessments may be limited, especially if this visit is performed virtually. We may recommend an in-person visit if needed.  Visit Complete: Virtual I connected with  Bradee Tu on 12/15/23 by a audio enabled telemedicine application and verified that I am speaking with the correct person using two identifiers.  Patient Location: Home  Provider Location: Office/Clinic  I discussed the limitations of evaluation and management by telemedicine. The patient expressed understanding and agreed to proceed.  Vital Signs: Because this visit was a virtual/telehealth visit, some criteria may be missing or patient reported. Any vitals not documented were not able to be obtained and vitals that have been documented are patient reported.  VideoError- Librarian, academic were attempted between this provider and patient, however failed, due to patient having technical difficulties OR patient did not have access to video capability.  We continued and completed visit with audio only.   Persons Participating in Visit: Patient.  AWV Questionnaire: No: Patient Medicare AWV questionnaire was not completed prior to this visit.  Cardiac Risk Factors include: advanced age (>74men, >53 women);diabetes mellitus;dyslipidemia;hypertension     Objective:     Today's Vitals   There is no height or weight on file to calculate BMI.     12/15/2023    1:33 PM 12/09/2022    2:09 PM 06/17/2021   12:14 PM 04/27/2020    8:26 PM 04/27/2018    6:19 AM 01/06/2017    3:13 PM 11/28/2015    8:38 AM  Advanced Directives  Does Patient Have a Medical Advance Directive? No No No No No No No  Would patient like information on creating a medical advance directive?  No - Patient declined No - Patient declined No - Patient declined  Yes (MAU/Ambulatory/Procedural Areas - Information given) Yes (ED - Information included in AVS) Yes - Educational materials given    Current Medications (verified) Outpatient Encounter Medications as of 12/15/2023  Medication Sig   albuterol (VENTOLIN HFA) 108 (90 Base) MCG/ACT inhaler Inhale 2 puffs into the lungs every 6 (six) hours as needed.   Artificial Tear Ointment (DRY EYES OP) Place 1 drop into both eyes daily as needed (dry eyes).   aspirin EC 81 MG tablet Take 162 mg by mouth daily as needed (when eat fried foods). Swallow whole.   ibuprofen  (ADVIL ) 200 MG tablet Take 1 tablet by mouth as needed.   losartan  (COZAAR ) 50 MG tablet TAKE 1 TABLET BY MOUTH EVERY DAY   meclizine  (ANTIVERT ) 25 MG tablet Take 1 tablet (25 mg total) by mouth 3 (three) times daily as needed for dizziness.   metFORMIN  (GLUCOPHAGE ) 500 MG tablet TAKE 1 TAB BY MOUTH DAILY WITH BREAKFAST. ANNUAL APPT DUE IN SEPT MUST SEE PROVIDER FOR REFILLS   mometasone  (ELOCON ) 0.1 % cream Apply twice a day under the breasts for itching. Use for 7 days. Repeat if the symptoms return.   nystatin  cream (MYCOSTATIN ) Apply 1 Application topically 2 (two) times daily. Apply twice a day under the breasts for infection. Use for 7 days. Repeat if the symptoms return.   ondansetron  (ZOFRAN ) 4 MG tablet Take 1 tablet by mouth as needed.   pravastatin  (PRAVACHOL ) 20 MG tablet TAKE 1 TABLET BY MOUTH EVERYDAY AT BEDTIME   No facility-administered encounter medications on file as of 12/15/2023.  Allergies (verified) Sulfa antibiotics   History: Past Medical History:  Diagnosis Date   Abdominal wall pain in left flank 10/13/2022   Acute appendicitis 04/20/2021   Cancer of appendix (HCC) 06/19/2021   Cholelithiasis    symptomatic   Ear ache 06/17/2018   GERD (gastroesophageal reflux disease)    Hyperlipidemia    Hypertension    Impacted cerumen of right ear 06/17/2018   Palpitations 07/05/2014   Pruritus of vagina 01/26/2023    Type 2 diabetes mellitus (HCC)    Wears partial dentures    UPPER AND LOWER   Past Surgical History:  Procedure Laterality Date   CHOLECYSTECTOMY N/A 11/28/2015   Procedure: LAPAROSCOPIC CHOLECYSTECTOMY;  Surgeon: Derral Flick, MD;  Location: [facility];  Service: General;  Laterality: N/A;   D & C HYSTEROSCOPY/  RESECTION ENDOMETRIAL POLYP'S  01-11-2009   DILATATION & CURRETTAGE/HYSTEROSCOPY WITH RESECTOCOPE N/A 04/27/2018   Procedure: DILATION  AND CURETTAGE / HYSTEROSCOPY WITH RESECTION;  Surgeon: Stevenson Elbe, MD;  Location: [facility];  Service: Gynecology;  Laterality: N/A;   LAPAROSCOPIC APPENDECTOMY N/A 04/21/2021   Procedure: APPENDECTOMY LAPAROSCOPIC;  Surgeon: Adalberto Acton, MD;  Location: [facility];  Service: General;  Laterality: N/A;   Family History  Problem Relation Age of Onset   Prostate cancer Brother    Colon cancer Neg Hx    Appendicitis Neg Hx    Social History   Socioeconomic History   Marital status: Significant Other    Spouse name: Not on file   Number of children: Not on file   Years of education: Not on file   Highest education level: Not on file  Occupational History   Not on file  Tobacco Use   Smoking status: Never   Smokeless tobacco: Never  Vaping Use   Vaping status: Never Used  Substance and Sexual Activity   Alcohol use: Not Currently    Comment: RARE   Drug use: No   Sexual activity: Not on file  Other Topics Concern   Not on file  Social History Narrative   Not on file   Social Drivers of Health   Financial Resource Strain: Low Risk  (12/15/2023)   Overall Financial Resource Strain (CARDIA)    Difficulty of Paying Living Expenses: Not hard at all  Food Insecurity: No Food Insecurity (12/15/2023)   Hunger Vital Sign    Worried About Running Out of Food in the Last Year: Never true    Ran Out of Food in the Last Year: Never true  Transportation Needs: No Transportation Needs (12/15/2023)    PRAPARE - Administrator, Civil Service (Medical): No    Lack of Transportation (Non-Medical): No  Physical Activity: Insufficiently Active (12/15/2023)   Exercise Vital Sign    Days of Exercise per Week: 3 days    Minutes of Exercise per Session: 30 min  Stress: Stress Concern Present (12/15/2023)   [employer] of Occupational Health - Occupational Stress Questionnaire    Feeling of Stress : To some extent  Social Connections: Socially Isolated (12/15/2023)   Social Connection and Isolation Panel [NHANES]    Frequency of Communication with Friends and Family: More than three times a week    Frequency of Social Gatherings with Friends and Family: More than three times a week    Attends Religious Services: Never    Database administrator or Organizations: No    Attends Banker Meetings: Never  Marital Status: Never married    Tobacco Counseling Counseling given: Not Answered    Clinical Intake:  Pre-visit preparation completed: Yes  Pain : No/denies pain     Nutritional Risks: Nausea/ vomitting/ diarrhea (nausea in the morning) Diabetes: Yes CBG done?: No Did pt. bring in CBG monitor from home?: No  Lab Results  Component Value Date   HGBA1C 6.5 (H) 06/15/2023   HGBA1C 6.3 (H) 09/11/2022   HGBA1C 6.4 (H) 12/09/2021     How often do you need to have someone help you when you read instructions, pamphlets, or other written materials from your doctor or pharmacy?: 1 - Never  Interpreter Needed?: No  Information entered by :: NAllen LPN   Activities of Daily Living     12/15/2023    1:24 PM  In your present state of health, do you have any difficulty performing the following activities:  Hearing? 0  Vision? 0  Difficulty concentrating or making decisions? 0  Walking or climbing stairs? 0  Dressing or bathing? 0  Doing errands, shopping? 0  Preparing Food and eating ? N  Using the Toilet? N  In the past six months, have you  accidently leaked urine? Y  Comment wears a pad  Do you have problems with loss of bowel control? Y  Comment happened once or twice  Managing your Medications? N  Managing your Finances? N  Housekeeping or managing your Housekeeping? N    Patient Care Team: Early, Adriane Albe, NP as PCP - General (Nurse Practitioner) Stevenson Elbe, MD (Inactive) as Consulting Physician (Obstetrics and Gynecology) Kenney Peacemaker, MD as Consulting Physician (Gastroenterology)  Indicate any recent Medical Services you may have received from other than [facility] providers in the past year (date may be approximate).     Assessment:    This is a routine wellness examination for Briea.  Hearing/Vision screen Hearing Screening - Comments:: Denies hearing issues Vision Screening - Comments:: Regular eye exams, Dr. Wyvonna Heidelberg   Goals Addressed             This Visit's Progress    Patient Stated       12/15/2023, stay healthy enough to stay alive       Depression Screen     12/15/2023    1:36 PM 06/16/2023    1:34 PM 12/09/2022    2:09 PM 09/11/2022    1:16 PM 12/09/2021    2:00 PM 09/16/2021    7:54 AM 04/20/2020    3:52 PM  PHQ 2/9 Scores  PHQ - 2 Score 0 0 0 0 0 0 1  PHQ- 9 Score 3  1 0     Exception Documentation    Medical reason Medical reason      Fall Risk     12/15/2023    1:35 PM 06/16/2023    1:34 PM 12/09/2022    2:09 PM 09/11/2022    1:16 PM 12/09/2021    2:00 PM  Fall Risk   Falls in the past year? 0 0 0 0 0  Number falls in past yr: 0 0 0 0 0  Injury with Fall? 0 0 0 0 0  Risk for fall due to : Medication side effect No Fall Risks Medication side effect No Fall Risks No Fall Risks  Follow up Falls prevention discussed;Falls evaluation completed Falls evaluation completed Falls prevention discussed;Education provided;Falls evaluation completed Falls evaluation completed Education provided;Falls evaluation completed    MEDICARE RISK AT HOME:  Medicare Risk  at Home Any stairs in or around  the home?: Yes If so, are there any without handrails?: No Home free of loose throw rugs in walkways, pet beds, electrical cords, etc?: Yes Adequate lighting in your home to reduce risk of falls?: Yes Life alert?: No Use of a cane, walker or w/c?: No Grab bars in the bathroom?: No Shower chair or bench in shower?: No Elevated toilet seat or a handicapped toilet?: Yes  TIMED UP AND GO:  Was the test performed?  No  Cognitive Function: 6CIT completed        12/15/2023    1:37 PM 12/09/2022    2:15 PM  6CIT Screen  What Year? 0 points 0 points  What month? 0 points 0 points  What time? 0 points 0 points  Count back from 20 0 points 0 points  Months in reverse 0 points 0 points  Repeat phrase 0 points 0 points  Total Score 0 points 0 points    Immunizations Immunization History  Administered Date(s) Administered   Fluad Quad(high Dose 65+) 03/28/2019, 04/12/2021, 09/11/2022   Fluad Trivalent(High Dose 65+) 06/16/2023   PFIZER(Purple Top)SARS-COV-2 Vaccination 09/26/2019, 10/21/2019, 06/22/2020   Pfizer Covid-19 Vaccine Bivalent Booster 73yrs & up 06/20/2021   Pfizer(Comirnaty)Fall Seasonal Vaccine 12 years and older 07/08/2023   Pneumococcal Conjugate-13 02/09/2018   Pneumococcal Polysaccharide-23 10/03/2020   Tdap 02/09/2018   Zoster Recombinant(Shingrix ) 03/11/2022    Screening Tests Health Maintenance  Topic Date Due   DEXA SCAN  Never done   Zoster Vaccines- Shingrix  (2 of 2) 05/06/2022   Diabetic kidney evaluation - Urine ACR  09/12/2023   HEMOGLOBIN A1C  12/13/2023   COVID-19 Vaccine (6 - Pfizer risk 2024-25 season) 01/06/2024   INFLUENZA VACCINE  03/04/2024   Diabetic kidney evaluation - eGFR measurement  06/14/2024   FOOT EXAM  06/15/2024   OPHTHALMOLOGY EXAM  08/19/2024   Medicare Annual Wellness (AWV)  12/14/2024   MAMMOGRAM  07/20/2025   DTaP/Tdap/Td (2 - Td or Tdap) 02/10/2028   Colonoscopy  11/02/2031   Pneumonia Vaccine 42+ Years old  Completed    Hepatitis C Screening  Completed   HPV VACCINES  Aged Out   Meningococcal B Vaccine  Aged Out    Health Maintenance  Health Maintenance Due  Topic Date Due   DEXA SCAN  Never done   Zoster Vaccines- Shingrix  (2 of 2) 05/06/2022   Diabetic kidney evaluation - Urine ACR  09/12/2023   HEMOGLOBIN A1C  12/13/2023   Health Maintenance Items Addressed: Needs second dose of Shingrix .  Additional Screening:  Vision Screening: Recommended annual ophthalmology exams for early detection of glaucoma and other disorders of the eye.  Dental Screening: Recommended annual dental exams for proper oral hygiene  Community Resource Referral / Chronic Care Management: CRR required this visit?  No   CCM required this visit?  No   Plan:    I have personally reviewed and noted the following in the patient's chart:   Medical and social history Use of alcohol, tobacco or illicit drugs  Current medications and supplements including opioid prescriptions. Patient is not currently taking opioid prescriptions. Functional ability and status Nutritional status Physical activity Advanced directives List of other physicians Hospitalizations, surgeries, and ER visits in previous 12 months Vitals Screenings to include cognitive, depression, and falls Referrals and appointments  In addition, I have reviewed and discussed with patient certain preventive protocols, quality metrics, and best practice recommendations. A written personalized care plan for preventive services as  well as general preventive health recommendations were provided to patient.   Areatha Beecham, LPN   10/25/4008   After Visit Summary: (Pick Up) Due to this being a telephonic visit, with patients personalized plan was offered to patient and patient has requested to Pick up at office.  Notes: Nothing significant to report at this time.

## 2023-12-15 NOTE — Patient Instructions (Signed)
 Ms. Kimberly Welch , Thank you for taking time out of your busy schedule to complete your Annual Wellness Visit with me. I enjoyed our conversation and look forward to speaking with you again next year. I, as well as your care team,  appreciate your ongoing commitment to your health goals. Please review the following plan we discussed and let me know if I can assist you in the future. Your Game plan/ To Do List    Referrals: If you haven't heard from the office you've been referred to, please reach out to them at the phone provided.  N/a Follow up Visits: Next Medicare AWV with our clinical staff: 12/20/2024 at 1:30   Have you seen your provider in the last 6 months (3 months if uncontrolled diabetes)? Yes Next Office Visit with your provider: 01/26/2024 at 11:15  Clinician Recommendations:  Aim for 30 minutes of exercise or brisk walking, 6-8 glasses of water, and 5 servings of fruits and vegetables each day.       This is a list of the screening recommended for you and due dates:  Health Maintenance  Topic Date Due   DEXA scan (bone density measurement)  Never done   Zoster (Shingles) Vaccine (2 of 2) 05/06/2022   Yearly kidney health urinalysis for diabetes  09/12/2023   Hemoglobin A1C  12/13/2023   COVID-19 Vaccine (6 - Pfizer risk 2024-25 season) 01/06/2024   Flu Shot  03/04/2024   Yearly kidney function blood test for diabetes  06/14/2024   Complete foot exam   06/15/2024   Eye exam for diabetics  08/19/2024   Medicare Annual Wellness Visit  12/14/2024   Mammogram  07/20/2025   DTaP/Tdap/Td vaccine (2 - Td or Tdap) 02/10/2028   Colon Cancer Screening  11/02/2031   Pneumonia Vaccine  Completed   Hepatitis C Screening  Completed   HPV Vaccine  Aged Out   Meningitis B Vaccine  Aged Out    Advanced directives: (ACP Link)Information on Advanced Care Planning can be found at [place]  Print production planner Health Care Directives Advance Health Care Directives. [email or web address]  Advance  Care Planning is important because it:  [x]  Makes sure you receive the medical care that is consistent with your values, goals, and preferences  [x]  It provides guidance to your family and loved ones and reduces their decisional burden about whether or not they are making the right decisions based on your wishes.  Follow the link provided in your after visit summary or read over the paperwork we have mailed to you to help you started getting your Advance Directives in place. If you need assistance in completing these, please reach out to us  so that we can help you!  See attachments for Preventive Care and Fall Prevention Tips.

## 2023-12-23 ENCOUNTER — Encounter: Payer: Federal, State, Local not specified - PPO | Admitting: Nurse Practitioner

## 2023-12-23 ENCOUNTER — Encounter: Payer: Self-pay | Admitting: Podiatrist

## 2023-12-23 ENCOUNTER — Ambulatory Visit (INDEPENDENT_AMBULATORY_CARE_PROVIDER_SITE_OTHER): Admitting: Podiatrist

## 2023-12-23 DIAGNOSIS — S90221A Contusion of right lesser toe(s) with damage to nail, initial encounter: Secondary | ICD-10-CM | POA: Diagnosis not present

## 2023-12-23 NOTE — Progress Notes (Signed)
 Chief Complaint  Patient presents with   Nail Problem    "My right toenail, it looks like the nail is halfway broken." N - toenail L - hallux right D - 1 week O - suddenly C - sore, Diabetic, dark around the nail A - none T - rubbed some antibiotic cream around it     HPI: Patient is 73 y.o. female who presents today for a broken right hallux nail. Happened about 2 weeks ago when she got a pedicure. The nail is loose and sore.    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   Atrophy of vagina 09/16/2021   Gastroesophageal reflux disease 09/16/2021   BPPV (benign paroxysmal positional vertigo) 12/28/2020   Earache 06/17/2018   Vertigo 06/17/2018   Uterine leiomyoma 04/27/2018   Atypical squamous cells cannot exclude high grade squamous intraepithelial lesion on cytologic smear of cervix (ASC-H) 04/27/2018   Glaucoma 01/26/2018   Hemorrhoid 05/01/2016   Elevated LFTs 10/09/2015   Type 2 diabetes mellitus (HCC) 07/05/2014   Hyperlipidemia associated with type 2 diabetes mellitus (HCC) 07/05/2014   Essential hypertension 07/05/2014   Encounter for annual physical exam 07/05/2014    Current Outpatient Medications on File Prior to Visit  Medication Sig Dispense Refill   albuterol (VENTOLIN HFA) 108 (90 Base) MCG/ACT inhaler Inhale 2 puffs into the lungs every 6 (six) hours as needed.     Artificial Tear Ointment (DRY EYES OP) Place 1 drop into both eyes daily as needed (dry eyes).     aspirin EC 81 MG tablet Take 162 mg by mouth daily as needed (when eat fried foods). Swallow whole.     ibuprofen  (ADVIL ) 200 MG tablet Take 1 tablet by mouth as needed.     losartan  (COZAAR ) 50 MG tablet TAKE 1 TABLET BY MOUTH EVERY DAY 90 tablet 1   meclizine  (ANTIVERT ) 25 MG tablet Take 1 tablet (25 mg  total) by mouth 3 (three) times daily as needed for dizziness. 30 tablet 0   metFORMIN  (GLUCOPHAGE ) 500 MG tablet TAKE 1 TAB BY MOUTH DAILY WITH BREAKFAST. ANNUAL APPT DUE IN SEPT MUST SEE PROVIDER FOR REFILLS 90 tablet 1   mometasone  (ELOCON ) 0.1 % cream Apply twice a day under the breasts for itching. Use for 7 days. Repeat if the symptoms return. 45 g 1   nystatin  cream (MYCOSTATIN ) Apply 1 Application topically 2 (two) times daily. Apply twice a day under the breasts for infection. Use for 7 days. Repeat if the symptoms return. 30 g 2   ondansetron  (ZOFRAN ) 4 MG tablet Take 1 tablet by mouth as needed.     pravastatin  (PRAVACHOL ) 20 MG tablet TAKE 1 TABLET BY MOUTH EVERYDAY AT BEDTIME 90 tablet 1   No current facility-administered medications on file prior to visit.    Allergies  Allergen Reactions   Sulfa Antibiotics Swelling    Review of Systems No fevers, chills, nausea, muscle aches, no difficulty breathing, no calf pain, no chest pain or shortness of breath.   Physical Exam  GENERAL APPEARANCE: Alert, conversant. Appropriately groomed. No acute distress.   VASCULAR: Pedal pulses palpable 2/4 DP and  PT bilateral.  Capillary refill time is immediate to all digits,  Proximal to distal cooling is warm to warm.  Digital perfusion adequate.   NEUROLOGIC: sensation is intact  to 5.07 monofilament at 5/5 sites bilateral.  Light touch is intact bilateral, vibratory sensation intact bilateral  MUSCULOSKELETAL: acceptable muscle strength, tone and stability bilateral.  No gross boney pedal deformities noted.  No pain, crepitus or limitation noted with foot and ankle range of motion bilateral.   DERMATOLOGIC: skin is warm, supple, and dry.  Right hallux nail has a horizontal split-   no redness, swelling, drainage or sign of infection present.     Assessment     ICD-10-CM   1. Contusion of toenail of right foot, initial encounter  U04.540J        Plan  Exam findings and  treatment recommendations are discussed.  I trimmed the loose portion of the nail away.  A recommended watching the area for any sign of infection.  The nail should grow out normally.

## 2024-01-26 ENCOUNTER — Ambulatory Visit (INDEPENDENT_AMBULATORY_CARE_PROVIDER_SITE_OTHER): Admitting: Nurse Practitioner

## 2024-01-26 ENCOUNTER — Encounter: Payer: Self-pay | Admitting: Nurse Practitioner

## 2024-01-26 VITALS — BP 136/68 | HR 81 | Ht 59.0 in | Wt 150.2 lb

## 2024-01-26 DIAGNOSIS — W57XXXA Bitten or stung by nonvenomous insect and other nonvenomous arthropods, initial encounter: Secondary | ICD-10-CM

## 2024-01-26 DIAGNOSIS — E1169 Type 2 diabetes mellitus with other specified complication: Secondary | ICD-10-CM | POA: Diagnosis not present

## 2024-01-26 DIAGNOSIS — H8110 Benign paroxysmal vertigo, unspecified ear: Secondary | ICD-10-CM

## 2024-01-26 DIAGNOSIS — R42 Dizziness and giddiness: Secondary | ICD-10-CM

## 2024-01-26 DIAGNOSIS — R7989 Other specified abnormal findings of blood chemistry: Secondary | ICD-10-CM

## 2024-01-26 DIAGNOSIS — I10 Essential (primary) hypertension: Secondary | ICD-10-CM | POA: Diagnosis not present

## 2024-01-26 DIAGNOSIS — Z23 Encounter for immunization: Secondary | ICD-10-CM | POA: Diagnosis not present

## 2024-01-26 DIAGNOSIS — E785 Hyperlipidemia, unspecified: Secondary | ICD-10-CM

## 2024-01-26 MED ORDER — DOXYCYCLINE HYCLATE 100 MG PO TABS: 100.0000 mg | ORAL_TABLET | Freq: Two times a day (BID) | ORAL | 0 refills | Status: DC

## 2024-01-26 MED ORDER — PRAVASTATIN SODIUM 20 MG PO TABS: 20.0000 mg | ORAL_TABLET | Freq: Every day | ORAL | 3 refills | Status: AC

## 2024-01-26 MED ORDER — ONDANSETRON HCL 4 MG PO TABS
4.0000 mg | ORAL_TABLET | ORAL | 3 refills | Status: DC | PRN
Start: 2024-01-26 — End: 2024-06-09

## 2024-01-26 MED ORDER — METFORMIN HCL 500 MG PO TABS
ORAL_TABLET | ORAL | 3 refills | Status: AC
Start: 2024-01-26 — End: ?

## 2024-01-26 MED ORDER — MECLIZINE HCL 25 MG PO TABS: 25.0000 mg | ORAL_TABLET | Freq: Three times a day (TID) | ORAL | 3 refills | Status: AC | PRN

## 2024-01-26 MED ORDER — LOSARTAN POTASSIUM 50 MG PO TABS: 50.0000 mg | ORAL_TABLET | Freq: Every day | ORAL | 3 refills | Status: AC

## 2024-01-26 NOTE — Patient Instructions (Signed)
 I have sent in refills of your medications for you today.   I will let you know if we need to make changes based on the labs.  You look fabulous! I am so glad that you have a little reprieve with the change in your friends care.

## 2024-01-27 ENCOUNTER — Encounter: Payer: Self-pay | Admitting: Nurse Practitioner

## 2024-01-27 NOTE — Assessment & Plan Note (Signed)
 Recent insect bite on the breast, likely from a mosquito or similar insect, with no signs of significant infection or abscess currently. Risk of infection exists due to the location and nature of the bite. Area is red. She opted for antibiotics after discussing observation versus a short course of antibiotics to prevent infection. - Prescribe a short course of antibiotics to prevent infection.

## 2024-01-27 NOTE — Assessment & Plan Note (Signed)
 Vertigo with recent exacerbation causing morning nausea, likely involving the right ear due to sleeping position. Previous meclizine  prescription was effective. Discussed restarting meclizine , adding medication for nausea, and Prilosec for associated symptoms. - Prescribe meclizine  for vertigo. - Prescribe medication for nausea. - Prescribe Prilosec for associated symptoms.

## 2024-01-27 NOTE — Progress Notes (Signed)
 Catheline Doing, DNP, AGNP-c [facility]  [address] [place], [place] [address] [phone]  ESTABLISHED PATIENT- Chronic Health and/or Follow-Up Visit  Blood pressure 136/68, pulse 81, height 4' 11 (1.499 m), weight 150 lb 3.2 oz (68.1 kg), last menstrual period 08/05/2003, SpO2 98%.    History of Present Illness Kimberly Welch is a 73 year old female who presents with a bite on her breast and morning nausea.  She noticed a bite on her breast this morning, which she suspects occurred while sitting on her porch. The bite is described as a small area that she initially did not notice and suspects it may have been caused by an insect. She believes that if she had not looked, it might have healed on its own.  She has been experiencing morning nausea, which she associates with her vertigo. Her head feels 'all over the place' and she suspects that sleeping on her right ear might be contributing to her symptoms. She has not taken her meclizine  recently but plans to resume it to see if it alleviates the nausea. She has a history of vertigo and uses meclizine  as needed. She also mentions taking Prilosec, although the frequency and dosage are not specified.  In her social history, she has been busy caring for a friend who was recently moved to a care facility, which has been a significant relief for her.  All ROS negative with exception of what is listed above.   PHYSICAL EXAM Physical Exam Vitals and nursing note reviewed.  Constitutional:      Appearance: Normal appearance.  HENT:     Head: Normocephalic.   Eyes:     Pupils: Pupils are equal, round, and reactive to light.    Cardiovascular:     Rate and Rhythm: Normal rate and regular rhythm.     Pulses: Normal pulses.     Heart sounds: Normal heart sounds.  Pulmonary:     Effort: Pulmonary effort is normal.     Breath sounds: Normal breath sounds.  Chest:    Musculoskeletal:        General: Normal range of  motion.     Cervical back: Normal range of motion.   Skin:    General: Skin is warm.   Neurological:     General: No focal deficit present.     Mental Status: She is alert and oriented to person, place, and time.   Psychiatric:        Mood and Affect: Mood normal.      PLAN Problem List Items Addressed This Visit     Essential hypertension (Chronic)   Blood pressure is well-controlled. Labs are within normal limits. No alarm symptoms present at this time.  - Continue current medications - Encourage increased water intake       Relevant Medications   pravastatin  (PRAVACHOL ) 20 MG tablet   losartan  (COZAAR ) 50 MG tablet   Type 2 diabetes mellitus (HCC) - Primary   Currently managed with diet, exercise, and metformin . Doing exceptionally well historically. No alarm symptoms present today. Will obtain A1c, although, she is concerned about dehydration so she will come back another day this week.      Relevant Medications   pravastatin  (PRAVACHOL ) 20 MG tablet   losartan  (COZAAR ) 50 MG tablet   metFORMIN  (GLUCOPHAGE ) 500 MG tablet   Other Relevant Orders   HgB A1c   Microalbumin / creatinine urine ratio   CBC with Differential/Platelet   Comprehensive metabolic panel with GFR  Hyperlipidemia associated with type 2 diabetes mellitus (HCC)   Chronic.  Currently on statin therapy.  Previous labs reviewed today.  No changes to medication or plan of care at this time.        Relevant Medications   pravastatin  (PRAVACHOL ) 20 MG tablet   losartan  (COZAAR ) 50 MG tablet   metFORMIN  (GLUCOPHAGE ) 500 MG tablet   Bug bite   Recent insect bite on the breast, likely from a mosquito or similar insect, with no signs of significant infection or abscess currently. Risk of infection exists due to the location and nature of the bite. Area is red. She opted for antibiotics after discussing observation versus a short course of antibiotics to prevent infection. - Prescribe a short course of  antibiotics to prevent infection.       Relevant Medications   doxycycline (VIBRA-TABS) 100 MG tablet   Elevated LFTs   Repeat labs today for monitoring.       BPPV (benign paroxysmal positional vertigo)   Vertigo with recent exacerbation causing morning nausea, likely involving the right ear due to sleeping position. Previous meclizine  prescription was effective. Discussed restarting meclizine , adding medication for nausea, and Prilosec for associated symptoms. - Prescribe meclizine  for vertigo. - Prescribe medication for nausea. - Prescribe Prilosec for associated symptoms.      Other Visit Diagnoses       Need for shingles vaccine       Relevant Orders   Varicella zoster antibody, IgG     Vertigo       Relevant Medications   meclizine  (ANTIVERT ) 25 MG tablet   ondansetron  (ZOFRAN ) 4 MG tablet       Return in about 6 months (around 07/27/2024).  Catheline Doing, DNP, AGNP-c

## 2024-01-27 NOTE — Assessment & Plan Note (Signed)
 Blood pressure is well-controlled. Labs are within normal limits. No alarm symptoms present at this time.  - Continue current medications - Encourage increased water intake

## 2024-01-27 NOTE — Assessment & Plan Note (Signed)
 Currently managed with diet, exercise, and metformin . Doing exceptionally well historically. No alarm symptoms present today. Will obtain A1c, although, she is concerned about dehydration so she will come back another day this week.

## 2024-01-27 NOTE — Assessment & Plan Note (Signed)
 Chronic.  Currently on statin therapy.  Previous labs reviewed today.  No changes to medication or plan of care at this time.

## 2024-01-27 NOTE — Assessment & Plan Note (Signed)
 Repeat labs today for monitoring.

## 2024-02-04 ENCOUNTER — Other Ambulatory Visit

## 2024-02-08 ENCOUNTER — Other Ambulatory Visit: Payer: Federal, State, Local not specified - PPO

## 2024-02-15 ENCOUNTER — Ambulatory Visit (HOSPITAL_BASED_OUTPATIENT_CLINIC_OR_DEPARTMENT_OTHER)
Admission: RE | Admit: 2024-02-15 | Discharge: 2024-02-15 | Disposition: A | Discharge status: Home / Self Care | Source: Ambulatory Visit | Attending: Nurse Practitioner | Admitting: Nurse Practitioner

## 2024-02-15 DIAGNOSIS — M8589 Other specified disorders of bone density and structure, multiple sites: Secondary | ICD-10-CM | POA: Insufficient documentation

## 2024-02-15 DIAGNOSIS — Z13820 Encounter for screening for osteoporosis: Secondary | ICD-10-CM | POA: Insufficient documentation

## 2024-02-15 DIAGNOSIS — N951 Menopausal and female climacteric states: Secondary | ICD-10-CM | POA: Insufficient documentation

## 2024-02-24 ENCOUNTER — Ambulatory Visit: Payer: Self-pay | Admitting: Nurse Practitioner

## 2024-06-09 ENCOUNTER — Ambulatory Visit: Admitting: Family Medicine

## 2024-06-09 ENCOUNTER — Other Ambulatory Visit: Payer: Self-pay | Admitting: *Deleted

## 2024-06-09 ENCOUNTER — Encounter: Payer: Self-pay | Admitting: Family Medicine

## 2024-06-09 ENCOUNTER — Ambulatory Visit: Payer: Self-pay

## 2024-06-09 VITALS — BP 130/70 | HR 72 | Temp 98.7°F | Ht 59.0 in | Wt 152.8 lb

## 2024-06-09 DIAGNOSIS — Z23 Encounter for immunization: Secondary | ICD-10-CM

## 2024-06-09 DIAGNOSIS — R002 Palpitations: Secondary | ICD-10-CM

## 2024-06-09 DIAGNOSIS — L304 Erythema intertrigo: Secondary | ICD-10-CM

## 2024-06-09 DIAGNOSIS — Z0000 Encounter for general adult medical examination without abnormal findings: Secondary | ICD-10-CM

## 2024-06-09 DIAGNOSIS — E1169 Type 2 diabetes mellitus with other specified complication: Secondary | ICD-10-CM

## 2024-06-09 DIAGNOSIS — L02412 Cutaneous abscess of left axilla: Secondary | ICD-10-CM | POA: Diagnosis not present

## 2024-06-09 LAB — POCT GLYCOSYLATED HEMOGLOBIN (HGB A1C): Hemoglobin A1C: 6 % — AB (ref 4.0–5.6)

## 2024-06-09 MED ORDER — DOXYCYCLINE HYCLATE 100 MG PO TABS: 100.0000 mg | ORAL_TABLET | Freq: Two times a day (BID) | ORAL | 0 refills | Status: DC

## 2024-06-09 MED ORDER — NYSTATIN 100000 UNIT/GM EX CREA
1.0000 | TOPICAL_CREAM | Freq: Two times a day (BID) | CUTANEOUS | 2 refills | Status: AC
Start: 2024-06-09 — End: ?

## 2024-06-09 NOTE — Progress Notes (Signed)
 Chief Complaint  Patient presents with   Arm Pain    Left arm pain(couple days-comes and goes) and rash under both breasts(1 week).    Kimberly Welch is a 73 year old female who presents with left arm pain and a rash under both breasts.  She experiences dull soreness in the middle of the left upper arm for the past couple of days. The pain is intermittent and not exacerbated by movement. She has had change in activity, recently changing her closet out, moving/lifting a lot of clothing. She is right handed, but has been using both arms to lift. . No medication has been taken for the pain.  A rash under both breasts has been present for a week, with itching and burning. She hasn't used any medications. She previously had creams, but has been out (per chart, mometasone  and nystatin  previously prescribed).  Knots under both arms appeared after a change in deodorant, improved with discontinuation, but today noted a painful lump in the left axilla.  Diabetes--She takes metformin  for diabetes but misses doses occasionally. Blood sugar levels have not been checked recently. She last saw PCP in June, but never got the labs done that were ordered.  Last A1c was a year ago, 6.5%. Blood work is scheduled for tomorrow (that was ordered for June visit). She has CPE scheduled with PCP on 11/19.     PMH, PSH, SH reviewed  DM, HTN, HLD, GERD Cancer of appendix (had recent f/u, which was all good).  Lab Results  Component Value Date   HGBA1C 6.5 (H) 06/15/2023    Outpatient Encounter Medications as of 06/09/2024  Medication Sig Note   albuterol (VENTOLIN HFA) 108 (90 Base) MCG/ACT inhaler Inhale 2 puffs into the lungs every 6 (six) hours as needed. 06/09/2024: As needed   Artificial Tear Ointment (DRY EYES OP) Place 1 drop into both eyes daily as needed (dry eyes).    aspirin EC 81 MG tablet Take 162 mg by mouth daily as needed (when eat fried foods). Swallow whole. 06/09/2024: As needed   ibuprofen   (ADVIL ) 200 MG tablet Take 1 tablet by mouth as needed. 06/09/2024: As needed   losartan  (COZAAR ) 50 MG tablet Take 1 tablet (50 mg total) by mouth daily.    meclizine  (ANTIVERT ) 25 MG tablet Take 1 tablet (25 mg total) by mouth 3 (three) times daily as needed for dizziness. 06/09/2024: As needed   metFORMIN  (GLUCOPHAGE ) 500 MG tablet TAKE 1 TAB BY MOUTH DAILY WITH BREAKFAST. 06/09/2024: She missed a few days last week, didn't take it today   mometasone  (ELOCON ) 0.1 % cream Apply twice a day under the breasts for itching. Use for 7 days. Repeat if the symptoms return. 06/09/2024: As needed, ran out   nystatin  cream (MYCOSTATIN ) Apply 1 Application topically 2 (two) times daily. Apply twice a day under the breasts for infection. Use for 7 days. Repeat if the symptoms return. 06/09/2024: As needed, ran out   pravastatin  (PRAVACHOL ) 20 MG tablet Take 1 tablet (20 mg total) by mouth daily.    [DISCONTINUED] doxycycline  (VIBRA -TABS) 100 MG tablet Take 1 tablet (100 mg total) by mouth 2 (two) times daily.    [DISCONTINUED] ondansetron  (ZOFRAN ) 4 MG tablet Take 1 tablet (4 mg total) by mouth as needed.    No facility-administered encounter medications on file as of 06/09/2024.   Allergies  Allergen Reactions   Sulfa Antibiotics Swelling     ROS: no fever, chills.  Recent sniffles, resolved. No cough, shortness  of breath. No chest pain. No n/v/heartburn/indigestion or bowel changes. No neck pain, numbness, tingling or weakness     PHYSICAL EXAM:  BP 130/70   Pulse 72   Temp 98.7 F (37.1 C) (Tympanic)   Ht 4' 11 (1.499 m)   Wt 152 lb 12.8 oz (69.3 kg)   LMP 08/05/2003   BMI 30.86 kg/m   Wt Readings from Last 3 Encounters:  06/09/24 152 lb 12.8 oz (69.3 kg)  01/26/24 150 lb 3.2 oz (68.1 kg)  09/09/23 154 lb 6.4 oz (70 kg)   Well-appearing, pleasant female, in no distress HEENT: conjunctiva and sclera are clear, EOMI Neck: no lymphadenoapthy or mass. No c-spine tenderness Back: no  spinal or CVA tenderness Heart: regular rate and rhythm Lungs: clear bilaterally Extremities: no edema, normal pulses.  She has no tenderness at LUE. Has some discomfort with tricep stretch. Normal strength, sensation. Skin: She is not wearing bra, it is moist below breasts bilaterally where there is skin-skin contact. Slightly pink on the right. Not pink on the left. Some hyperpigmentation bilaterally. No satellite papules/pustules. L axilla--early boil, mildly erythematous and tender. R axilla--some skin tags, no masses, nontender. Abdomen: soft, nontender Psych: normal mood, affect, hygiene and grooming Neuro: alert and oriented. Normal strength, gait. Cranial nerves grossly intact   Lab Results  Component Value Date   HGBA1C 6.0 (A) 06/09/2024     ASSESSMENT/PLAN:  Intertrigo - mild, under breasts. no e/o candida.  Keep dry. RF nystatin  to have on hand in case it is worse - Plan: nystatin  cream (MYCOSTATIN )  Abscess of axilla, left - warm compresses, doxycycline .  f/u if increasing size/pain, fever, for I&D - Plan: doxycycline  (VIBRA -TABS) 100 MG tablet  Type 2 diabetes mellitus with other specified complication, without long-term current use of insulin  (HCC) - well controlled. Encouraged daily use of metformin . f/u for labs and visit with PCP as scheduled.  Added needed labs to orders for tomorrow - Plan: HgB A1c  Need for influenza vaccination - Plan: Flu vaccine HIGH DOSE PF(Fluzone Trivalent)  Coming tomorrow for labs, with orders for labs that were expected in June. .  A1c done today. Added TSH and lipids to orders for tomorrow, so PCP will have them for CPE.   Cutaneous abscess (early boil) of left axilla, mildly erythematous and tender. - Prescribed doxycycline  100 mg, one pill twice a day for 10 days. - Apply warm compresses to the area at least three times per day. - Advised to avoid sun exposure or use sunscreen while taking doxycycline . - Instructed to contact the  clinic if fever, significantly larger lump, or more pain develops.  Intertrigo under breasts Mild intertrigo under the breasts, likely due to moisture and skin-on-skin contact. - Advised to keep the skin under the breasts dry. - Recommended wearing a cotton bra to prevent skin-on-skin contact. - Use powder to absorb moisture. - Prescribed nystatin  cream as a refill, to be used if increased redness, itching, or bumps develop. - Provided a handout on intertrigo for further information.  Type 2 diabetes mellitus Type 2 diabetes mellitus, well-controlled with an A1c of 6%. - Continue metformin  as prescribed, one pill once a day with breakfast. - Scheduled follow-up appointment with Lauraine Hun on November 19th for diabetes management.  Recording duration: 32 minutes

## 2024-06-09 NOTE — Patient Instructions (Addendum)
 It is important to keep the skin below the breasts dry. Wearing a cotton bra, to keep the skin from touching the skin and perspiring, can help. Using powder to absorb moisture can help.  I do not see evidence of a yeast infection at the moment (but this can develop if not careful). I'm prescribing a refill of the nystatin  cream--use this if you have increased redness and itching, especially if you notice bumps forming under the breast. Hopefully using powder and keeping the area as dry as possible will help prevent the yeast infection from occurring.  You also have the start of a boil at your left armpit. Take the antibiotics as directed and apply warm compresses to the area at least 3x per day. Contact us  if you develop fever, significantly larger lump or more pain--sometimes these need to be lanced.   Avoid the sun, or use sunscreen while taking the antibiotic (it can cause sun-sensitive rash/burns).

## 2024-06-09 NOTE — Telephone Encounter (Signed)
 This RN attempted to reach patient to triage symptoms. VM left to call back. Attempt #1  Copied from CRM [id number]. Topic: Clinical - Red Word Triage >> Jun 09, 2024  9:41 AM Tinnie BROCKS wrote: Red Word that prompted transfer to Nurse Triage: Pain in left arm comes and goes. Rates pain 3/10, concerned because she has family history of heart problems  Also, rash under breast x 1 week

## 2024-06-09 NOTE — Telephone Encounter (Signed)
 I called pt and moved her to appt today.

## 2024-06-09 NOTE — Telephone Encounter (Signed)
 FYI Only or Action Required?: FYI only for provider: appointment scheduled on 11.10.25.  Patient was last seen in primary care on 01/26/2024 by Early, Camie BRAVO, NP.  Called Nurse Triage reporting Pain.  Symptoms began several weeks ago.  Interventions attempted: Nothing.  Symptoms are: unchanged.  Triage Disposition: See PCP Within 2 Weeks  Patient/caregiver understands and will follow disposition?: Yes     Reason for Disposition  [1] MILD pain (e.g., does not interfere with normal activities) AND [2] present > 7 days  Answer Assessment - Initial Assessment Questions Pt states she has been having upper arm pain and thought it would go away but is worried because she has a family hx of heart dz. She states it's in the back upper part of her arm. Does not radiate into shoulder or down arm. States it's very intermittent. Thought maybe her muscle was tight so has tried rubbing int.    1. ONSET: When did the pain start?     About 2 weeks ago 2. LOCATION: Where is the pain located?     Back of right upper arm 3. PAIN: How bad is the pain? (Scale 0-10; or none, mild, moderate, severe)     3/10 4. WORK OR EXERCISE: Has there been any recent work or exercise that involved this part of the body?     no 5. CAUSE: What do you think is causing the arm pain?     no 6. OTHER SYMPTOMS: Do you have any other symptoms? (e.g., neck pain, swelling, rash, fever, numbness, weakness)     no  Protocols used: Arm Pain-A-AH

## 2024-06-10 ENCOUNTER — Other Ambulatory Visit

## 2024-06-10 DIAGNOSIS — E119 Type 2 diabetes mellitus without complications: Secondary | ICD-10-CM

## 2024-06-10 DIAGNOSIS — E1169 Type 2 diabetes mellitus with other specified complication: Secondary | ICD-10-CM

## 2024-06-10 DIAGNOSIS — Z0000 Encounter for general adult medical examination without abnormal findings: Secondary | ICD-10-CM

## 2024-06-11 LAB — LIPID PANEL
Chol/HDL Ratio: 4 ratio (ref 0.0–4.4)
Cholesterol, Total: 183 mg/dL (ref 100–199)
HDL: 46 mg/dL (ref >39)
LDL Chol Calc (NIH): 113 mg/dL — ABNORMAL HIGH (ref 0–99)
Triglycerides: 137 mg/dL (ref 0–149)
VLDL Cholesterol Cal: 24 mg/dL (ref 5–40)

## 2024-06-11 LAB — CBC WITH DIFFERENTIAL/PLATELET
Basophils Absolute: 0.1 x10E3/uL (ref 0.0–0.2)
Basos: 1 %
EOS (ABSOLUTE): 0.3 x10E3/uL (ref 0.0–0.4)
Eos: 4 %
Hematocrit: 35.3 % (ref 34.0–46.6)
Hemoglobin: 11.5 g/dL (ref 11.1–15.9)
Immature Grans (Abs): 0 x10E3/uL (ref 0.0–0.1)
Immature Granulocytes: 0 %
Lymphocytes Absolute: 2.5 x10E3/uL (ref 0.7–3.1)
Lymphs: 31 %
MCH: 29.8 pg (ref 26.6–33.0)
MCHC: 32.6 g/dL (ref 31.5–35.7)
MCV: 92 fL (ref 79–97)
Monocytes Absolute: 0.7 x10E3/uL (ref 0.1–0.9)
Monocytes: 8 %
Neutrophils Absolute: 4.5 x10E3/uL (ref 1.4–7.0)
Neutrophils: 56 %
Platelets: 284 x10E3/uL (ref 150–450)
RBC: 3.86 x10E6/uL (ref 3.77–5.28)
RDW: 13 % (ref 11.7–15.4)
WBC: 8 x10E3/uL (ref 3.4–10.8)

## 2024-06-11 LAB — COMPREHENSIVE METABOLIC PANEL WITH GFR
ALT: 9 IU/L (ref 0–32)
AST: 15 IU/L (ref 0–40)
Albumin: 4.6 g/dL (ref 3.8–4.8)
Alkaline Phosphatase: 100 IU/L (ref 49–135)
BUN/Creatinine Ratio: 13 (ref 12–28)
BUN: 12 mg/dL (ref 8–27)
Bilirubin Total: 0.4 mg/dL (ref 0.0–1.2)
CO2: 25 mmol/L (ref 20–29)
Calcium: 9.5 mg/dL (ref 8.7–10.3)
Chloride: 99 mmol/L (ref 96–106)
Creatinine, Ser: 0.95 mg/dL (ref 0.57–1.00)
Globulin, Total: 2.6 g/dL (ref 1.5–4.5)
Glucose: 103 mg/dL — ABNORMAL HIGH (ref 70–99)
Potassium: 4.2 mmol/L (ref 3.5–5.2)
Sodium: 138 mmol/L (ref 134–144)
Total Protein: 7.2 g/dL (ref 6.0–8.5)
eGFR: 63 mL/min/1.73

## 2024-06-11 LAB — MICROALBUMIN / CREATININE URINE RATIO
Creatinine, Urine: 18 mg/dL
Microalb/Creat Ratio: <17 mg/g{creat} (ref 0–29)
Microalbumin, Urine: <3 ug/mL

## 2024-06-11 LAB — VARICELLA ZOSTER ANTIBODY, IGG: Varicella zoster IgG: REACTIVE

## 2024-06-11 LAB — TSH: TSH: 1.83 u[IU]/mL (ref 0.450–4.500)

## 2024-06-13 ENCOUNTER — Ambulatory Visit: Admitting: Family Medicine

## 2024-06-13 ENCOUNTER — Ambulatory Visit: Payer: Self-pay | Admitting: Nurse Practitioner

## 2024-06-14 ENCOUNTER — Telehealth: Payer: Self-pay | Admitting: Family Medicine

## 2024-06-14 NOTE — Telephone Encounter (Signed)
 Copied from CRM [id number]. Topic: General - Other >> Jun 14, 2024  9:43 AM Berwyn MATSU wrote: Reason for CRM: patient called in today returning CMA adams call.   I called CAL who advised they would check if he is available. Per cal he is with a patient.   I advise caller I would send message to have him return his call.   May you please assist.

## 2024-06-21 NOTE — Progress Notes (Unsigned)
 Covid +

## 2024-06-22 ENCOUNTER — Encounter: Payer: Self-pay | Admitting: Nurse Practitioner

## 2024-06-22 ENCOUNTER — Ambulatory Visit: Payer: Federal, State, Local not specified - PPO | Admitting: Nurse Practitioner

## 2024-06-22 ENCOUNTER — Ambulatory Visit: Payer: Self-pay | Admitting: Nurse Practitioner

## 2024-06-22 VITALS — BP 126/82 | HR 80 | Ht 58.5 in | Wt 151.0 lb

## 2024-06-22 DIAGNOSIS — E1169 Type 2 diabetes mellitus with other specified complication: Secondary | ICD-10-CM | POA: Diagnosis not present

## 2024-06-22 DIAGNOSIS — H409 Unspecified glaucoma: Secondary | ICD-10-CM | POA: Diagnosis not present

## 2024-06-22 DIAGNOSIS — Z0000 Encounter for general adult medical examination without abnormal findings: Secondary | ICD-10-CM

## 2024-06-22 DIAGNOSIS — B379 Candidiasis, unspecified: Secondary | ICD-10-CM

## 2024-06-22 DIAGNOSIS — B372 Candidiasis of skin and nail: Secondary | ICD-10-CM

## 2024-06-22 DIAGNOSIS — E785 Hyperlipidemia, unspecified: Secondary | ICD-10-CM

## 2024-06-22 DIAGNOSIS — E1159 Type 2 diabetes mellitus with other circulatory complications: Secondary | ICD-10-CM

## 2024-06-22 DIAGNOSIS — I152 Hypertension secondary to endocrine disorders: Secondary | ICD-10-CM

## 2024-06-22 MED ORDER — FLUCONAZOLE 150 MG PO TABS: ORAL_TABLET | ORAL | 0 refills | Status: AC

## 2024-06-22 MED ORDER — MOMETASONE FUROATE 0.1 % EX CREA: TOPICAL_CREAM | CUTANEOUS | 1 refills | Status: AC

## 2024-06-22 NOTE — Assessment & Plan Note (Signed)
 Seeing Dr. Lita at [facility] managing. Using bilateral eye drops at bedtime. Working on improving adherence with putting eye drops on nightstand. We discussed the importance of continuing these on a regular basis to help reduce risks of advancement.  - Keep your drops on the nightstand so you don't forget to take these at bedtime.

## 2024-06-22 NOTE — Patient Instructions (Signed)
 You are doing WONDERFUL on your blood sugar control!! I am SO proud of you!  Keep working on taking your medication at bedtime every night.   I sent in refill of the cream for you to use under your breasts. I also sent in a pill for you to take to help clear this up. Take one today and the other one in three days.

## 2024-06-22 NOTE — Assessment & Plan Note (Signed)
 Hypertension associated with diabetes. Current therapy: losartan  50. Prescribed daily. Current BP is controlled. is tolerating medication well. is managing schedule of medication. Lab monitoring is up to date. - Monitor BP at home as directed or if you begin to have symptoms. If your blood pressure readings are consistently higher than 135/85 please let us  know.  - Low fat, low card diet with daily exercise strongly encouraged.  - Continue medication management daily.  - F/U DM 4-5 mo

## 2024-06-22 NOTE — Assessment & Plan Note (Signed)
 Hyperlipidemia associated with diabetes. Current therapy: pravastatin  (Pravachor) 20mg  . Prescribed daily. Last lipids waxing and waning. Tolerating statin well. Managing inconsistent schedule of medication.  - Monitor lipid levels annually, or more frequently if abnormal.  - Continue statin therapy at current dose and frequency - Low fat, low carb diet with daily exercise strongly encouraged.  - F/U DM 4-72mo

## 2024-06-22 NOTE — Assessment & Plan Note (Signed)
 Ongoing symptoms of erythema and pruritus under the breasts bilaterally. She reports this has improved with the use of topical treatment, but not completely resolved. Will start treatment with fluconazole  to help aid in improvement of symptoms.  - Start the fluconazole  today and take second dose 3 days later.  - If symptoms are not better in 2 weeks, please let me know.  - I sent a refill on the mometasone  to help with itching.

## 2024-06-22 NOTE — Assessment & Plan Note (Signed)
 CPE completed today. Review of HM activities and recommendations discussed and provided on AVS. Anticipatory guidance, diet, and exercise recommendations provided. Medications, allergies, and hx reviewed and updated as necessary. Orders placed as listed below.  Plan: - Labs discussed. Will make changes as necessary based on results.  - I will review these results and send recommendations via MyChart or a telephone call.  - F/U with CPE in 1 year or sooner for acute/chronic health needs as directed.

## 2024-06-22 NOTE — Assessment & Plan Note (Signed)
 Blood sugars well controlled with current A1c at 6.0%. No concerns with neuropathic pain, increased hunger, thirst, or urination. Managed with Metformin  500mg  daily. Recommend continuation of current medication management. Keep working on exercise and diet to help with management.  F/U in 4-5 months

## 2024-11-07 ENCOUNTER — Ambulatory Visit: Admitting: Nurse Practitioner

## 2024-12-20 ENCOUNTER — Ambulatory Visit: Payer: Self-pay

## 2025-06-27 ENCOUNTER — Encounter: Admitting: Nurse Practitioner
